# Patient Record
Sex: Female | Born: 1980
Health system: Southern US, Community
[De-identification: ages and names within clinical notes are randomized; demographics above are authoritative.]

## PROBLEM LIST (undated history)

## (undated) DIAGNOSIS — M2550 Pain in unspecified joint: Secondary | ICD-10-CM

## (undated) DIAGNOSIS — R079 Chest pain, unspecified: Secondary | ICD-10-CM

## (undated) DIAGNOSIS — I10 Essential (primary) hypertension: Secondary | ICD-10-CM

## (undated) DIAGNOSIS — E559 Vitamin D deficiency, unspecified: Secondary | ICD-10-CM

## (undated) DIAGNOSIS — G35D Multiple sclerosis, unspecified: Secondary | ICD-10-CM

## (undated) DIAGNOSIS — R1310 Dysphagia, unspecified: Secondary | ICD-10-CM

## (undated) DIAGNOSIS — Z9289 Personal history of other medical treatment: Secondary | ICD-10-CM

## (undated) DIAGNOSIS — N979 Female infertility, unspecified: Secondary | ICD-10-CM

## (undated) DIAGNOSIS — D649 Anemia, unspecified: Secondary | ICD-10-CM

## (undated) DIAGNOSIS — R0602 Shortness of breath: Secondary | ICD-10-CM

## (undated) DIAGNOSIS — R600 Localized edema: Secondary | ICD-10-CM

## (undated) DIAGNOSIS — I509 Heart failure, unspecified: Secondary | ICD-10-CM

## (undated) DIAGNOSIS — J189 Pneumonia, unspecified organism: Secondary | ICD-10-CM

## (undated) DIAGNOSIS — G35 Multiple sclerosis: Secondary | ICD-10-CM

## (undated) HISTORY — DX: Female infertility, unspecified: N97.9

## (undated) HISTORY — DX: Pain in unspecified joint: M25.50

## (undated) HISTORY — DX: Chest pain, unspecified: R07.9

## (undated) HISTORY — DX: Vitamin D deficiency, unspecified: E55.9

## (undated) HISTORY — DX: Shortness of breath: R06.02

## (undated) HISTORY — DX: Dysphagia, unspecified: R13.10

## (undated) HISTORY — DX: Localized edema: R60.0

---

## 2015-05-16 ENCOUNTER — Other Ambulatory Visit (HOSPITAL_COMMUNITY): Payer: Self-pay | Admitting: Psychiatry

## 2015-05-16 DIAGNOSIS — G35 Multiple sclerosis: Secondary | ICD-10-CM

## 2015-05-29 ENCOUNTER — Ambulatory Visit (HOSPITAL_COMMUNITY)
Admission: RE | Admit: 2015-05-29 | Discharge: 2015-05-29 | Disposition: A | Discharge status: Home / Self Care | Payer: PRIVATE HEALTH INSURANCE | Source: Ambulatory Visit | Attending: Psychiatry | Admitting: Psychiatry

## 2015-05-29 ENCOUNTER — Encounter (HOSPITAL_COMMUNITY): Payer: Self-pay

## 2015-05-29 DIAGNOSIS — G35 Multiple sclerosis: Secondary | ICD-10-CM | POA: Diagnosis not present

## 2015-05-29 HISTORY — DX: Essential (primary) hypertension: I10

## 2015-05-29 LAB — CREATININE, SERUM
Creatinine, Ser: 0.71 mg/dL (ref 0.44–1.00)
GFR calc Af Amer: >60 mL/min (ref >60)
GFR calc non Af Amer: >60 mL/min (ref >60)

## 2015-05-29 MED ORDER — GADOBENATE DIMEGLUMINE 529 MG/ML IV SOLN
20.0000 mL | Freq: Once | INTRAVENOUS | Status: AC | PRN
  Administered 2015-05-29: 20 mL via INTRAVENOUS

## 2017-08-12 DIAGNOSIS — Z9289 Personal history of other medical treatment: Secondary | ICD-10-CM

## 2017-08-12 HISTORY — DX: Personal history of other medical treatment: Z92.89

## 2017-09-04 ENCOUNTER — Other Ambulatory Visit: Payer: Self-pay

## 2017-09-04 ENCOUNTER — Inpatient Hospital Stay (HOSPITAL_COMMUNITY)
Admission: EM | Admit: 2017-09-04 | Discharge: 2017-09-07 | DRG: 760 | Disposition: A | Discharge status: Home / Self Care | Payer: PRIVATE HEALTH INSURANCE | Attending: Family Medicine | Admitting: Family Medicine

## 2017-09-04 ENCOUNTER — Encounter (HOSPITAL_COMMUNITY): Payer: Self-pay

## 2017-09-04 DIAGNOSIS — D259 Leiomyoma of uterus, unspecified: Secondary | ICD-10-CM | POA: Diagnosis not present

## 2017-09-04 DIAGNOSIS — Z833 Family history of diabetes mellitus: Secondary | ICD-10-CM

## 2017-09-04 DIAGNOSIS — F419 Anxiety disorder, unspecified: Secondary | ICD-10-CM | POA: Diagnosis present

## 2017-09-04 DIAGNOSIS — D649 Anemia, unspecified: Secondary | ICD-10-CM | POA: Diagnosis present

## 2017-09-04 DIAGNOSIS — Z6841 Body Mass Index (BMI) 40.0 and over, adult: Secondary | ICD-10-CM

## 2017-09-04 DIAGNOSIS — N920 Excessive and frequent menstruation with regular cycle: Secondary | ICD-10-CM

## 2017-09-04 DIAGNOSIS — Z8249 Family history of ischemic heart disease and other diseases of the circulatory system: Secondary | ICD-10-CM

## 2017-09-04 DIAGNOSIS — I10 Essential (primary) hypertension: Secondary | ICD-10-CM | POA: Diagnosis present

## 2017-09-04 DIAGNOSIS — N924 Excessive bleeding in the premenopausal period: Secondary | ICD-10-CM

## 2017-09-04 DIAGNOSIS — Z79899 Other long term (current) drug therapy: Secondary | ICD-10-CM

## 2017-09-04 DIAGNOSIS — G35 Multiple sclerosis: Secondary | ICD-10-CM | POA: Diagnosis not present

## 2017-09-04 DIAGNOSIS — R000 Tachycardia, unspecified: Secondary | ICD-10-CM | POA: Diagnosis present

## 2017-09-04 DIAGNOSIS — D500 Iron deficiency anemia secondary to blood loss (chronic): Secondary | ICD-10-CM | POA: Diagnosis present

## 2017-09-04 HISTORY — DX: Multiple sclerosis: G35

## 2017-09-04 HISTORY — DX: Multiple sclerosis, unspecified: G35.D

## 2017-09-04 LAB — CBC
HCT: 24.5 % — ABNORMAL LOW (ref 36.0–46.0)
Hemoglobin: 6.1 g/dL — CL (ref 12.0–15.0)
MCH: 15.4 pg — ABNORMAL LOW (ref 26.0–34.0)
MCHC: 24.9 g/dL — ABNORMAL LOW (ref 30.0–36.0)
MCV: 61.9 fL — ABNORMAL LOW (ref 78.0–100.0)
Platelets: 240 10*3/uL (ref 150–400)
RBC: 3.96 MIL/uL (ref 3.87–5.11)
RDW: 19.7 % — ABNORMAL HIGH (ref 11.5–15.5)
WBC: 5.2 10*3/uL (ref 4.0–10.5)

## 2017-09-04 LAB — COMPREHENSIVE METABOLIC PANEL
ALT: 21 U/L (ref 14–54)
AST: 31 U/L (ref 15–41)
Albumin: 3.7 g/dL (ref 3.5–5.0)
Alkaline Phosphatase: 36 U/L — ABNORMAL LOW (ref 38–126)
Anion gap: 10 (ref 5–15)
BUN: 13 mg/dL (ref 6–20)
CO2: 20 mmol/L — ABNORMAL LOW (ref 22–32)
Calcium: 8.9 mg/dL (ref 8.9–10.3)
Chloride: 108 mmol/L (ref 101–111)
Creatinine, Ser: 0.69 mg/dL (ref 0.44–1.00)
GFR calc Af Amer: >60 mL/min (ref >60)
GFR calc non Af Amer: >60 mL/min (ref >60)
Glucose, Bld: 85 mg/dL (ref 65–99)
Potassium: 3.9 mmol/L (ref 3.5–5.1)
Sodium: 138 mmol/L (ref 135–145)
Total Bilirubin: 0.6 mg/dL (ref 0.3–1.2)
Total Protein: 7.7 g/dL (ref 6.5–8.1)

## 2017-09-04 LAB — PREPARE RBC (CROSSMATCH)

## 2017-09-04 LAB — I-STAT BETA HCG BLOOD, ED (MC, WL, AP ONLY): I-stat hCG, quantitative: <5 m[IU]/mL (ref <5)

## 2017-09-04 LAB — ABO/RH: ABO/RH(D): A POS

## 2017-09-04 LAB — POC OCCULT BLOOD, ED: Fecal Occult Bld: NEGATIVE

## 2017-09-04 MED ORDER — SODIUM CHLORIDE 0.9 % IV SOLN
10.0000 mL/h | Freq: Once | INTRAVENOUS | Status: AC
  Administered 2017-09-04: 10 mL/h via INTRAVENOUS

## 2017-09-04 NOTE — H&P (Signed)
[facility] Admission History and Physical Service Pager: [phone]  Patient name: Amanda [name] Medical record number: [id number] Date of birth: 1981/01/26 Age: 37 y.o. Gender: female  Primary Care Provider: Derek Jack, MD (Inactive) Consultants: none Code Status: full  Chief Complaint: symptomatic anemia  Assessment and Plan: Amanda [name] is a 37 y.o. female presenting with symptomatic anemia with a hemoglobin of 6.1. PMH is significant for multiple sclerosis, obesity.  Symptomatic microcytic anemia: Patient found to have Hgb 6.1 at routine clinic visit and sent to ED.  She has had fatigue for the past month and reports menorrhagia last month.  Unlikely GI source as FOBT negative.  No falls or pain to suggest occult intraabdominal source. Patient has a sister who has had menorrhagia and early hysterectomy, unclear if due to fibroids or other cause.  Patient's last pap smear was two years ago and was normal.  She denies history of abnormal pap smears.  Pelvic exam negative for vaginal lacerations, although cervix was not visualized due to patient intolerance. If additional pelvic exam or [place] is needed, patient may require premedication with benzodiazepines. No blood in vaginal vault.  Given patient's age and menorrhagia, likely source of anemia is uterine bleeding likely due to fibroids, although adenomyosis or a cervical lesion are also possible.  Low MCV supports chronic iron-deficiency anemia.  No pancytopenia to suggest bone marrow dysfunction. -observe on med surg, Dr. Mingo Amber attending - transfuse one unit pRBC - f/u post-transfusion CBC - iron panel  - ferritin  - TSH due to tachycardia and recent weight loss, although anemia could cause tachycardia and weight loss was intentional - consider pelvic US to assess fibroids and endometrial stripe - consider starting OCPs vs megace - Fe supplementation with bowel regimen at discharge  Multiple Sclerosis:  Patient was diagnosed in 2009 and sees Dr. Baird Kay, next appt in March. Last relapse was in 2016.  Patient currently experiences some dysarthria, joint pain, and tingling in her lower extremities.  Takes teriflunomide (Aubagio) 14 mg daily. - continue Aubagio   FEN/GI: regular diet Prophylaxis: lovenox  Disposition: admit to med-surg, attending Dr. Mingo Amber  History of Present Illness:  Amanda [name] is a 37 y.o. female presenting with symptomatic anemia.  She reports a history of fatigue for the past three months, with some dizziness causing her to take breaks at work.  She also notes that her previous period was much heavier than normal, with patient needing to use 4 super pads in one day and having gushing with urination.  This period also lasted longer than normal at 6-7 days.  She also says that she experienced significant cramping with her last period.  Usually, the first 1-2 days of her periods are heavy, but they are light after that.  Her periods are regular and occur at monthly intervals.  They have been normal except for her last one.  She does not take birth control because she was told that her husband was not able to have children.  She says that she has been diagnosed with anemia before and was prescribed iron supplements, but she stopped taking them after they made her constipated.  Her last pap smear was two years ago and was normal, as have been all of her previous pap smears.  She says that her weight has decreased from 336 lbs to 290 due to better control of portion sizes.   Review Of Systems: Per HPI with the following additions:   Review of Systems  Constitutional: Positive for malaise/fatigue and weight loss. Negative for chills.  HENT: Negative for congestion.   Eyes: Negative for double vision.  Respiratory: Negative for cough.   Cardiovascular: Positive for chest pain and palpitations.       Chest pain is right sided and related to arm movement and deep breathing   Gastrointestinal: Positive for constipation. Negative for abdominal pain, diarrhea, melena, nausea and vomiting.  Genitourinary: Negative for dysuria.  Musculoskeletal: Negative for falls and myalgias.  Neurological: Positive for dizziness. Negative for headaches.  Endo/Heme/Allergies: Does not bruise/bleed easily.       Endorses frequently feeling cold     Patient Active Problem List   Diagnosis Date Noted  . Symptomatic anemia   . Multiple sclerosis ([place])   . Morbid obesity ([place])   . Excessive bleeding in premenopausal period     Past Medical History: Past Medical History:  Diagnosis Date  . BP (high blood pressure)   . Multiple sclerosis ([place])     Past Surgical History: History reviewed. No pertinent surgical history.  Social History: Social History   Tobacco Use  . Smoking status: Never Smoker  . Smokeless tobacco: Never Used  Substance Use Topics  . Alcohol use: No    Frequency: Never  . Drug use: No   Additional social history: lives with husband  Please also refer to relevant sections of EMR.  Family History: Family History  Problem Relation Age of Onset  . Diabetes Mother   . Hypertension Mother   . Fibroids Sister    (If not completed, MUST add something in)  Allergies and Medications: No Known Allergies No current facility-administered medications on file prior to encounter.    Current Outpatient Medications on File Prior to Encounter  Medication Sig Dispense Refill  . Teriflunomide (AUBAGIO) 14 MG TABS Take 14 mg by mouth daily.      Objective: BP 117/67   Pulse 93   Temp 98.7 F (37.1 C)   Resp 16   LMP 08/15/2017 (Within Days)   SpO2 100%  Physical Exam  Constitutional: She is oriented to person, place, and time. She appears well-developed and well-nourished. No distress.  HENT:  Head: Normocephalic and atraumatic.  Nose: Nose normal.  Eyes: Conjunctivae and EOM are normal.  Neck: Normal range of motion.  Cardiovascular: Normal  rate, regular rhythm and normal heart sounds.  No murmur heard. Pulmonary/Chest: Effort normal and breath sounds normal. No respiratory distress.  Abdominal: Soft. Bowel sounds are normal. There is no tenderness.  Genitourinary: Vagina normal and uterus normal. No bleeding in the vagina. No vaginal discharge found.  Genitourinary Comments: No vaginal lacerations or blood in vaginal vault; could not visualize cervix due to patient intolerance; no masses felt on manual exam but limited by obesity  Lymphadenopathy:    She has no cervical adenopathy.  Neurological: She is alert and oriented to person, place, and time. No cranial nerve deficit. Coordination normal.  Skin: Skin is warm and dry. She is not diaphoretic. There is pallor.  Psychiatric: She has a normal mood and affect. Her behavior is normal. Judgment and thought content normal.  Very anxious during pelvic exam    Labs and Imaging: CBC BMET  Recent Labs  Lab 09/04/17 1904  WBC 5.2  HGB 6.1*  HCT 24.5*  PLT 240   Recent Labs  Lab 09/04/17 1904  NA 138  K 3.9  CL 108  CO2 20*  BUN 13  CREATININE 0.69  GLUCOSE 85  CALCIUM  8.9      Kathrene Alu, MD 09/05/2017, 2:31 AM PGY-1, [place] Intern pager: [phone], text pages welcome  FPTS Upper-Level Resident Addendum  I have independently interviewed and examined the patient. I have discussed the above with the original author and agree with their documentation. My edits for correction/addition/clarification are in blue. Please see also any attending notes.   Ralene Ok, MD PGY-2, [place] Service pager: [phone] (text pages welcome through [facility])

## 2017-09-04 NOTE — ED Provider Notes (Signed)
I saw and evaluated the patient, reviewed the resident's note and I agree with the findings and plan.  Pertinent History: fatigue over several months - routine physical with labs - told she had very low hgb and to come to hospital.  She has had hgb of 6 - no hx of GI bleeding - has had a couple of months of heavy menses.  Pertinent Exam findings: tachycardic, no abd ttp, lungs clear, no edema.  Obese  Needs admission for transfusion  The patient has required a blood transfusion in the emergency department for ongoing tachycardia and a severe anemia.  Critical care provided for this very anemic patient.  She will be admitted to the hospital under the medical service, the physicians are at the bedside at this time.  Rectal exam showed no gross blood per resident note.  .Critical Care Performed by: Noemi Chapel, MD Authorized by: Noemi Chapel, MD   Critical care provider statement:    Critical care time (minutes):  35   Critical care time was exclusive of:  Separately billable procedures and treating other patients and teaching time   Critical care was necessary to treat or prevent imminent or life-threatening deterioration of the following conditions: severe anemia.   Critical care was time spent personally by me on the following activities:  Blood draw for specimens, development of treatment plan with patient or surrogate, discussions with consultants, evaluation of patient's response to treatment, examination of patient, obtaining history from patient or surrogate, ordering and performing treatments and interventions, ordering and review of laboratory studies, ordering and review of radiographic studies, pulse oximetry, re-evaluation of patient's condition and review of old charts     Final diagnoses:  Severe anemia      Noemi Chapel, MD 09/05/17 1031

## 2017-09-04 NOTE — ED Triage Notes (Addendum)
Pt states she was called by her PCP and told to come to ED for low hgb. Pt states provider reported hgb of 6. She states increased fatigue. Denies any vaginal bleeding or rectal bleeding. Pt noted to be pale and have pale membranes. Pt does report her most recent menstrual cycle was heavier than normal. Pt also states she has had recent weight loss, approximately 40lbs in year.

## 2017-09-04 NOTE — ED Provider Notes (Signed)
[facility] EMERGENCY DEPARTMENT Provider Note   CSN: [id number] Arrival date & time: 09/04/17  1833     History   Chief Complaint Chief Complaint  Patient presents with  . Abnormal Lab    HPI Amanda [name] is a 37 y.o. female.  Amanda [name] presents after being called back by her doctor for critically low hemoglobin value of 6.  She has been symptomatic for the last 3 months with increased fatigue.  She has had heavy menstrual periods for the past 2 cycles.  The last cycle ended January 4 and had 7 days of heavy bleeding.  She has had no bleeding since then.      Past Medical History:  Diagnosis Date  . BP (high blood pressure)   . Multiple sclerosis ([place])     There are no active problems to display for this patient.   History reviewed. No pertinent surgical history.  OB History    No data available       Home Medications    Prior to Admission medications   Medication Sig Start Date End Date Taking? Authorizing Provider  Teriflunomide (AUBAGIO) 14 MG TABS Take 14 mg by mouth daily.   Yes [provider]    Family History History reviewed. No pertinent family history.  Social History Social History   Tobacco Use  . Smoking status: Never Smoker  . Smokeless tobacco: Never Used  Substance Use Topics  . Alcohol use: No    Frequency: Never  . Drug use: No     Allergies   Patient has no known allergies.   Review of Systems Review of Systems  Constitutional: Negative for chills and fever.  HENT: Negative for ear pain and sore throat.   Eyes: Negative for pain and visual disturbance.  Respiratory: Negative for cough and shortness of breath.   Cardiovascular: Negative for chest pain and palpitations.  Gastrointestinal: Negative for abdominal pain and vomiting.  Genitourinary: Negative for dysuria, hematuria and vaginal bleeding (currently).  Musculoskeletal: Negative for arthralgias and back pain.  Skin: Negative for color  change and rash.  Neurological: Negative for seizures and syncope.  All other systems reviewed and are negative.    Physical Exam Updated Vital Signs BP 140/86 (BP Location: Right Arm)   Pulse (!) 111   Temp 98.9 F (37.2 C) (Oral)   LMP 08/15/2017 (Within Days)   SpO2 100%   Physical Exam  Constitutional: She is oriented to person, place, and time. She appears well-developed and well-nourished. No distress.  HENT:  Head: Normocephalic and atraumatic.  Eyes: Conjunctivae and EOM are normal. Pupils are equal, round, and reactive to light.  Neck: Neck supple.  Cardiovascular: Regular rhythm.  No murmur heard. Tachycardia  Pulmonary/Chest: Effort normal and breath sounds normal. No respiratory distress.  Abdominal: Soft. There is no tenderness.  Musculoskeletal: She exhibits no edema.  Neurological: She is alert and oriented to person, place, and time.  Skin: Skin is warm and dry. There is pallor.  Psychiatric: She has a normal mood and affect.  Nursing note and vitals reviewed.    ED Treatments / Results  Labs (all labs ordered are listed, but only abnormal results are displayed) Labs Reviewed  COMPREHENSIVE METABOLIC PANEL - Abnormal; Notable for the following components:      Result Value   CO2 20 (*)    Alkaline Phosphatase 36 (*)    All other components within normal limits  CBC - Abnormal; Notable for the following components:  Hemoglobin 6.1 (*)    HCT 24.5 (*)    MCV 61.9 (*)    MCH 15.4 (*)    MCHC 24.9 (*)    RDW 19.7 (*)    All other components within normal limits  I-STAT BETA HCG BLOOD, ED ([facility], [facility], [facility] ONLY)  POC OCCULT BLOOD, ED  TYPE AND SCREEN  ABO/RH    EKG  EKG Interpretation None       Radiology No results found.  Procedures Procedures (including critical care time)  Medications Ordered in ED Medications  0.9 %  sodium chloride infusion (10 mL/hr Intravenous New Bag/Given 09/04/17 2200)     Initial Impression / Assessment and  Plan / ED Course  I have reviewed the triage vital signs and the nursing notes.  Pertinent labs & imaging results that were available during my care of the patient were reviewed by me and considered in my medical decision making (see chart for details).     Amanda [name] is a 37 year old female with past medical history significant for multiple sclerosis and hypertension who presents for anemia and fatigue.  Labs obtained and significant for hemoglobin of 6.1.  Fecal occult is negative.  Patient was transfused 1 unit packed red blood cells.  Patient is admitted to family medicine.  Final Clinical Impressions(s) / ED Diagnoses   Final diagnoses:  Severe anemia    ED Discharge Orders    None       Elveria Rising, MD 09/05/17 [place]    Noemi Chapel, MD 09/05/17 1030

## 2017-09-05 ENCOUNTER — Encounter (HOSPITAL_COMMUNITY): Payer: Self-pay | Admitting: Family Medicine

## 2017-09-05 ENCOUNTER — Other Ambulatory Visit: Payer: Self-pay

## 2017-09-05 DIAGNOSIS — N920 Excessive and frequent menstruation with regular cycle: Secondary | ICD-10-CM | POA: Diagnosis not present

## 2017-09-05 DIAGNOSIS — D649 Anemia, unspecified: Secondary | ICD-10-CM | POA: Diagnosis not present

## 2017-09-05 LAB — FERRITIN: Ferritin: 2 ng/mL — ABNORMAL LOW (ref 11–307)

## 2017-09-05 LAB — CBC
HCT: 24.6 % — ABNORMAL LOW (ref 36.0–46.0)
HCT: 27.9 % — ABNORMAL LOW (ref 36.0–46.0)
Hemoglobin: 6.4 g/dL — CL (ref 12.0–15.0)
Hemoglobin: 7.7 g/dL — ABNORMAL LOW (ref 12.0–15.0)
MCH: 16.5 pg — ABNORMAL LOW (ref 26.0–34.0)
MCH: 18.2 pg — ABNORMAL LOW (ref 26.0–34.0)
MCHC: 26 g/dL — ABNORMAL LOW (ref 30.0–36.0)
MCHC: 27.6 g/dL — ABNORMAL LOW (ref 30.0–36.0)
MCV: 63.6 fL — ABNORMAL LOW (ref 78.0–100.0)
MCV: 66 fL — ABNORMAL LOW (ref 78.0–100.0)
Platelets: 190 10*3/uL (ref 150–400)
Platelets: 197 10*3/uL (ref 150–400)
RBC: 3.87 MIL/uL (ref 3.87–5.11)
RBC: 4.23 MIL/uL (ref 3.87–5.11)
RDW: 21.4 % — ABNORMAL HIGH (ref 11.5–15.5)
RDW: 23.6 % — ABNORMAL HIGH (ref 11.5–15.5)
WBC: 5.3 10*3/uL (ref 4.0–10.5)
WBC: 4.8 10*3/uL (ref 4.0–10.5)

## 2017-09-05 LAB — BASIC METABOLIC PANEL
Anion gap: 8 (ref 5–15)
BUN: 10 mg/dL (ref 6–20)
CO2: 21 mmol/L — ABNORMAL LOW (ref 22–32)
Calcium: 8.7 mg/dL — ABNORMAL LOW (ref 8.9–10.3)
Chloride: 108 mmol/L (ref 101–111)
Creatinine, Ser: 0.63 mg/dL (ref 0.44–1.00)
GFR calc Af Amer: >60 mL/min (ref >60)
GFR calc non Af Amer: >60 mL/min (ref >60)
Glucose, Bld: 91 mg/dL (ref 65–99)
Potassium: 3.7 mmol/L (ref 3.5–5.1)
Sodium: 137 mmol/L (ref 135–145)

## 2017-09-05 LAB — IRON AND TIBC
Iron: 31 ug/dL (ref 28–170)
Saturation Ratios: 7 % — ABNORMAL LOW (ref 10.4–31.8)
TIBC: 458 ug/dL — ABNORMAL HIGH (ref 250–450)
UIBC: 427 ug/dL

## 2017-09-05 LAB — HIV ANTIBODY (ROUTINE TESTING W REFLEX): HIV Screen 4th Generation wRfx: NONREACTIVE

## 2017-09-05 LAB — TSH: TSH: 2.455 u[IU]/mL (ref 0.350–4.500)

## 2017-09-05 LAB — PREPARE RBC (CROSSMATCH)

## 2017-09-05 MED ORDER — TERIFLUNOMIDE 14 MG PO TABS
14.0000 mg | ORAL_TABLET | Freq: Every day | ORAL | Status: DC
  Administered 2017-09-06: 14 mg via ORAL

## 2017-09-05 MED ORDER — DIAZEPAM 2 MG PO TABS
2.0000 mg | ORAL_TABLET | Freq: Once | ORAL | Status: AC
  Administered 2017-09-05: 2 mg via ORAL
  Filled 2017-09-05: qty 1

## 2017-09-05 MED ORDER — SODIUM CHLORIDE 0.9 % IV SOLN
Freq: Once | INTRAVENOUS | Status: AC
  Administered 2017-09-05: 05:00:00 via INTRAVENOUS

## 2017-09-05 MED ORDER — ENOXAPARIN SODIUM 40 MG/0.4ML ~~LOC~~ SOLN: 40.0000 mg | SUBCUTANEOUS | Status: DC

## 2017-09-05 NOTE — ED Notes (Signed)
MD paged for post infusion Hemoglobin of 6.4

## 2017-09-05 NOTE — Progress Notes (Signed)
Family Medicine Teaching Service Daily Progress Note Intern Pager: [phone]  Patient name: Amanda [name] Medical record number: [id number] Date of birth: May 20, 1981 Age: 37 y.o. Gender: female  Primary Care Provider: Derek Jack, MD (Inactive) Consultants: none Code Status: full  Pt Overview and Major Events to Date:  Admitted 1/24, received two units pRBC  Assessment and Plan:  Amanda [name] is a 37 y.o. female presenting with symptomatic anemia with a hemoglobin of 6.1. PMH is significant for multiple sclerosis, obesity.  Symptomatic microcytic anemia: Patient found to have Hgb 6.1 at routine clinic visit and sent to ED.  She has had fatigue for the past month and reports menorrhagia last month.  Unlikely GI source as FOBT negative.  No falls or pain to suggest occult intraabdominal source. Patient has a sister who has had menorrhagia and early hysterectomy, unclear if due to fibroids or other cause.  Patient's last pap smear was two years ago and was normal.  She denies history of abnormal pap smears.  Pelvic exam negative for vaginal lacerations, although cervix was not visualized due to patient intolerance. If additional pelvic exam or [place] is needed, patient may require premedication with benzodiazepines. No blood in vaginal vault.  Given patient's age and menorrhagia, likely source of anemia is uterine bleeding likely due to fibroids, although adenomyosis or a cervical lesion are also possible.  Low MCV supports chronic iron-deficiency anemia.  No pancytopenia to suggest bone marrow dysfunction.  Hgb only modestly improved after first unit, so a second unit was transfused at around 0500 on 1/25.  After second transfusion, Hgb was 7.7. - iron panel  - ferritin  - TSH due to tachycardia and recent weight loss, although anemia could cause tachycardia and weight loss was intentional - repeat pelvic exam before attempting transvaginal US - vaginal [place] to assess fibroids and endometrial stripe -  consider starting OCPs vs megace depending on results of [place] - Fe supplementation with bowel regimen at discharge - consider anxiolytic before any pelvic exam or ultrasound is performed  Multiple Sclerosis: Patient was diagnosed in 2009 and sees Dr. Baird Kay, next appt in March. Last relapse was in 2016.  Patient currently experiences some dysarthria, joint pain, and tingling in her lower extremities.  Takes teriflunomide (Aubagio) 14 mg daily. - continue Aubagio   FEN/GI: regular diet PPx: none due to significant anemia and frequent ambulation  Disposition: home  Subjective:  Patient says she is feeling better after her second transfusion.  She is amenable to ultrasound if that is done today.  I told her that we will likely be able to give her an anxiolytic prior to the ultrasound if she requests.  Objective: Temp:  [98.1 F (36.7 C)-98.9 F (37.2 C)] 98.1 F (36.7 C) (01/25 0654) Pulse Rate:  [85-111] 90 (01/25 0654) Resp:  [15-24] 18 (01/25 0654) BP: (99-140)/(54-86) 111/76 (01/25 0654) SpO2:  [96 %-100 %] 99 % (01/25 0654) Physical Exam  Constitutional: She is oriented to person, place, and time. She appears well-developed and well-nourished. No distress.  HENT:  Head: Normocephalic and atraumatic.  Eyes: Conjunctivae and EOM are normal.  Neck: Normal range of motion.  Cardiovascular: Normal rate and regular rhythm.  Pulmonary/Chest: Effort normal and breath sounds normal.  Abdominal: Soft. Bowel sounds are normal.  Musculoskeletal: Normal range of motion.  Neurological: She is alert and oriented to person, place, and time.  Skin: Skin is warm and dry. There is pallor.  Psychiatric: She has a normal mood and affect. Her  behavior is normal.    Laboratory: Recent Labs  Lab 09/04/17 1904 09/05/17 0318  WBC 5.2 5.3  HGB 6.1* 6.4*  HCT 24.5* 24.6*  PLT 240 190   Recent Labs  Lab 09/04/17 1904 09/05/17 0318  NA 138 137  K 3.9 3.7  CL 108 108  CO2 20* 21*   BUN 13 10  CREATININE 0.69 0.63  CALCIUM 8.9 8.7*  PROT 7.7  --   BILITOT 0.6  --   ALKPHOS 36*  --   ALT 21  --   AST 31  --   GLUCOSE 85 91    Imaging/Diagnostic Tests: none  Kathrene Alu, MD 09/05/2017, 7:26 AM PGY-1, [place] Intern pager: [phone], text pages welcome

## 2017-09-05 NOTE — Progress Notes (Signed)
FPTS Interim Progress Note  BP 125/74   Pulse (!) 115   Temp 98.9 F (37.2 C) (Oral)   Resp 20   LMP 08/15/2017 (Within Days)   SpO2 100%    Patient given Valium 2mg  at 11:30 due to anxiety leading to incomplete pelvic exam at admission and need for repeat exam. Patient sleepy at bedside around 14:30. She is conversant. Patient able to tolerate insertion of speculum but still quite tense. No masses visualized in vaginal vault. Cervix visualized without lesions or abnormalities. No blood visualized or on glove. Will proceed with ordering pelvic ultrasound to assess for fibroid uterus or adenomyosis due to history of heavy menstrual periods and admission for symptomatic anemia. Will give another dose of Valium prior to ultrasound.   Nicolette Bang, DO 09/05/2017, 3:05 PM PGY-3, [place] Medicine Service pager [phone]

## 2017-09-05 NOTE — Progress Notes (Signed)
Got supplies to do pelvic exam. MD aware.

## 2017-09-05 NOTE — ED Notes (Signed)
Report Called to [employer] on 3W.

## 2017-09-05 NOTE — ED Notes (Signed)
Pt ambulated to bathroom without assistance, pt reports feeling much better

## 2017-09-06 ENCOUNTER — Observation Stay (HOSPITAL_COMMUNITY): Payer: PRIVATE HEALTH INSURANCE

## 2017-09-06 DIAGNOSIS — F419 Anxiety disorder, unspecified: Secondary | ICD-10-CM | POA: Diagnosis present

## 2017-09-06 DIAGNOSIS — Z8249 Family history of ischemic heart disease and other diseases of the circulatory system: Secondary | ICD-10-CM | POA: Diagnosis not present

## 2017-09-06 DIAGNOSIS — D259 Leiomyoma of uterus, unspecified: Secondary | ICD-10-CM | POA: Diagnosis present

## 2017-09-06 DIAGNOSIS — I10 Essential (primary) hypertension: Secondary | ICD-10-CM | POA: Diagnosis present

## 2017-09-06 DIAGNOSIS — D500 Iron deficiency anemia secondary to blood loss (chronic): Secondary | ICD-10-CM | POA: Diagnosis present

## 2017-09-06 DIAGNOSIS — Z833 Family history of diabetes mellitus: Secondary | ICD-10-CM | POA: Diagnosis not present

## 2017-09-06 DIAGNOSIS — G35 Multiple sclerosis: Secondary | ICD-10-CM | POA: Diagnosis present

## 2017-09-06 DIAGNOSIS — Z79899 Other long term (current) drug therapy: Secondary | ICD-10-CM | POA: Diagnosis not present

## 2017-09-06 DIAGNOSIS — Z6841 Body Mass Index (BMI) 40.0 and over, adult: Secondary | ICD-10-CM | POA: Diagnosis not present

## 2017-09-06 DIAGNOSIS — N920 Excessive and frequent menstruation with regular cycle: Secondary | ICD-10-CM | POA: Diagnosis present

## 2017-09-06 DIAGNOSIS — D649 Anemia, unspecified: Secondary | ICD-10-CM | POA: Diagnosis present

## 2017-09-06 DIAGNOSIS — R000 Tachycardia, unspecified: Secondary | ICD-10-CM | POA: Diagnosis present

## 2017-09-06 LAB — CBC
HCT: 26.5 % — ABNORMAL LOW (ref 36.0–46.0)
Hemoglobin: 7.1 g/dL — ABNORMAL LOW (ref 12.0–15.0)
MCH: 17.7 pg — ABNORMAL LOW (ref 26.0–34.0)
MCHC: 26.8 g/dL — ABNORMAL LOW (ref 30.0–36.0)
MCV: 65.9 fL — ABNORMAL LOW (ref 78.0–100.0)
Platelets: 187 10*3/uL (ref 150–400)
RBC: 4.02 MIL/uL (ref 3.87–5.11)
RDW: 22.9 % — ABNORMAL HIGH (ref 11.5–15.5)
WBC: 4 10*3/uL (ref 4.0–10.5)

## 2017-09-06 LAB — PREPARE RBC (CROSSMATCH)

## 2017-09-06 MED ORDER — POLYETHYLENE GLYCOL 3350 17 G PO PACK
17.0000 g | PACK | Freq: Every day | ORAL | Status: DC
  Filled 2017-09-06 (×2): qty 1

## 2017-09-06 MED ORDER — FERROUS SULFATE 325 (65 FE) MG PO TABS
325.0000 mg | ORAL_TABLET | Freq: Every day | ORAL | Status: DC
  Administered 2017-09-06 – 2017-09-07 (×2): 325 mg via ORAL
  Filled 2017-09-06 (×2): qty 1

## 2017-09-06 MED ORDER — SODIUM CHLORIDE 0.9 % IV SOLN
Freq: Once | INTRAVENOUS | Status: AC
  Administered 2017-09-06: 15:00:00 via INTRAVENOUS

## 2017-09-06 MED ORDER — ACETAMINOPHEN 325 MG PO TABS
650.0000 mg | ORAL_TABLET | Freq: Four times a day (QID) | ORAL | Status: DC | PRN
  Administered 2017-09-06: 650 mg via ORAL
  Filled 2017-09-06: qty 2

## 2017-09-06 MED ORDER — TERIFLUNOMIDE 14 MG PO TABS
14.0000 mg | ORAL_TABLET | Freq: Every day | ORAL | Status: DC
  Administered 2017-09-07: 14 mg via ORAL
  Filled 2017-09-06 (×2): qty 14

## 2017-09-06 NOTE — Progress Notes (Signed)
Family Medicine Teaching Service Daily Progress Note Intern Pager: [phone]  Patient name: Amanda [name] Medical record number: [id number] Date of birth: May 07, 1981 Age: 37 y.o. Gender: female  Primary Care Provider: Derek Jack, MD (Inactive) Consultants: none Code Status: full  Pt Overview and Major Events to Date:  Admitted 1/24, received two units pRBC 1/25, unremarkable pelvic exam with cervix visualized  Assessment and Plan:  Amanda [name] is a 37 y.o. female presenting with symptomatic anemia with a hemoglobin of 6.1. PMH is significant for multiple sclerosis, obesity.  Symptomatic microcytic anemia: Patient with recent history of fatigue found to have Hgb 6.1 at routine clinic visit and sent to ED.  Given patient's age and menorrhagia, likely source of anemia is uterine bleeding likely due to fibroids, although adenomyosis or a cervical lesion are also possible.  Low MCV supports chronic iron-deficiency anemia.  No pancytopenia to suggest bone marrow dysfunction.  Hgb only modestly improved after first unit, so a second unit was transfused at around 0500 on 1/25.  After second transfusion, Hgb was 7.7.  Iron panel consistent with iron-deficiency anemia, with elevated TIBC, very low ferritin, Fe on low end of normal.  TSH wnl.  Hgb 7.1 on 1/26. - pelvic and vaginal [place] to assess fibroids and endometrial stripe - consider starting OCPs vs megace depending on results of [place] - start Fe supplementation with Vitamin C and bowel regimen (will add vitamin C at discharge) - recheck CBC on 1/27 due to drop in Hgb over the last 24 hours  Multiple Sclerosis: Patient was diagnosed in 2009 and sees Dr. Baird Kay, next appt in March. Last relapse was in 2016.  Patient currently experiences some dysarthria, joint pain, and tingling in her lower extremities.  Takes teriflunomide (Aubagio) 14 mg daily. - continue Aubagio   FEN/GI: regular diet PPx: none due to significant anemia and frequent  ambulation  Disposition: home  Subjective:  Unable to speak with patient since she was getting ultrasound.  Her husband says patient has had no complaints overnight.  Objective: Temp:  [97.6 F (36.4 C)-98.9 F (37.2 C)] 98.5 F (36.9 C) (01/26 0515) Pulse Rate:  [89-115] 89 (01/26 0515) Resp:  [18-20] 20 (01/26 0515) BP: (111-125)/(64-76) 123/74 (01/26 0515) SpO2:  [98 %-100 %] 100 % (01/26 0515) Weight:  [295 lb (133.8 kg)] 295 lb (133.8 kg) (01/25 1712)  Unable to perform physical exam due to patient getting [place]  Laboratory: Recent Labs  Lab 09/04/17 1904 09/05/17 0318 09/05/17 0808  WBC 5.2 5.3 4.8  HGB 6.1* 6.4* 7.7*  HCT 24.5* 24.6* 27.9*  PLT 240 190 197   Recent Labs  Lab 09/04/17 1904 09/05/17 0318  NA 138 137  K 3.9 3.7  CL 108 108  CO2 20* 21*  BUN 13 10  CREATININE 0.69 0.63  CALCIUM 8.9 8.7*  PROT 7.7  --   BILITOT 0.6  --   ALKPHOS 36*  --   ALT 21  --   AST 31  --   GLUCOSE 85 91    Imaging/Diagnostic Tests: none  Kathrene Alu, MD 09/06/2017, 6:23 AM PGY-1, [place] Intern pager: [phone], text pages welcome

## 2017-09-06 NOTE — Progress Notes (Signed)
FPTS Interim Progress Note  Discussed with on-call OBGYN transvaginal ultrasound findings c/w 9 x 8 x 9 cm mass in the posterior uterine fundus separate from the endometrium thought to represent a large sub endometrial fibroid.  They agree with the findings and recommend making sure she is transfused to replete losses and receive follow-up with OBGYN for further management.  They do not feel further imaging is warranted nor do they feel this mass is something other than a fibroid.  Discussed with attending who is in agreement.  Winn Bing, DO 09/06/2017, 2:52 PM PGY-2, [place] Medicine Service pager: [phone]

## 2017-09-06 NOTE — Plan of Care (Signed)
  Clinical Measurements: Ability to maintain clinical measurements within normal limits will improve 09/06/2017 0247 - Progressing by Marcos Eke, RN   Health Behavior/Discharge Planning: Ability to manage health-related needs will improve 09/06/2017 0247 - Progressing by Marcos Eke, RN   Clinical Measurements: Will remain free from infection 09/06/2017 0247 - Progressing by Marcos Eke, RN

## 2017-09-06 NOTE — Progress Notes (Signed)
FPTS Interim Progress Note  Discussed with patient transvaginal ultrasound findings concerning for sub-endothelial fibroid which is assumed to be the source of her symptomatic anemia.  Patient states she is not having active vaginal bleeding as of today.  She does have some superficial left-sided chest pain as of this afternoon after waking up worse with movement and deep breathing.  She is otherwise asymptomatic.  Given Tylenol.  Also discussed need for additional 1 unit of blood transfusion and will recheck CBC in the morning.  Plan to discharge on 1/27 assuming hemoglobin is stable with OBGYN follow-up within the next few weeks.  Patient without further questions or concerns.  She states she is pleased to know the source of her bleeding and feels much better today.  [place] Bing, DO 09/06/2017, 3:02 PM PGY-2, [place] [facility] pager: [phone]

## 2017-09-07 DIAGNOSIS — D259 Leiomyoma of uterus, unspecified: Principal | ICD-10-CM

## 2017-09-07 LAB — TYPE AND SCREEN
ABO/RH(D): A POS
Antibody Screen: NEGATIVE
Unit division: 0
Unit division: 0
Unit division: 0

## 2017-09-07 LAB — CBC
HCT: 30.1 % — ABNORMAL LOW (ref 36.0–46.0)
Hemoglobin: 8.4 g/dL — ABNORMAL LOW (ref 12.0–15.0)
MCH: 18.8 pg — ABNORMAL LOW (ref 26.0–34.0)
MCHC: 27.9 g/dL — ABNORMAL LOW (ref 30.0–36.0)
MCV: 67.5 fL — ABNORMAL LOW (ref 78.0–100.0)
Platelets: 201 10*3/uL (ref 150–400)
RBC: 4.46 MIL/uL (ref 3.87–5.11)
RDW: 23.8 % — ABNORMAL HIGH (ref 11.5–15.5)
WBC: 4.4 10*3/uL (ref 4.0–10.5)

## 2017-09-07 LAB — BPAM RBC
Blood Product Expiration Date: 201902122359
Blood Product Expiration Date: 201902122359
Blood Product Expiration Date: 201902132359
ISSUE DATE / TIME: 201901242203
ISSUE DATE / TIME: 201901250502
ISSUE DATE / TIME: 201901261541
Unit Type and Rh: 6200
Unit Type and Rh: 6200
Unit Type and Rh: 6200

## 2017-09-07 MED ORDER — POLYETHYLENE GLYCOL 3350 17 G PO PACK: 17.0000 g | PACK | Freq: Every day | ORAL | 0 refills | Status: DC

## 2017-09-07 MED ORDER — FERROUS SULFATE 325 (65 FE) MG PO TABS: 325.0000 mg | ORAL_TABLET | Freq: Every day | ORAL | 3 refills | Status: DC

## 2017-09-07 NOTE — Progress Notes (Signed)
Pt d/c home, no new concerns or complain, alert and oriented x 4. D/C instructions done with teach back, pt verbalize understanding. Pt is transported out of hospital by spouse.

## 2017-09-07 NOTE — Progress Notes (Signed)
Family Medicine Teaching Service Daily Progress Note Intern Pager: [phone]  Patient name: Amanda [name] Medical record number: [id number] Date of birth: January 27, 1981 Age: 37 y.o. Gender: female  Primary Care Provider: Derek Jack, MD (Inactive) Consultants: none Code Status: full  Pt Overview and Major Events to Date:  1/24- received two units pRBC 1/25- unremarkable pelvic exam with cervix visualized 1/26- received addition 1u PRBC  Assessment and Plan: Amanda [name] is a 37 y.o. female presenting with symptomatic anemia with a hemoglobin of 6.1. PMH is significant for multiple sclerosis, obesity.  Symptomatic microcytic anemia: secondary to fibroid seen on pelvic US. CBC this morning stable 8.4 -follow up w/ OB/gyn as outpatient for further management - start Fe supplementation with Vitamin C and bowel regimen (will add vitamin C at discharge)  Multiple Sclerosis: Patient was diagnosed in 2009 and sees Dr. Baird Kay, next appt in March. Last relapse was in 2016.  Patient currently experiences some dysarthria, joint pain, and tingling in her lower extremities.  Takes teriflunomide (Aubagio) 14 mg daily. - continue Aubagio   FEN/GI: regular diet PPx: none due to significant anemia and frequent ambulation  Disposition: home today  Subjective:  Doing well this morning. Does not have any more vaginal bleeding. She is eager to go home. No complaints.  Objective: Temp:  [98 F (36.7 C)-99.3 F (37.4 C)] 98.2 F (36.8 C) (01/27 0537) Pulse Rate:  [88-99] 90 (01/27 0537) Resp:  [18-20] 20 (01/27 0537) BP: (117-143)/(68-83) 125/81 (01/27 0537) SpO2:  [91 %-100 %] 100 % (01/27 0537)   Gen: pleasant 37 year old female sitting up in bed in NAD Heart: RRR no MRG Lungs: CTA bilaterally, no increased work of breathing Abdomen: soft, non-tender, non-distended, +BS Extremities: no edema or cyanosis, +2 dorsalis pedis pulse bilaterally Neuro: no focal  deficits   Laboratory: Recent Labs  Lab 09/05/17 0808 09/06/17 0642 09/07/17 0348  WBC 4.8 4.0 4.4  HGB 7.7* 7.1* 8.4*  HCT 27.9* 26.5* 30.1*  PLT 197 187 201   Recent Labs  Lab 09/04/17 1904 09/05/17 0318  NA 138 137  K 3.9 3.7  CL 108 108  CO2 20* 21*  BUN 13 10  CREATININE 0.69 0.63  CALCIUM 8.9 8.7*  PROT 7.7  --   BILITOT 0.6  --   ALKPHOS 36*  --   ALT 21  --   AST 31  --   GLUCOSE 85 91    Imaging/Diagnostic Tests: US Pelvic Complete With Transvaginal  Result Date: 09/06/2017 CLINICAL DATA:  Heavy min cysts for 3 months. EXAM: TRANSABDOMINAL AND TRANSVAGINAL ULTRASOUND OF PELVIS TECHNIQUE: Both transabdominal and transvaginal ultrasound examinations of the pelvis were performed. Transabdominal technique was performed for global imaging of the pelvis including uterus, ovaries, adnexal regions, and pelvic cul-de-sac. It was necessary to proceed with endovaginal exam following the transabdominal exam to visualize the ovaries and endometrium. COMPARISON:  None FINDINGS: Uterus Measurements: Uterus measures 14 x 10 x 11 cm and is heterogeneous. There is a mass in the posterior fundus of the uterus, apparently separate from the endometrium, measuring 9 x 8 x 9 cm. No fibroids or other mass visualized. Endometrium Thickness: 16 mm.  No focal abnormality visualized. Right ovary Measurements: 3.9 x 3 x 3.9 cm.  Normal appearance/no adnexal mass. Left ovary Measurements: 4.1 x 3.1 x 2.7 cm. A 2.2 cm hypoechoic region in the left ovary is likely a hemorrhagic follicle. Other findings No abnormal free fluid. IMPRESSION: 1. There is a 9 x 8  x 9 cm mass in the posterior uterine fundus. This appears to be separate from the endometrium and is thought to represent a large sub endometrial fibroid. Electronically Signed   By: Dorise Bullion III M.D   On: 09/06/2017 10:21     Steve Rattler, DO 09/07/2017, 8:11 AM PGY-2, [place] Intern pager: [phone], text  pages welcome

## 2017-09-07 NOTE — Discharge Instructions (Signed)
Uterine Fibroids Uterine fibroids are tissue masses (tumors) that can develop in the womb (uterus). They are also called leiomyomas. This type of tumor is not cancerous (benign) and does not spread to other parts of the body outside of the pelvic area, which is between the hip bones. Occasionally, fibroids may develop in the fallopian tubes, in the cervix, or on the support structures (ligaments) that surround the uterus. You can have one or many fibroids. Fibroids can vary in size, weight, and where they grow in the uterus. Some can become quite large. Most fibroids do not require medical treatment. What are the causes? A fibroid can develop when a single uterine cell keeps growing (replicating). Most cells in the human body have a control mechanism that keeps them from replicating without control. What are the signs or symptoms? Symptoms may include:  Heavy bleeding during your period.  Bleeding or spotting between periods.  Pelvic pain and pressure.  Bladder problems, such as needing to urinate more often (urinary frequency) or urgently.  Inability to reproduce offspring (infertility).  Miscarriages.  How is this diagnosed? Uterine fibroids are diagnosed through a physical exam. Your health care provider may feel the lumpy tumors during a pelvic exam. Ultrasonography and an MRI may be done to determine the size, location, and number of fibroids. How is this treated? Treatment may include:  Watchful waiting. This involves getting the fibroid checked by your health care provider to see if it grows or shrinks. Follow your health care provider's recommendations for how often to have this checked.  Hormone medicines. These can be taken by mouth or given through an intrauterine device (IUD).  Surgery. ? Removing the fibroids (myomectomy) or the uterus (hysterectomy). ? Removing blood supply to the fibroids (uterine artery embolization).  If fibroids interfere with your fertility and you  want to become pregnant, your health care provider may recommend having the fibroids removed. Follow these instructions at home:  Keep all follow-up visits as directed by your health care provider. This is important.  Take over-the-counter and prescription medicines only as told by your health care provider. ? If you were prescribed a hormone treatment, take the hormone medicines exactly as directed.  Ask your health care provider about taking iron pills and increasing the amount of dark green, leafy vegetables in your diet. These actions can help to boost your blood iron levels, which may be affected by heavy menstrual bleeding.  Pay close attention to your period and tell your health care provider about any changes, such as: ? Increased blood flow that requires you to use more pads or tampons than usual per month. ? A change in the number of days that your period lasts per month. ? A change in symptoms that are associated with your period, such as abdominal cramping or back pain. Contact a health care provider if:  You have pelvic pain, back pain, or abdominal cramps that cannot be controlled with medicines.  You have an increase in bleeding between and during periods.  You soak tampons or pads in a half hour or less.  You feel lightheaded, extra tired, or weak. Get help right away if:  You faint.  You have a sudden increase in pelvic pain. This information is not intended to replace advice given to you by your health care provider. Make sure you discuss any questions you have with your health care provider. Document Released: 07/26/2000 Document Revised: 03/28/2016 Document Reviewed: 01/25/2014 Elsevier Interactive Patient Education  2018 [employer]vier Inc.  

## 2017-09-08 ENCOUNTER — Other Ambulatory Visit: Payer: Self-pay

## 2017-09-08 ENCOUNTER — Encounter (HOSPITAL_COMMUNITY): Payer: Self-pay | Admitting: Emergency Medicine

## 2017-09-08 ENCOUNTER — Emergency Department (HOSPITAL_COMMUNITY)
Admission: EM | Admit: 2017-09-08 | Discharge: 2017-09-09 | Disposition: A | Discharge status: Home / Self Care | Payer: PRIVATE HEALTH INSURANCE | Attending: Emergency Medicine | Admitting: Emergency Medicine

## 2017-09-08 DIAGNOSIS — K920 Hematemesis: Secondary | ICD-10-CM

## 2017-09-08 DIAGNOSIS — Z79899 Other long term (current) drug therapy: Secondary | ICD-10-CM | POA: Insufficient documentation

## 2017-09-08 MED ORDER — SODIUM CHLORIDE 0.9 % IV BOLUS (SEPSIS)
1000.0000 mL | Freq: Once | INTRAVENOUS | Status: AC
  Administered 2017-09-09: 1000 mL via INTRAVENOUS

## 2017-09-08 MED ORDER — ONDANSETRON HCL 4 MG/2ML IJ SOLN
4.0000 mg | Freq: Once | INTRAMUSCULAR | Status: AC
  Administered 2017-09-09: 4 mg via INTRAVENOUS
  Filled 2017-09-08: qty 2

## 2017-09-08 NOTE — ED Provider Notes (Signed)
[facility] DEPT Provider Note   CSN: [id number] Arrival date & time: 09/08/17  2158     History   Chief Complaint Chief Complaint  Patient presents with  . Abdominal Pain  . GI Bleeding    HPI Amanda [name] is a 37 y.o. female.  HPI  This is a 37 year old female with a history of multiple sclerosis, anemia thought to be secondary to uterine fibroids with recent hospitalization requiring transfusion female who presents with reported hematemesis and hematochezia.  Patient reports that she was discharged from the hospital yesterday after receiving multiple blood transfusions for iron deficiency anemia secondary to heavy vaginal bleeding.  She denies any recurrent heavy vaginal bleeding.  Today after eating she noted a sharp pain in her lower abdomen.  She got up and vomited.  She reports gross blood in her vomit.  She subsequently had an episode of loose stool that was grossly bloody as well.  She denies eating anything that would have looked like blood.  Currently she denies any pain.  She denies any recent sick contacts or fevers.  She denies any anti-inflammatory use including NSAIDs.  Denies alcohol use.  Currently on iron supplementation.  Past Medical History:  Diagnosis Date  . BP (high blood pressure)   . Multiple sclerosis [facility])     Patient Active Problem List   Diagnosis Date Noted  . Uterine fibroid 09/07/2017  . Stroke ([place]) 09/06/2017  . Severe anemia   . Multiple sclerosis ([place])   . Morbid obesity ([place])   . Heavy menses     History reviewed. No pertinent surgical history.  OB History    No data available       Home Medications    Prior to Admission medications   Medication Sig Start Date End Date Taking? Authorizing Provider  ferrous sulfate 325 (65 FE) MG tablet Take 1 tablet (325 mg total) by mouth daily with breakfast. 09/07/17  Yes Riccio, Angela C, DO  Teriflunomide (AUBAGIO) 14 MG TABS Take 14 mg by mouth daily.   Yes  [provider]  omeprazole (PRILOSEC) 20 MG capsule Take 1 capsule (20 mg total) by mouth daily. 09/09/17   Cadance Raus, Barbette Hair, MD  ondansetron (ZOFRAN ODT) 4 MG disintegrating tablet Take 1 tablet (4 mg total) by mouth every 8 (eight) hours as needed for nausea or vomiting. 09/09/17   Mickala Laton, Barbette Hair, MD  polyethylene glycol (MIRALAX / GLYCOLAX) packet Take 17 g by mouth daily. 09/07/17   Steve Rattler, DO    Family History Family History  Problem Relation Age of Onset  . Diabetes Mother   . Hypertension Mother   . Fibroids Sister     Social History Social History   Tobacco Use  . Smoking status: Never Smoker  . Smokeless tobacco: Never Used  Substance Use Topics  . Alcohol use: No    Frequency: Never  . Drug use: No     Allergies   Patient has no known allergies.   Review of Systems Review of Systems  Constitutional: Negative for fever.  Respiratory: Negative for shortness of breath.   Cardiovascular: Negative for chest pain.  Gastrointestinal: Positive for abdominal pain, blood in stool, nausea and vomiting.  Genitourinary: Negative for dysuria and vaginal bleeding.  Neurological: Negative for dizziness and syncope.  All other systems reviewed and are negative.    Physical Exam Updated Vital Signs BP 132/84 (BP Location: Right Arm)   Pulse 82   Temp 98.1 F (  36.7 C) (Oral)   Resp 15   LMP 08/15/2017 (Within Days)   SpO2 100%   Physical Exam  Constitutional: She is oriented to person, place, and time. She appears well-developed and well-nourished. She does not appear ill.  Obese, no acute distress  HENT:  Head: Normocephalic and atraumatic.  Neck: Neck supple.  Cardiovascular: Normal rate, regular rhythm and normal heart sounds.  Pulmonary/Chest: Effort normal. No respiratory distress. She has no wheezes.  Abdominal: Soft. Bowel sounds are normal. There is no tenderness. There is no rebound and no guarding.  Neurological: She is alert and  oriented to person, place, and time.  Skin: Skin is warm and dry.  Psychiatric: She has a normal mood and affect.  Nursing note and vitals reviewed.    ED Treatments / Results  Labs (all labs ordered are listed, but only abnormal results are displayed) Labs Reviewed  CBC WITH DIFFERENTIAL/PLATELET - Abnormal; Notable for the following components:      Result Value   Hemoglobin 9.5 (*)    HCT 32.9 (*)    MCV 67.7 (*)    MCH 19.5 (*)    MCHC 28.9 (*)    RDW 25.4 (*)    All other components within normal limits  COMPREHENSIVE METABOLIC PANEL - Abnormal; Notable for the following components:   Glucose, Bld 116 (*)    Total Protein 8.2 (*)    Alkaline Phosphatase 37 (*)    All other components within normal limits  LIPASE, BLOOD  POC OCCULT BLOOD, ED  TYPE AND SCREEN  ABO/RH    EKG  EKG Interpretation None       Radiology No results found.  Procedures Procedures (including critical care time)  Medications Ordered in ED Medications  ondansetron (ZOFRAN) injection 4 mg (4 mg Intravenous Given 09/09/17 0033)  sodium chloride 0.9 % bolus 1,000 mL (1,000 mLs Intravenous New Bag/Given 09/09/17 0033)     Initial Impression / Assessment and Plan / ED Course  I have reviewed the triage vital signs and the nursing notes.  Pertinent labs & imaging results that were available during my care of the patient were reviewed by me and considered in my medical decision making (see chart for details).     Patient presents with concerns for recurrent hematemesis and hematochezia.  Vital signs are reassuring.  Patient is nontoxic appearing.  She has had no active vomiting while in the emergency room.  Hemoglobin is improved from recent hospitalization from 8.7 and 9.5.  Hemoccult is negative.  There is no signs or symptoms of active bleeding.  On recheck, patient states she feels improved.  Will discharge home with Zofran and omeprazole.  GI follow-up provided.  It is unclear whether she  truly had hematemesis versus something that looked like blood.  She was given strict return precautions.  After history, exam, and medical workup I feel the patient has been appropriately medically screened and is safe for discharge home. Pertinent diagnoses were discussed with the patient. Patient was given return precautions.  Final Clinical Impressions(s) / ED Diagnoses   Final diagnoses:  Hematemesis with nausea    ED Discharge Orders        Ordered    ondansetron (ZOFRAN ODT) 4 MG disintegrating tablet  Every 8 hours PRN     09/09/17 0138    omeprazole (PRILOSEC) 20 MG capsule  Daily     09/09/17 0138       Merryl Hacker, MD 09/09/17 0140

## 2017-09-08 NOTE — ED Triage Notes (Signed)
Pt from home with c/o nausea, hematemesis, and blood in stool. Pt had a blood transfusion thurs, fri, and sat for low hemoglobin. Pt's emesis and pain began today around 1100. Pt was given 4mg  zofran by EMS   20 G rt hand

## 2017-09-09 LAB — COMPREHENSIVE METABOLIC PANEL
ALT: 19 U/L (ref 14–54)
AST: 28 U/L (ref 15–41)
Albumin: 3.9 g/dL (ref 3.5–5.0)
Alkaline Phosphatase: 37 U/L — ABNORMAL LOW (ref 38–126)
Anion gap: 6 (ref 5–15)
BUN: 13 mg/dL (ref 6–20)
CO2: 23 mmol/L (ref 22–32)
Calcium: 9.1 mg/dL (ref 8.9–10.3)
Chloride: 110 mmol/L (ref 101–111)
Creatinine, Ser: 0.66 mg/dL (ref 0.44–1.00)
GFR calc Af Amer: >60 mL/min (ref >60)
GFR calc non Af Amer: >60 mL/min (ref >60)
Glucose, Bld: 116 mg/dL — ABNORMAL HIGH (ref 65–99)
Potassium: 4.1 mmol/L (ref 3.5–5.1)
Sodium: 139 mmol/L (ref 135–145)
Total Bilirubin: 0.4 mg/dL (ref 0.3–1.2)
Total Protein: 8.2 g/dL — ABNORMAL HIGH (ref 6.5–8.1)

## 2017-09-09 LAB — CBC WITH DIFFERENTIAL/PLATELET
Basophils Absolute: 0 10*3/uL (ref 0.0–0.1)
Basophils Relative: 0 %
Eosinophils Absolute: 0.3 10*3/uL (ref 0.0–0.7)
Eosinophils Relative: 4 %
HCT: 32.9 % — ABNORMAL LOW (ref 36.0–46.0)
Hemoglobin: 9.5 g/dL — ABNORMAL LOW (ref 12.0–15.0)
Lymphocytes Relative: 12 %
Lymphs Abs: 0.9 10*3/uL (ref 0.7–4.0)
MCH: 19.5 pg — ABNORMAL LOW (ref 26.0–34.0)
MCHC: 28.9 g/dL — ABNORMAL LOW (ref 30.0–36.0)
MCV: 67.7 fL — ABNORMAL LOW (ref 78.0–100.0)
Monocytes Absolute: 0.5 10*3/uL (ref 0.1–1.0)
Monocytes Relative: 6 %
Neutro Abs: 6.1 10*3/uL (ref 1.7–7.7)
Neutrophils Relative %: 78 %
Platelets: 231 10*3/uL (ref 150–400)
RBC: 4.86 MIL/uL (ref 3.87–5.11)
RDW: 25.4 % — ABNORMAL HIGH (ref 11.5–15.5)
WBC: 7.8 10*3/uL (ref 4.0–10.5)

## 2017-09-09 LAB — LIPASE, BLOOD: Lipase: 34 U/L (ref 11–51)

## 2017-09-09 LAB — TYPE AND SCREEN
ABO/RH(D): A POS
Antibody Screen: NEGATIVE

## 2017-09-09 LAB — ABO/RH: ABO/RH(D): A POS

## 2017-09-09 LAB — POC OCCULT BLOOD, ED: Fecal Occult Bld: NEGATIVE

## 2017-09-09 MED ORDER — ONDANSETRON 4 MG PO TBDP: 4.0000 mg | ORAL_TABLET | Freq: Three times a day (TID) | ORAL | 0 refills | Status: DC | PRN

## 2017-09-09 MED ORDER — OMEPRAZOLE 20 MG PO CPDR: 20.0000 mg | DELAYED_RELEASE_CAPSULE | Freq: Every day | ORAL | 0 refills | Status: DC

## 2017-09-09 NOTE — Discharge Summary (Signed)
[facility] Discharge Summary  Patient name: Amanda [name] Medical record number: [id number] Date of birth: 1980-08-16 Age: 37 y.o. Gender: female Date of Admission: 09/08/2017  Date of Discharge: 09/07/17 Admitting Physician: No admitting provider for patient encounter.  Primary Care Provider: [facility] Consultants: none  Indication for Hospitalization: symptomatic anemia  Discharge Diagnoses/Problem List:  Fibroids Anemia Multiple sclerosis Obesity  Disposition: home  Discharge Condition: stable, improved  Discharge Exam:  Gen: pleasant 37 year old female sitting up in bed in NAD Heart: RRR no MRG Lungs: CTA bilaterally, no increased work of breathing Abdomen: soft, non-tender, non-distended, +BS Extremities: no edema or cyanosis, +2 dorsalis pedis pulse bilaterally Neuro: no focal deficits  Brief Hospital Course:  Amanda [name] was admitted on 1/24 after being found to have a hemoglobin of 6.1 at her PCP's office.  Patient had been experiencing fatigue, weakness, and dizziness over the past couple months and had had one very heavy period in December.  FOBT was negative.  She received two blood transfusions with resulting increase in hemoglobin to 8.4.  Patient needed Valium in order to tolerate a speculum exam, but this exam was negative for cervical lesions, vaginal lacerations, or blood in the vaginal vault.  A pelvic and vaginal [place] was positive for a large, posterior subendometrial fibroid.  This was determined to be the cause of her symptomatic anemia.  She was given vitamin C with iron supplementation as well as daily Miralax since patient experienced constipation with iron supplementation in the past.  She was instructed to follow up with an outpatient OBGYN for further outpatient management.  Issues for Follow Up:  1. Patient will likely need OCPs, megase, myomectomy, or a combination of these to prevent further symptomatic  anemia. 2. It will be important to continue iron supplementation until a definitive solution is found so that her hemoglobin will remain at a safe level.  Patient has discontinued iron in the past due to its constipating effect, but she was encouraged to take Miralax and to adhere to daily iron supplementation if possible.  Significant Procedures: none  Significant Labs and Imaging:  Recent Labs  Lab 09/06/17 0642 09/07/17 0348 09/09/17 0032  WBC 4.0 4.4 7.8  HGB 7.1* 8.4* 9.5*  HCT 26.5* 30.1* 32.9*  PLT 187 201 231   Recent Labs  Lab 09/04/17 1904 09/05/17 0318 09/09/17 0032  NA 138 137 139  K 3.9 3.7 4.1  CL 108 108 110  CO2 20* 21* 23  GLUCOSE 85 91 116*  BUN 13 10 13   CREATININE 0.69 0.63 0.66  CALCIUM 8.9 8.7* 9.1  ALKPHOS 36*  --  37*  AST 31  --  28  ALT 21  --  19  ALBUMIN 3.7  --  3.9   US Pelvic Complete With Transvaginal  Result Date: 09/06/2017 CLINICAL DATA:  Heavy min cysts for 3 months. EXAM: TRANSABDOMINAL AND TRANSVAGINAL ULTRASOUND OF PELVIS TECHNIQUE: Both transabdominal and transvaginal ultrasound examinations of the pelvis were performed. Transabdominal technique was performed for global imaging of the pelvis including uterus, ovaries, adnexal regions, and pelvic cul-de-sac. It was necessary to proceed with endovaginal exam following the transabdominal exam to visualize the ovaries and endometrium. COMPARISON:  None FINDINGS: Uterus Measurements: Uterus measures 14 x 10 x 11 cm and is heterogeneous. There is a mass in the posterior fundus of the uterus, apparently separate from the endometrium, measuring 9 x 8 x 9 cm. No fibroids or other mass visualized.  Endometrium Thickness: 16 mm.  No focal abnormality visualized. Right ovary Measurements: 3.9 x 3 x 3.9 cm.  Normal appearance/no adnexal mass. Left ovary Measurements: 4.1 x 3.1 x 2.7 cm. A 2.2 cm hypoechoic region in the left ovary is likely a hemorrhagic follicle. Other findings No abnormal free fluid.  IMPRESSION: 1. There is a 9 x 8 x 9 cm mass in the posterior uterine fundus. This appears to be separate from the endometrium and is thought to represent a large sub endometrial fibroid. Electronically Signed   By: Dorise Bullion III M.D   On: 09/06/2017 10:21      Results/Tests Pending at Time of Discharge: none  Discharge Medications:  Allergies as of 09/09/2017   No Known Allergies     Medication List    TAKE these medications   omeprazole 20 MG capsule Commonly known as:  PRILOSEC Take 1 capsule (20 mg total) by mouth daily.   ondansetron 4 MG disintegrating tablet Commonly known as:  ZOFRAN ODT Take 1 tablet (4 mg total) by mouth every 8 (eight) hours as needed for nausea or vomiting.     ASK your doctor about these medications   AUBAGIO 14 MG Tabs Generic drug:  Teriflunomide Take 14 mg by mouth daily.   ferrous sulfate 325 (65 FE) MG tablet Take 1 tablet (325 mg total) by mouth daily with breakfast.   polyethylene glycol packet Commonly known as:  MIRALAX / GLYCOLAX Take 17 g by mouth daily.       Discharge Instructions: Please refer to Patient Instructions section of EMR for full details.  Patient was counseled important signs and symptoms that should prompt return to medical care, changes in medications, dietary instructions, activity restrictions, and follow up appointments.   Follow-Up Appointments: Outpatient OBGYN   Kathrene Alu, MD 09/09/2017, 7:03 AM PGY-1, [place]

## 2017-09-09 NOTE — Discharge Summary (Deleted)
  The note originally documented on this encounter has been moved the the encounter in which it belongs.  

## 2017-09-09 NOTE — Discharge Instructions (Signed)
You were seen today with concerns for vomiting blood.  You have had no evidence of bloody emesis while here in the emergency room and your blood tests are stable.  Start omeprazole and Zofran at home.  Follow-up with gastroenterology.  If you develop new or worsening symptoms, recurrent bloody emesis, bloody stools, dizziness or any new or worsening symptoms you should be reevaluated.

## 2017-12-10 DIAGNOSIS — J189 Pneumonia, unspecified organism: Secondary | ICD-10-CM

## 2017-12-10 HISTORY — DX: Pneumonia, unspecified organism: J18.9

## 2017-12-19 ENCOUNTER — Emergency Department (HOSPITAL_COMMUNITY): Payer: PRIVATE HEALTH INSURANCE

## 2017-12-19 ENCOUNTER — Emergency Department (HOSPITAL_COMMUNITY)
Admission: EM | Admit: 2017-12-19 | Discharge: 2017-12-19 | Disposition: A | Discharge status: Home / Self Care | Payer: PRIVATE HEALTH INSURANCE | Attending: Emergency Medicine | Admitting: Emergency Medicine

## 2017-12-19 ENCOUNTER — Encounter (HOSPITAL_COMMUNITY): Payer: Self-pay | Admitting: Emergency Medicine

## 2017-12-19 DIAGNOSIS — J181 Lobar pneumonia, unspecified organism: Secondary | ICD-10-CM | POA: Insufficient documentation

## 2017-12-19 DIAGNOSIS — Z79899 Other long term (current) drug therapy: Secondary | ICD-10-CM | POA: Diagnosis not present

## 2017-12-19 DIAGNOSIS — R0602 Shortness of breath: Secondary | ICD-10-CM | POA: Diagnosis present

## 2017-12-19 DIAGNOSIS — J189 Pneumonia, unspecified organism: Secondary | ICD-10-CM

## 2017-12-19 LAB — BASIC METABOLIC PANEL
Anion gap: 7 (ref 5–15)
BUN: 11 mg/dL (ref 6–20)
CO2: 24 mmol/L (ref 22–32)
Calcium: 8.9 mg/dL (ref 8.9–10.3)
Chloride: 111 mmol/L (ref 101–111)
Creatinine, Ser: 0.74 mg/dL (ref 0.44–1.00)
GFR calc Af Amer: >60 mL/min (ref >60)
GFR calc non Af Amer: >60 mL/min (ref >60)
Glucose, Bld: 82 mg/dL (ref 65–99)
Potassium: 3.9 mmol/L (ref 3.5–5.1)
Sodium: 142 mmol/L (ref 135–145)

## 2017-12-19 LAB — CBC
HCT: 36.4 % (ref 36.0–46.0)
Hemoglobin: 11.1 g/dL — ABNORMAL LOW (ref 12.0–15.0)
MCH: 25.6 pg — ABNORMAL LOW (ref 26.0–34.0)
MCHC: 30.5 g/dL (ref 30.0–36.0)
MCV: 84.1 fL (ref 78.0–100.0)
Platelets: 216 10*3/uL (ref 150–400)
RBC: 4.33 MIL/uL (ref 3.87–5.11)
RDW: 16.1 % — ABNORMAL HIGH (ref 11.5–15.5)
WBC: 4.1 10*3/uL (ref 4.0–10.5)

## 2017-12-19 LAB — I-STAT TROPONIN, ED
Troponin i, poc: 0.02 ng/mL (ref 0.00–0.08)
Troponin i, poc: 0.02 ng/mL (ref 0.00–0.08)

## 2017-12-19 LAB — I-STAT BETA HCG BLOOD, ED (MC, WL, AP ONLY): I-stat hCG, quantitative: <5 m[IU]/mL (ref <5)

## 2017-12-19 LAB — D-DIMER, QUANTITATIVE: D-Dimer, Quant: 0.42 ug/mL-FEU (ref 0.00–0.50)

## 2017-12-19 MED ORDER — GUAIFENESIN-CODEINE 100-10 MG/5ML PO SYRP: 5.0000 mL | ORAL_SOLUTION | Freq: Three times a day (TID) | ORAL | 0 refills | Status: DC | PRN

## 2017-12-19 MED ORDER — DOXYCYCLINE HYCLATE 100 MG PO CAPS: 100.0000 mg | ORAL_CAPSULE | Freq: Two times a day (BID) | ORAL | 0 refills | Status: AC

## 2017-12-19 NOTE — Discharge Instructions (Signed)
Thank you for allowing me to provide your care today in the emergency department.  Take 1 tablet of doxycycline twice daily for the next 5 days.  You can take 5 mL of cough syrup every 8 hours.  Please do not take this if you have to work or drive because it contains codeine, which is a narcotic.  You can take thousand milligrams of Tylenol once every 8 hours for pain or 800 mg of ibuprofen with food every 8 hours for pain.  You can also alternate between these 2 medications and take 1 dose of each every 4 hours.  Please call and schedule follow-up appointment with your primary care provider when they reopen in 3 to 4 days.  If you develop new or worsening symptoms including significantly worsening shortness of breath, dizziness, lightheadedness, numbness or tingling, or other new concerning symptoms, please return to the emergency department for reevaluation.

## 2017-12-19 NOTE — ED Triage Notes (Signed)
Patient sent by PCP for further evaluation of SOB x 2 weeks, accompanied by L sided CP/tightness. Patient reports pain is worse with lying down and deep inspiration. Patient denies long road/plane rides. Resp e/u at rest, but patient quick to become short of breath with slight movement or ambulation. Skin warm/dry.

## 2017-12-19 NOTE — ED Notes (Signed)
Pt ambulated without difficulty, saturations maintained 100% on Room Air.

## 2017-12-19 NOTE — ED Provider Notes (Signed)
[facility] EMERGENCY DEPARTMENT Provider Note   CSN: [id number] Arrival date & time: 12/19/17  1011     History   Chief Complaint Chief Complaint  Patient presents with  . Shortness of Breath    HPI Amanda [name] is a 37 y.o. female with a history of multiple sclerosis, morbid obesity, and uterine fibroids who presents to the emergency department from her PCPs office with a chief complaint of dyspnea.  The patient endorses intermittent dyspnea and left-sided chest pain that began 2 weeks ago.  She states the pain is 10/10 and is present over the left chest and radiates under the left breast and back of the chest.  No radiation to the back.  Pain is worse when she lays flat and with deep breaths, Pain improved to 7-8/10 when sitting upright and leaning forward.  Symptoms have been intermittent, but have become constant over the last 3 days.  She denies fever, chills, I am a back pain, nausea, vomiting, diarrhea, abdominal pain, rash, or URI symptoms.  She was seen this morning by her PCP but the patient was found to be wheezing.  She received multiple albuterol nebulizer treatments and was advised to come to the ED for further evaluation.  She denies a history of asthma or COPD.  She is a non-smoker.  No recent immobilization or long travel.  She does not take oral contraceptives.  No history of sudden cardiac death, PE , or DVT.  She reports that she required a blood transfusion back in January due to anemia secondary to her uterine fibroids.  She denies recent fatigue.  The history is provided by the patient. No language interpreter was used.  Shortness of Breath  Associated symptoms include cough, wheezing and chest pain. Pertinent negatives include no headaches, no neck pain, no abdominal pain and no rash.    Past Medical History:  Diagnosis Date  . BP (high blood pressure)   . Multiple sclerosis [facility])     Patient Active Problem List   Diagnosis Date Noted  .  Uterine fibroid 09/07/2017  . Stroke ([place]) 09/06/2017  . Severe anemia   . Multiple sclerosis ([place])   . Morbid obesity ([place])   . Heavy menses     History reviewed. No pertinent surgical history.   OB History   None      Home Medications    Prior to Admission medications   Medication Sig Start Date End Date Taking? Authorizing Provider  ferrous sulfate 325 (65 FE) MG tablet Take 1 tablet (325 mg total) by mouth daily with breakfast. 09/07/17  Yes Riccio, Angela C, DO  SPRINTEC 28 0.25-35 MG-MCG tablet Take 1 tablet by mouth daily. 12/12/17  Yes [provider]  Teriflunomide (AUBAGIO) 14 MG TABS Take 14 mg by mouth daily.   Yes [provider]  doxycycline (VIBRAMYCIN) 100 MG capsule Take 1 capsule (100 mg total) by mouth 2 (two) times daily for 5 days. 12/19/17 12/24/17  Gianni Mihalik A, PA-C  guaiFENesin-codeine (ROBITUSSIN AC) 100-10 MG/5ML syrup Take 5 mLs by mouth 3 (three) times daily as needed for cough. 12/19/17   Rydell Wiegel A, PA-C    Family History Family History  Problem Relation Age of Onset  . Diabetes Mother   . Hypertension Mother   . Fibroids Sister     Social History Social History   Tobacco Use  . Smoking status: Never Smoker  . Smokeless tobacco: Never Used  Substance Use Topics  . Alcohol  use: No    Frequency: Never  . Drug use: No     Allergies   Patient has no known allergies.   Review of Systems Review of Systems  Constitutional: Negative for activity change and chills.  Respiratory: Positive for cough, shortness of breath and wheezing.   Cardiovascular: Positive for chest pain.  Gastrointestinal: Negative for abdominal pain, diarrhea and nausea.  Genitourinary: Negative for dysuria.  Musculoskeletal: Negative for back pain, myalgias, neck pain and neck stiffness.  Skin: Negative for rash.  Allergic/Immunologic: Negative for immunocompromised state.  Neurological: Negative for dizziness, syncope, weakness and  headaches.  Psychiatric/Behavioral: Negative for confusion.   Physical Exam Updated Vital Signs BP (!) 138/102   Pulse 98   Temp 98.5 F (36.9 C) (Oral)   Resp (!) 21   Ht 5\' 6"  (1.676 m)   Wt (!) 138.3 kg (305 lb)   LMP 12/14/2017 (Exact Date)   SpO2 99%   BMI 49.23 kg/m   Physical Exam  Constitutional: No distress.  Obese  HENT:  Head: Normocephalic.  Right Ear: Hearing, tympanic membrane and ear canal normal.  Left Ear: Hearing, tympanic membrane and ear canal normal.  Nose: Mucosal edema and rhinorrhea present. Right sinus exhibits no maxillary sinus tenderness and no frontal sinus tenderness. Left sinus exhibits no maxillary sinus tenderness and no frontal sinus tenderness.  Mouth/Throat: Uvula is midline, oropharynx is clear and moist and mucous membranes are normal. No oral lesions.  Eyes: Conjunctivae are normal.  Neck: Neck supple.  Cardiovascular: Normal rate, regular rhythm, normal heart sounds and intact distal pulses. Exam reveals no gallop and no friction rub.  No murmur heard. Pulmonary/Chest: Effort normal and breath sounds normal. No stridor. No respiratory distress. She has no wheezes. She has no rhonchi. She has no rales. She exhibits no tenderness.  Lungs are clear to auscultation bilaterally.  No wheezing. Exam limited by body habitus.  Abdominal: Soft. She exhibits no distension.  Neurological: She is alert.  Skin: Skin is warm. No rash noted.  Psychiatric: Her behavior is normal.  Nursing note and vitals reviewed.  ED Treatments / Results  Labs (all labs ordered are listed, but only abnormal results are displayed) Labs Reviewed  CBC - Abnormal; Notable for the following components:      Result Value   Hemoglobin 11.1 (*)    MCH 25.6 (*)    RDW 16.1 (*)    All other components within normal limits  BASIC METABOLIC PANEL  D-DIMER, QUANTITATIVE (NOT AT [facility])  I-STAT TROPONIN, ED  I-STAT BETA HCG BLOOD, ED ([facility], [facility], [facility] ONLY)  I-STAT TROPONIN, ED      EKG None  Radiology Dg Chest 2 View  Result Date: 12/19/2017 CLINICAL DATA:  Chest and left arm pain. EXAM: CHEST - 2 VIEW COMPARISON:  None. FINDINGS: Patchy airspace opacity noted in the left lower lobe. Lung volumes are low bilaterally. Insert upper no The visualized bony structures of the thorax are intact. IMPRESSION: Patchy airspace disease at the left base suggests pneumonia. Electronically Signed   By: Misty Stanley M.D.   On: 12/19/2017 11:25    Procedures Procedures (including critical care time)  Medications Ordered in ED Medications - No data to display   Initial Impression / Assessment and Plan / ED Course  I have reviewed the triage vital signs and the nursing notes.  Pertinent labs & imaging results that were available during my care of the patient were reviewed by me and considered in my medical  decision making (see chart for details).     37 year old with a history of multiple sclerosis, morbid obesity, and uterine fibroids who presents to the emergency department with a chief complaint of dyspnea, chest pain, and nonproductive cough.  Labs are notable for hemoglobin of 11.1, improved from previous.  Delta troponin is negative.  D-dimer is negative.  Chest x-ray concerning for pneumonia in the left lung base.  Patient was ambulated through the department on pulse ox and maintained an SaO2 in the mid to upper 90s, but became mildly tachycardic in the 110s.  Doubt CAD, PE, myocarditis, pericarditis, or cardiac tamponade.  Will discharge the patient home with doxycycline and a cough suppressant.  Recommended reevaluation by her PCP in 3 to 4 days when they reopen.  Strict return precautions given.  The patient is hemodynamically stable and in no acute distress.  We will discharge the patient to home at this time.  Final Clinical Impressions(s) / ED Diagnoses   Final diagnoses:  Community acquired pneumonia of left lower lobe of lung [facility])    ED Discharge Orders         Ordered    doxycycline (VIBRAMYCIN) 100 MG capsule  2 times daily     12/19/17 2034    guaiFENesin-codeine (ROBITUSSIN AC) 100-10 MG/5ML syrup  3 times daily PRN     12/19/17 2034       Joline Maxcy A, PA-C 12/19/17 2218    Margette Fast, MD 12/19/17 [phone]

## 2018-01-06 ENCOUNTER — Other Ambulatory Visit: Payer: Self-pay | Admitting: Family Medicine

## 2018-01-06 ENCOUNTER — Ambulatory Visit
Admission: RE | Admit: 2018-01-06 | Discharge: 2018-01-06 | Disposition: A | Discharge status: Home / Self Care | Payer: No Typology Code available for payment source | Source: Ambulatory Visit | Attending: Family Medicine | Admitting: Family Medicine

## 2018-01-06 DIAGNOSIS — R0602 Shortness of breath: Secondary | ICD-10-CM

## 2018-01-09 ENCOUNTER — Encounter (HOSPITAL_COMMUNITY): Payer: Self-pay | Admitting: Emergency Medicine

## 2018-01-09 ENCOUNTER — Emergency Department (HOSPITAL_COMMUNITY): Payer: PRIVATE HEALTH INSURANCE

## 2018-01-09 ENCOUNTER — Other Ambulatory Visit: Payer: Self-pay | Admitting: Cardiology

## 2018-01-09 ENCOUNTER — Inpatient Hospital Stay: Admission: AD | Admit: 2018-01-09 | Payer: Self-pay | Source: Ambulatory Visit | Admitting: Cardiology

## 2018-01-09 ENCOUNTER — Inpatient Hospital Stay (HOSPITAL_COMMUNITY)
Admission: EM | Admit: 2018-01-09 | Discharge: 2018-01-13 | DRG: 287 | Disposition: A | Discharge status: Home / Self Care | Payer: PRIVATE HEALTH INSURANCE | Attending: Cardiology | Admitting: Cardiology

## 2018-01-09 ENCOUNTER — Other Ambulatory Visit: Payer: Self-pay

## 2018-01-09 DIAGNOSIS — I509 Heart failure, unspecified: Secondary | ICD-10-CM

## 2018-01-09 DIAGNOSIS — Z8249 Family history of ischemic heart disease and other diseases of the circulatory system: Secondary | ICD-10-CM

## 2018-01-09 DIAGNOSIS — Z6841 Body Mass Index (BMI) 40.0 and over, adult: Secondary | ICD-10-CM | POA: Diagnosis not present

## 2018-01-09 DIAGNOSIS — Z79899 Other long term (current) drug therapy: Secondary | ICD-10-CM | POA: Diagnosis not present

## 2018-01-09 DIAGNOSIS — I428 Other cardiomyopathies: Secondary | ICD-10-CM | POA: Diagnosis present

## 2018-01-09 DIAGNOSIS — I5021 Acute systolic (congestive) heart failure: Principal | ICD-10-CM | POA: Diagnosis present

## 2018-01-09 DIAGNOSIS — I429 Cardiomyopathy, unspecified: Secondary | ICD-10-CM

## 2018-01-09 DIAGNOSIS — G35 Multiple sclerosis: Secondary | ICD-10-CM | POA: Diagnosis present

## 2018-01-09 DIAGNOSIS — R0602 Shortness of breath: Secondary | ICD-10-CM | POA: Diagnosis present

## 2018-01-09 HISTORY — DX: Anemia, unspecified: D64.9

## 2018-01-09 HISTORY — DX: Personal history of other medical treatment: Z92.89

## 2018-01-09 HISTORY — DX: Pneumonia, unspecified organism: J18.9

## 2018-01-09 LAB — CBC
HCT: 39.3 % (ref 36.0–46.0)
Hemoglobin: 12 g/dL (ref 12.0–15.0)
MCH: 25.2 pg — ABNORMAL LOW (ref 26.0–34.0)
MCHC: 30.5 g/dL (ref 30.0–36.0)
MCV: 82.6 fL (ref 78.0–100.0)
Platelets: 270 10*3/uL (ref 150–400)
RBC: 4.76 MIL/uL (ref 3.87–5.11)
RDW: 15 % (ref 11.5–15.5)
WBC: 4.4 10*3/uL (ref 4.0–10.5)

## 2018-01-09 LAB — BASIC METABOLIC PANEL
Anion gap: 8 (ref 5–15)
BUN: 12 mg/dL (ref 6–20)
CO2: 26 mmol/L (ref 22–32)
Calcium: 8.9 mg/dL (ref 8.9–10.3)
Chloride: 109 mmol/L (ref 101–111)
Creatinine, Ser: 0.88 mg/dL (ref 0.44–1.00)
GFR calc Af Amer: >60 mL/min (ref >60)
GFR calc non Af Amer: >60 mL/min (ref >60)
Glucose, Bld: 106 mg/dL — ABNORMAL HIGH (ref 65–99)
Potassium: 3.8 mmol/L (ref 3.5–5.1)
Sodium: 143 mmol/L (ref 135–145)

## 2018-01-09 LAB — BRAIN NATRIURETIC PEPTIDE: B Natriuretic Peptide: 1069.8 pg/mL — ABNORMAL HIGH (ref 0.0–100.0)

## 2018-01-09 LAB — I-STAT TROPONIN, ED: Troponin i, poc: 0.02 ng/mL (ref 0.00–0.08)

## 2018-01-09 MED ORDER — SODIUM CHLORIDE 0.9% FLUSH: 3.0000 mL | INTRAVENOUS | Status: DC | PRN

## 2018-01-09 MED ORDER — SODIUM CHLORIDE 0.9 % IV SOLN: 250.0000 mL | INTRAVENOUS | Status: DC | PRN

## 2018-01-09 MED ORDER — SACUBITRIL-VALSARTAN 24-26 MG PO TABS
1.0000 | ORAL_TABLET | Freq: Two times a day (BID) | ORAL | Status: DC
  Administered 2018-01-09 – 2018-01-11 (×4): 1 via ORAL
  Filled 2018-01-09 (×4): qty 1

## 2018-01-09 MED ORDER — ACETAMINOPHEN 325 MG PO TABS: 650.0000 mg | ORAL_TABLET | ORAL | Status: DC | PRN

## 2018-01-09 MED ORDER — POTASSIUM CHLORIDE CRYS ER 20 MEQ PO TBCR
40.0000 meq | EXTENDED_RELEASE_TABLET | Freq: Once | ORAL | Status: AC
  Administered 2018-01-09: 40 meq via ORAL
  Filled 2018-01-09: qty 2

## 2018-01-09 MED ORDER — FUROSEMIDE 10 MG/ML IJ SOLN: 40.0000 mg | Freq: Three times a day (TID) | INTRAMUSCULAR | Status: DC

## 2018-01-09 MED ORDER — FUROSEMIDE 10 MG/ML IJ SOLN
40.0000 mg | Freq: Once | INTRAMUSCULAR | Status: AC
  Administered 2018-01-09: 40 mg via INTRAVENOUS
  Filled 2018-01-09: qty 4

## 2018-01-09 MED ORDER — SODIUM CHLORIDE 0.9% FLUSH
3.0000 mL | Freq: Two times a day (BID) | INTRAVENOUS | Status: DC
  Administered 2018-01-09 – 2018-01-13 (×6): 3 mL via INTRAVENOUS

## 2018-01-09 MED ORDER — FUROSEMIDE 10 MG/ML IJ SOLN
40.0000 mg | Freq: Two times a day (BID) | INTRAMUSCULAR | Status: DC
  Administered 2018-01-10 – 2018-01-11 (×4): 40 mg via INTRAVENOUS
  Filled 2018-01-09 (×4): qty 4

## 2018-01-09 MED ORDER — ONDANSETRON HCL 4 MG/2ML IJ SOLN: 4.0000 mg | Freq: Four times a day (QID) | INTRAMUSCULAR | Status: DC | PRN

## 2018-01-09 MED ORDER — ENOXAPARIN SODIUM 40 MG/0.4ML ~~LOC~~ SOLN
40.0000 mg | SUBCUTANEOUS | Status: DC
  Administered 2018-01-10 – 2018-01-11 (×2): 40 mg via SUBCUTANEOUS
  Filled 2018-01-09 (×2): qty 0.4

## 2018-01-09 NOTE — ED Triage Notes (Signed)
Patient sent by Dr Wynonia Lawman after an echocardiogram showed the patient had "a weak heart". Patient out of breath and struggling to speak in complete sentences after walking from lobby to triage room. Patient denies pain, is alert and oriented and in no apparent distress at this time.

## 2018-01-09 NOTE — ED Triage Notes (Signed)
Patient sent by Dr. Wynonia Lawman, who tried to have her directly admitted, however due to no beds available, sent through ED for admission.

## 2018-01-09 NOTE — Consult Note (Signed)
Advanced Heart Failure Team Consult Note   Primary Physician: [name], PA PCP-Cardiologist:  No primary care provider on file.  Reason for Consultation: New onset CHF  HPI:    Amanda [name] is seen today for evaluation of CHF at the request of Dr. Wynonia Lawman.   37 yo with no prior cardiac history was sent to the ER by Dr. Wynonia Lawman today due to volume overload and tachycardia.  She has a history of multiple sclerosis that is currently quiescent.  She is taking a medication called Aubagio.  Earlier this year, she had profound anemia due to bleeding from uterine fibroids.  This has resolved.   For about 3 weeks, she has noted dyspnea with mild exertion.  Initially, she was also having left lower chest pain.  She came to the ER and was thought to have L base PNA on CXR and was started on antibiotics.  Dyspnea did not improve though chest pain mostly resolved.  She continues to wheeze.  She is short of breath walking around the house.  She has orthopnea. No lightheadedness and BP is running on the high side.  She was referred to Dr. Wynonia Lawman and was noted to be volume overloaded on exam.  Echo showed EF 10-15% today.  Given sinus tachy and volume overload, she was admitted through the ER.  CXR showed mild CHF and labs were notable for elevated BNP.   Review of Systems: All systems reviewed and negative except as per HPI.   Home Medications Prior to Admission medications   Medication Sig Start Date End Date Taking? Authorizing Provider  ferrous sulfate 325 (65 FE) MG tablet Take 1 tablet (325 mg total) by mouth daily with breakfast. 09/07/17   Riccio, Gardiner Rhyme, DO  guaiFENesin-codeine (ROBITUSSIN AC) 100-10 MG/5ML syrup Take 5 mLs by mouth 3 (three) times daily as needed for cough. 12/19/17   McDonald, Mia A, PA-C  SPRINTEC 28 0.25-35 MG-MCG tablet Take 1 tablet by mouth daily. 12/12/17   [provider]  Teriflunomide (AUBAGIO) 14 MG TABS Take 14 mg by mouth daily.    [provider]    Past Medical History: 1. Cardiomyopathy: Uncertain etiology.  Echo (01/09/18) with EF 10-15%, moderate MR, moderate TR. No ETOH.  Never been pregnant.  Mother with CHF in 16s.   2. Anemia: Blood loss from uterine fibroids.  3. Multiple sclerosis: Currently quiescent.  Takes Aubagio.   Past Surgical History: History reviewed. No pertinent surgical history.  Family History: Family History  Problem Relation Age of Onset  . Diabetes Mother   . Hypertension Mother   . Fibroids Sister     Social History: Social History   Socioeconomic History  . Marital status: Married    Spouse name: Not on file  . Number of children: Not on file  . Years of education: Not on file  . Highest education level: Not on file  Occupational History  . Not on file  Social Needs  . Financial resource strain: Not on file  . Food insecurity:    Worry: Not on file    Inability: Not on file  . Transportation needs:    Medical: Not on file    Non-medical: Not on file  Tobacco Use  . Smoking status: Never Smoker  . Smokeless tobacco: Never Used  Substance and Sexual Activity  . Alcohol use: No    Frequency: Never  . Drug use: No  . Sexual activity: Not on file  Lifestyle  . Physical activity:    Days per week: Not on file    Minutes per session: Not on file  . Stress: Not on file  Relationships  . Social connections:    Talks on phone: Not on file    Gets together: Not on file    Attends religious service: Not on file    Active member of club or organization: Not on file    Attends meetings of clubs or organizations: Not on file    Relationship status: Not on file  Other Topics Concern  . Not on file  Social History Narrative  . Not on file    Allergies:  No Known Allergies  Objective:    Vital Signs:   Temp:  [97.5 F (36.4 C)] 97.5 F (36.4 C) (05/31 1305) Pulse Rate:  [102-119] 109 (05/31 1630) Resp:  [16-27] 23 (05/31 1630) BP: (135-147)/(100-108) 135/108  (05/31 1630) SpO2:  [97 %-100 %] 98 % (05/31 1630)    Weight change: There were no vitals filed for this visit.  Intake/Output:   Intake/Output Summary (Last 24 hours) at 01/09/2018 1737 Last data filed at 01/09/2018 1734 Gross per 24 hour  Intake -  Output 1000 ml  Net -1000 ml      Physical Exam    General:  Well appearing. No resp difficulty HEENT: normal Neck: supple. JVP 12-14 cm. Carotids 2+ bilat; no bruits. No lymphadenopathy or thyromegaly appreciated. Cor: PMI lateral. Mildly tachy with regular rate & rhythm. No murmur.  +S3.  Lungs: clear Abdomen: soft, nontender, nondistended. No hepatosplenomegaly. No bruits or masses. Good bowel sounds. Extremities: no cyanosis, clubbing, rash. 1+ edema 1/2 to knees bilaterally.  Neuro: alert & orientedx3, cranial nerves grossly intact. moves all 4 extremities w/o difficulty. Affect pleasant   Telemetry   ST in 110s (personally reviewed)  EKG    Sinus tachy rate 113, personally reviewed.   Labs   Basic Metabolic Panel: Recent Labs  Lab 01/09/18 1313  NA 143  K 3.8  CL 109  CO2 26  GLUCOSE 106*  BUN 12  CREATININE 0.88  CALCIUM 8.9    Liver Function Tests: No results for input(s): AST, ALT, ALKPHOS, BILITOT, PROT, ALBUMIN in the last 168 hours. No results for input(s): LIPASE, AMYLASE in the last 168 hours. No results for input(s): AMMONIA in the last 168 hours.  CBC: Recent Labs  Lab 01/09/18 1313  WBC 4.4  HGB 12.0  HCT 39.3  MCV 82.6  PLT 270    Cardiac Enzymes: No results for input(s): CKTOTAL, CKMB, CKMBINDEX, TROPONINI in the last 168 hours.  BNP: BNP (last 3 results) Recent Labs    01/09/18 1313  BNP 1,069.8*    ProBNP (last 3 results) No results for input(s): PROBNP in the last 8760 hours.   CBG: No results for input(s): GLUCAP in the last 168 hours.  Coagulation Studies: No results for input(s): LABPROT, INR in the last 72 hours.   Imaging   Dg Chest 2 View  Result  Date: 01/09/2018 CLINICAL DATA:  Chest pain and shortness of breath with cough, several months duration. EXAM: CHEST - 2 VIEW COMPARISON:  01/06/2018.  12/19/2017. FINDINGS: Cardiac silhouette is enlarged. Mediastinal shadows are otherwise normal. There is central bronchial thickening. There is a small amount of fluid in the fissures. No consolidation or collapse. No significant bone finding. IMPRESSION: Question mild CHF. Enlarged cardiac silhouette. This could be due to cardiomegaly and/or pericardial fluid. Small amount of fluid in  the fissures. Central bronchial thickening that could go along with bronchitis. Electronically Signed   By: Nelson Chimes M.D.   On: 01/09/2018 13:28      Medications:     Current Medications: . enoxaparin (LOVENOX) injection  40 mg Subcutaneous Q24H  . [START ON 01/10/2018] furosemide  40 mg Intravenous BID  . potassium chloride  40 mEq Oral Once  . sacubitril-valsartan  1 tablet Oral BID  . sodium chloride flush  3 mL Intravenous Q12H     Infusions: . sodium chloride        Assessment/Plan   1. Acute systolic CHF: She is volume overloaded on exam with sinus tachycardia and S3.  NYHA class III symptoms.  Mild pulmonary edema on CXR and elevated BNP.  Echo today with EF 10-15%, moderate MR.  Cause of CHF is uncertain.  Mother developed CHF in her 95s but no other family members have CHF that she knows of (has several healthy siblings).  She does not drink ETOH or use drugs.  No children, has not been pregnant so not peri-partum CMP.  It is possible that this is a viral cardiomyopathy.  We have not ruled out CAD.  - She has responded well to IV Lasix, will continue Lasix 40 mg IV bid for now.  - Add Entresto 24/26 bid, she appears to have BP room.  - Hold beta blocker with volume overload, may use ivabradine to slow HR a bit.  - She will need cardiac MRI.  Will also plan R/LHC when she is more diuresed.  2. History of uterine fibroids with bleeding/anemia:  This seems quiescent currently. Hgb 12 today.  3. Multiple sclerosis: Quiscent currently.  Aubagio, her MS medication, does not appear to have CHF as a side effect.  Only cardiac side effect appears to be HTN.   Discussed with Dr. Wynonia Lawman, CHF service will follow over weekend.   Length of Stay: 0  Loralie Champagne, MD  01/09/2018, 5:37 PM  Advanced Heart Failure Team Pager [phone] (M-F; 7a - 4p)  Please contact [facility] for night-coverage after hours (4p -7a ) and weekends on [email or web address]

## 2018-01-09 NOTE — ED Provider Notes (Signed)
[facility] EMERGENCY DEPARTMENT Provider Note   CSN: [id number] Arrival date & time: 01/09/18  1256     History   Chief Complaint Chief Complaint  Patient presents with  . Shortness of Breath    HPI Amanda [name] is a 37 y.o. female presenting for evaluation of shortness of breath.  Patient was seen by her cardiologist, Dr. Wynonia Lawman, this morning, and told to come to the ER for further evaluation and admission.  Patient states that the past several weeks, she has been having progressive shortness of breath.  She has significant dyspnea on exertion, unable to walk from the lobby to office room.  She reports orthopnea.  She denies fevers, chills, cough, chest pain, nausea, vomiting, abdominal pain.  She reports bilateral leg swelling, which is worsened over the past several weeks.  Echo was performed, which showed she had a "weak heart" and had fluid on her heart.  X-ray showed cardiomegaly.   HPI  Past Medical History:  Diagnosis Date  . BP (high blood pressure)   . Multiple sclerosis [facility])     Patient Active Problem List   Diagnosis Date Noted  . Acute systolic (congestive) heart failure ([place]) 01/09/2018  . Idiopathic cardiomyopathy ([place]) 01/09/2018  . Uterine fibroid 09/07/2017  . Multiple sclerosis ([place])   . Morbid obesity ([place])     History reviewed. No pertinent surgical history.   OB History   None      Home Medications    Prior to Admission medications   Medication Sig Start Date End Date Taking? Authorizing Provider  ferrous sulfate 325 (65 FE) MG tablet Take 1 tablet (325 mg total) by mouth daily with breakfast. 09/07/17   Riccio, Gardiner Rhyme, DO  guaiFENesin-codeine (ROBITUSSIN AC) 100-10 MG/5ML syrup Take 5 mLs by mouth 3 (three) times daily as needed for cough. 12/19/17   McDonald, Mia A, PA-C  SPRINTEC 28 0.25-35 MG-MCG tablet Take 1 tablet by mouth daily. 12/12/17   [provider]  Teriflunomide (AUBAGIO) 14 MG TABS Take 14 mg by  mouth daily.    [provider]    Family History Family History  Problem Relation Age of Onset  . Diabetes Mother   . Hypertension Mother   . Fibroids Sister     Social History Social History   Tobacco Use  . Smoking status: Never Smoker  . Smokeless tobacco: Never Used  Substance Use Topics  . Alcohol use: No    Frequency: Never  . Drug use: No     Allergies   Patient has no known allergies.   Review of Systems Review of Systems  Respiratory: Positive for shortness of breath.   Cardiovascular: Positive for leg swelling.  All other systems reviewed and are negative.    Physical Exam Updated Vital Signs BP (!) 147/104   Pulse (!) 104   Temp (!) 97.5 F (36.4 C) (Oral)   Resp 19   LMP 12/14/2017 (Exact Date)   SpO2 99%   Physical Exam  Constitutional: She is oriented to person, place, and time. She appears well-developed and well-nourished.  HENT:  Head: Normocephalic and atraumatic.  Eyes: Pupils are equal, round, and reactive to light. Conjunctivae and EOM are normal.  Neck: Normal range of motion. Neck supple.  Cardiovascular: Regular rhythm and intact distal pulses.  Tachycardic ~110  Pulmonary/Chest: Accessory muscle usage present. No respiratory distress. She has decreased breath sounds. She has wheezes.  Patient denies feeling short of breath at rest, although  taking a deep breath after short sentences.  Expiratory wheezes in the right lower field, decreased sounds in the left lower field. No signs of respiratory failure  Abdominal: Soft. She exhibits no distension and no mass. There is no tenderness. There is no guarding.  Musculoskeletal: Normal range of motion. She exhibits edema. She exhibits no tenderness.  Bilateral 1+ pitting edema.  Pedal pulses intact bilaterally.  No warmth or redness.  Neurological: She is alert and oriented to person, place, and time. No sensory deficit.  Skin: Skin is warm and dry.  Psychiatric: She has a normal  mood and affect.  Nursing note and vitals reviewed.    ED Treatments / Results  Labs (all labs ordered are listed, but only abnormal results are displayed) Labs Reviewed  BASIC METABOLIC PANEL - Abnormal; Notable for the following components:      Result Value   Glucose, Bld 106 (*)    All other components within normal limits  CBC - Abnormal; Notable for the following components:   MCH 25.2 (*)    All other components within normal limits  BRAIN NATRIURETIC PEPTIDE - Abnormal; Notable for the following components:   B Natriuretic Peptide 1,069.8 (*)    All other components within normal limits  BRAIN NATRIURETIC PEPTIDE  CBC WITH DIFFERENTIAL/PLATELET  COMPREHENSIVE METABOLIC PANEL  MAGNESIUM  TSH  CBC  I-STAT TROPONIN, ED    EKG EKG Interpretation  Date/Time:  Friday Jan 09 2018 13:11:53 EDT Ventricular Rate:  113 PR Interval:  166 QRS Duration: 74 QT Interval:  292 QTC Calculation: 400 R Axis:   67 Text Interpretation:  Sinus tachycardia Right atrial enlargement Non-specific ST-t changes Confirmed by Lajean Saver [phone]) on 01/09/2018 3:17:33 PM   Radiology Dg Chest 2 View  Result Date: 01/09/2018 CLINICAL DATA:  Chest pain and shortness of breath with cough, several months duration. EXAM: CHEST - 2 VIEW COMPARISON:  01/06/2018.  12/19/2017. FINDINGS: Cardiac silhouette is enlarged. Mediastinal shadows are otherwise normal. There is central bronchial thickening. There is a small amount of fluid in the fissures. No consolidation or collapse. No significant bone finding. IMPRESSION: Question mild CHF. Enlarged cardiac silhouette. This could be due to cardiomegaly and/or pericardial fluid. Small amount of fluid in the fissures. Central bronchial thickening that could go along with bronchitis. Electronically Signed   By: Nelson Chimes M.D.   On: 01/09/2018 13:28    Procedures Procedures (including critical care time)  Medications Ordered in ED Medications    acetaminophen (TYLENOL) tablet 650 mg (has no administration in time range)  ondansetron (ZOFRAN) injection 4 mg (has no administration in time range)  0.9 %  sodium chloride infusion (has no administration in time range)  sodium chloride flush (NS) 0.9 % injection 3 mL (has no administration in time range)  sodium chloride flush (NS) 0.9 % injection 3 mL (has no administration in time range)  furosemide (LASIX) injection 40 mg (has no administration in time range)  enoxaparin (LOVENOX) injection 40 mg (has no administration in time range)  furosemide (LASIX) injection 40 mg (40 mg Intravenous Given 01/09/18 1613)     Initial Impression / Assessment and Plan / ED Course  I have reviewed the triage vital signs and the nursing notes.  Pertinent labs & imaging results that were available during my care of the patient were reviewed by me and considered in my medical decision making (see chart for details).     Patient presenting for evaluation of acute heart failure.  Physical exam shows patient who is tachycardic with shortness of breath.  No respiratory failure.  Labs show elevated BNP, troponin negative.  No white count, and electrolites stable.  Discussed with Dr. Wynonia Lawman, who has admitted the patient and who is requesting Lasix.  Patient admitted to cardiology service.   Final Clinical Impressions(s) / ED Diagnoses   Final diagnoses:  Acute congestive heart failure, unspecified heart failure type [facility])    ED Discharge Orders    None       Franchot Heidelberg, PA-C 01/09/18 1627    Blanchie Dessert, MD 01/09/18 2110

## 2018-01-09 NOTE — H&P (Signed)
Amanda [name], Kathia    Date of visit:  01/08/2018 DOB:  1981/04/26    Age:  37 yrs. Medical record number:  [id number]     Account number:  [id number] Primary Care Provider: Marilynne Drivers ____________________________ CURRENT DIAGNOSES  1. Acute Systolic (congestive) Heart Failure  2. Dyspnea  3. Morbid (severe) obesity  4. Cardiomegaly  5. Tachycardia, unspecified ____________________________ ALLERGIES  No Known Drug Allergies ____________________________ MEDICATIONS  1. Aubagio 14 mg tablet, 1 p.o. daily  2. ferrous sulfate 325 mg (65 mg iron) tablet, BID  3. furosemide 20 mg tablet, BID  4. Sprintec (28) 0.25 mg-35 mcg tablet, 1 p.o. daily ____________________________ HISTORY OF PRESENT ILLNESS This very nice 37 year old black female is seen for evaluation of dyspnea and congestive heart failure. The patient previously has a history of multiple sclerosis and has been taken care of by a neurologist in [facility]. She previously was on injections and is currently on an oral medication Aubagio. The multiple sclerosis manifests itself by intermittent ataxia, speech difficulty and weakness. She is still followed by the neurologist there. She developed severe anemia and was admitted to the hospital earlier this year. 2 months ago she felt relatively well but was being considered for removal of a leiomyoma in her uterus due to severe uterine bleeding.  She was admitted earlier in the ear with severe anemia. She was in her usual state of health until about 4 weeks ago when she went to the emergency room and was treated for left lower lobe pneumonia. She has had significant dyspnea since then with worsening shortness of breath with activity but did have improvement and some left lower chest pain. She then developed PND orthopnea and has had difficulty sleeping. A chest x-ray done a couple of days ago showed cardiomegaly and some vascular congestion with improvement in the pneumonia. A BNP level was drawn  and was over 700 and I was asked to see her. She is quite dyspneic on walking into the room and was dyspneic during my examination and was also tachycardic. She has not had any anginal type chest pain. There is some family history of cardiac problems with congestive heart failure. She denies syncope. I saw her yesterday and asked her to begin furosemide but she did not get the prescription filled and returned today for an ECHO that showed severe global hypokinesis with an EF of 10-15%.  Moderate MR and TR. Unable to sleep flat and dyspneic with walking only 10 yards  ____________________________ PAST HISTORY  Past Medical Illnesses:  anemia-iron deficieny, obesity, multiple sclerosis, hypertension;  Cardiovascular Illnesses:  no previous history of cardiac disease;  Infectious Diseases:  no previous history of significant infectious diseases;  Surgical Procedures:  no previous surgical procedures;  Trauma History:  no previous history of significant trauma;  NYHA Classification:  I;  Cardiology Procedures-Invasive:  no previous interventional or invasive cardiology procedures;  Cardiology Procedures-Noninvasive:  ECHO 01/09/18   Peripheral Vascular Procedures:  no previous invasive peripheral vascular procedures.;  LVEF 10-15% ____________________________ CARDIO-PULMONARY TEST DATES EKG Date:  01/08/2018;   ____________________________ FAMILY HISTORY Father -- Father alive with problem, Diabetes mellitus Mother -- Mother alive with problem, Diabetes mellitus, Hypertension, Hypercholesterolemia, CVA Sister -- Sister alive with problem, Hypertension Sister -- Alive and well ____________________________ SOCIAL HISTORY Alcohol Use:  does not use alcohol;  Smoking:  never smoked;  Diet:  regular diet;  Lifestyle:  married and no children;  Exercise:  some exercise 2 days per week;  Occupation:  developmental specialist, [employer];  Residence:  lives with husband;    ____________________________ REVIEW OF SYSTEMS General:  Fatigue and weight gain recently  Integumentary: o rashes or new skin lesions. Eyes: denies diplopia, history of glaucoma or visual problems. Ears, Nose, Throat, Mouth:  denies any hearing loss, epistaxis, hoarseness or difficulty speaking. Respiratory: dyspnea with exertion Cardiovascular:  please review HPI Abdominal: denies dyspepsia, GI bleeding, constipation, or diarrheaGenitourinary-Female: no dysuria, urgency, frequency, UTIs, or stress incontinence Musculoskeletal:  denies arthritis, venous insufficiency, or muscle weakness Neurological:  occasional headaches, ataxia Psychiatric:  denies depression or anxiety Hematological/Immunologic:  denies any food allergies, bleeding disorders. ____________________________ PHYSICAL EXAMINATION VITAL SIGNS  Blood Pressure:  134/100   and 136/110   Pulse:  80/min. Weight:  319.00 lbs. Height:  67"BMI: 50  Constitutional:  pleasant African American female in no acute distress, severely obese, dyspneic at rest Skin:  warm and dry to touch, no apparent skin lesions, or masses noted. Head:  normocephalic, normal hair pattern, no masses or tenderness Eyes:  EOMS Intact, PERRLA, C and S clear, Funduscopic exam not done. ENT:  ears, nose and throat reveal no gross abnormalities.  Dentition good. Neck:  supple, without massess. No thyromegaly or carotid bruits. Carotid upstroke normal. JVD difficult to assess but appears up some Chest:  normal symmetry, mild crackles bases Cardiac:  rapid regular rhythm, normal S1 and S2, S4 noted, 1/6 murmur Abdomen:  abdomen soft,non-tender, no masses, no hepatospenomegaly, or aneurysm noted, severely obese Peripheral Pulses:  the femoral,dorsalis pedis, and posterior tibial pulses are full and equal bilaterally with no bruits auscultated. Extremities & Back:  1+ edema, no spinal abnormalities noted., normal muscle strength and tone. Neurological:  no gross motor or  sensory deficits noted, affect appropriate, oriented x3. ____________________________ MOST RECENT LIPID PANEL 09/04/17  CHOL TOTL 122 mg/dl, LDL 63 NM, HDL 49 mg/dl, TRIGLYCER 53 mg/dl and CHOL/HDL 2.5 (Calc) ____________________________ IMPRESSIONS/PLAN  1. Acute systolic congestive heart failure  2. Morbid obesity with recent weight gain possibly due to heart failure 3. Elevation of blood pressure without prior diagnosis of hypertension 4. History of multiple sclerosis 5. Recent pneumonia  Recommendations:  The patient is severely dyspneic today at rest and iss tachycardic. She has edema and has had a recent 13 pound weight gain. She will be admitted to the hospital for intravenous diuresis and initiation of therapy for CHF.  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  Due to tachycardia, age and low EF have asked consultation with the advanced heart failure team.                        ____________________________ Cardiology Physician:  Kerry Hough MD [facility]

## 2018-01-09 NOTE — ED Provider Notes (Signed)
Patient placed in Quick Look pathway, seen and evaluated   Chief Complaint: SOB, CP  HPI:   21 yof presents with progressively worsening SOB and CP for 3 weeks. Recently treated for pneumonia. Went to Dr. Wynonia Lawman with cardiology today who performed echo which showed "a weak heart with fluid around it" and recommended presentation to the ED. she endorses orthopnea, PND, and bilateral lower extremity edema.  She endorses intermittent chest pain which is exertional.  Denies fevers or chills, cough has improved.  She is a non-smoker.  ROS: Positive for chest pain, shortness of breath, leg swelling  Physical Exam:   Gen: No distress  Neuro: Awake and Alert  Skin: Warm    Focused Exam: Heart rate tachycardic.  No murmurs rubs or gallops noted.  Lungs clear to auscultation bilaterally.  1+ nonpitting edema of the bilateral lower extremities.  Homans sign absent bilaterally.   Initiation of care has begun. The patient has been counseled on the process, plan, and necessity for staying for the completion/evaluation, and the remainder of the medical screening examination    Debroah Baller 01/09/18 1328    Isla Pence, MD 01/09/18 1459

## 2018-01-10 LAB — BASIC METABOLIC PANEL
Anion gap: 5 (ref 5–15)
BUN: 9 mg/dL (ref 6–20)
CO2: 29 mmol/L (ref 22–32)
Calcium: 8.9 mg/dL (ref 8.9–10.3)
Chloride: 109 mmol/L (ref 101–111)
Creatinine, Ser: 0.88 mg/dL (ref 0.44–1.00)
GFR calc Af Amer: >60 mL/min (ref >60)
GFR calc non Af Amer: >60 mL/min (ref >60)
Glucose, Bld: 96 mg/dL (ref 65–99)
Potassium: 3.4 mmol/L — ABNORMAL LOW (ref 3.5–5.1)
Sodium: 143 mmol/L (ref 135–145)

## 2018-01-10 LAB — TSH: TSH: 1.006 u[IU]/mL (ref 0.350–4.500)

## 2018-01-10 MED ORDER — FERROUS SULFATE 325 (65 FE) MG PO TABS
325.0000 mg | ORAL_TABLET | Freq: Every day | ORAL | Status: DC
  Administered 2018-01-11 – 2018-01-13 (×4): 325 mg via ORAL
  Filled 2018-01-10 (×4): qty 1

## 2018-01-10 MED ORDER — TERIFLUNOMIDE 14 MG PO TABS
14.0000 mg | ORAL_TABLET | Freq: Every day | ORAL | Status: DC
  Administered 2018-01-11 – 2018-01-13 (×3): 14 mg via ORAL
  Filled 2018-01-10 (×4): qty 1

## 2018-01-10 MED ORDER — SPIRONOLACTONE 12.5 MG HALF TABLET
12.5000 mg | ORAL_TABLET | Freq: Every day | ORAL | Status: DC
  Administered 2018-01-10 – 2018-01-11 (×2): 12.5 mg via ORAL
  Filled 2018-01-10 (×2): qty 1

## 2018-01-10 MED ORDER — CARVEDILOL 3.125 MG PO TABS
3.1250 mg | ORAL_TABLET | Freq: Two times a day (BID) | ORAL | Status: DC
Start: 2018-01-10 — End: 2018-01-13
  Administered 2018-01-10 – 2018-01-13 (×6): 3.125 mg via ORAL
  Filled 2018-01-10 (×6): qty 1

## 2018-01-10 NOTE — Progress Notes (Signed)
Spoke with pharmacy ok to start home medications, no major contraindications. Will place order.

## 2018-01-10 NOTE — Progress Notes (Signed)
Patient ID: Amanda [name], female   DOB: [birth year], 37 y.o.   MRN: [id number]     Advanced Heart Failure Rounding Note  PCP-Cardiologist: No primary care provider on file.   Subjective:    Diuresed well and feeling better, still short of breath walking to bathroom, still with sinus tachy.   Objective:   Weight Range: (!) 307 lb 14.4 oz (139.7 kg) Body mass index is 49.7 kg/m.   Vital Signs:   Temp:  [97.5 F (36.4 C)-98.7 F (37.1 C)] 97.9 F (36.6 C) (06/01 0543) Pulse Rate:  [99-119] 110 (06/01 0543) Resp:  [16-27] 18 (06/01 0543) BP: (118-147)/(80-108) 128/96 (06/01 0543) SpO2:  [97 %-100 %] 97 % (06/01 0543) Weight:  [307 lb 14.4 oz (139.7 kg)-313 lb 9.6 oz (142.2 kg)] 307 lb 14.4 oz (139.7 kg) (06/01 0543) Last BM Date: 01/09/18  Weight change: Filed Weights   01/09/18 1741 01/10/18 0543  Weight: (!) 313 lb 9.6 oz (142.2 kg) (!) 307 lb 14.4 oz (139.7 kg)    Intake/Output:   Intake/Output Summary (Last 24 hours) at 01/10/2018 1148 Last data filed at 01/10/2018 0951 Gross per 24 hour  Intake 480 ml  Output 2950 ml  Net -2470 ml      Physical Exam    General:  Well appearing. No resp difficulty HEENT: Normal Neck: Supple. JVP 12-14 cm. Carotids 2+ bilat; no bruits. No lymphadenopathy or thyromegaly appreciated. Cor: PMI nondisplaced. Mildly tachy, regular rate & rhythm. +S3.  No murmur. Lungs: Clear Abdomen: Soft, nontender, nondistended. No hepatosplenomegaly. No bruits or masses. Good bowel sounds. Extremities: No cyanosis, clubbing, rash. Trace ankle edema.  Neuro: Alert & orientedx3, cranial nerves grossly intact. moves all 4 extremities w/o difficulty. Affect pleasant   Telemetry   ST in 100s, personally reviewed.   Labs    CBC Recent Labs    01/09/18 1313  WBC 4.4  HGB 12.0  HCT 39.3  MCV 82.6  PLT 378   Basic Metabolic Panel Recent Labs    01/09/18 1313  NA 143  K 3.8  CL 109  CO2 26  GLUCOSE 106*  BUN 12  CREATININE 0.88    CALCIUM 8.9   Liver Function Tests No results for input(s): AST, ALT, ALKPHOS, BILITOT, PROT, ALBUMIN in the last 72 hours. No results for input(s): LIPASE, AMYLASE in the last 72 hours. Cardiac Enzymes No results for input(s): CKTOTAL, CKMB, CKMBINDEX, TROPONINI in the last 72 hours.  BNP: BNP (last 3 results) Recent Labs    01/09/18 1313  BNP 1,069.8*    ProBNP (last 3 results) No results for input(s): PROBNP in the last 8760 hours.   D-Dimer No results for input(s): DDIMER in the last 72 hours. Hemoglobin A1C No results for input(s): HGBA1C in the last 72 hours. Fasting Lipid Panel No results for input(s): CHOL, HDL, LDLCALC, TRIG, CHOLHDL, LDLDIRECT in the last 72 hours. Thyroid Function Tests No results for input(s): TSH, T4TOTAL, T3FREE, THYROIDAB in the last 72 hours.  Invalid input(s): FREET3  Other results:   Imaging    Dg Chest 2 View  Result Date: 01/09/2018 CLINICAL DATA:  Chest pain and shortness of breath with cough, several months duration. EXAM: CHEST - 2 VIEW COMPARISON:  01/06/2018.  12/19/2017. FINDINGS: Cardiac silhouette is enlarged. Mediastinal shadows are otherwise normal. There is central bronchial thickening. There is a small amount of fluid in the fissures. No consolidation or collapse. No significant bone finding. IMPRESSION: Question mild CHF. Enlarged cardiac silhouette. This could  be due to cardiomegaly and/or pericardial fluid. Small amount of fluid in the fissures. Central bronchial thickening that could go along with bronchitis. Electronically Signed   By: Nelson Chimes M.D.   On: 01/09/2018 13:28      Medications:     Scheduled Medications: . carvedilol  3.125 mg Oral BID WC  . enoxaparin (LOVENOX) injection  40 mg Subcutaneous Q24H  . furosemide  40 mg Intravenous BID  . sacubitril-valsartan  1 tablet Oral BID  . sodium chloride flush  3 mL Intravenous Q12H  . spironolactone  12.5 mg Oral Daily     Infusions: . sodium  chloride       PRN Medications:  sodium chloride, acetaminophen, ondansetron (ZOFRAN) IV, sodium chloride flush  Assessment/Plan   1. Acute systolic CHF:  NYHA class III symptoms.  Mild pulmonary edema on CXR and elevated BNP.  Echo 5/31 with EF 10-15%, moderate MR.  Cause of CHF is uncertain.  Mother developed CHF in her 49s but no other family members have CHF that she knows of (has several healthy siblings).  She does not drink ETOH or use drugs.  No children, has not been pregnant so not peri-partum CMP.  It is possible that this is a viral cardiomyopathy.  We have not ruled out CAD. She is volume overloaded on exam still with sinus tachycardia and S3. She diuresed well with IV Lasix.  - Continue Lasix 40 mg IV bid. Need BMET today.  - Continue Entresto 24/26 bid.  - Add spironolactone 12.5 mg daily.  - Will add low dose Coreg today at 3.125 mg bid now that she has diuresed some.  Will likely not push Coreg further right away but consider ivabradine next.  - She will need cardiac MRI.  Will also plan [facility] when she is more diuresed => likely on Monday. We discussed risks/benefits of cath and she agrees.  - Need to check TSH.  2. History of uterine fibroids with bleeding/anemia: This seems quiescent currently.  3. Multiple sclerosis: Quiescent currently.  Aubagio, her MS medication, does not appear to have CHF as a side effect.  Only cardiac side effect appears to be HTN.    Length of Stay: 1  Loralie Champagne, MD  01/10/2018, 11:48 AM  Advanced Heart Failure Team Pager [phone] (M-F; 7a - 4p)  Please contact [facility] for night-coverage after hours (4p -7a ) and weekends on [email or web address]

## 2018-01-11 LAB — BASIC METABOLIC PANEL
Anion gap: 9 (ref 5–15)
BUN: 9 mg/dL (ref 6–20)
CO2: 27 mmol/L (ref 22–32)
Calcium: 8.9 mg/dL (ref 8.9–10.3)
Chloride: 105 mmol/L (ref 101–111)
Creatinine, Ser: 0.8 mg/dL (ref 0.44–1.00)
GFR calc Af Amer: >60 mL/min (ref >60)
GFR calc non Af Amer: >60 mL/min (ref >60)
Glucose, Bld: 101 mg/dL — ABNORMAL HIGH (ref 65–99)
Potassium: 3.6 mmol/L (ref 3.5–5.1)
Sodium: 141 mmol/L (ref 135–145)

## 2018-01-11 MED ORDER — SPIRONOLACTONE 12.5 MG HALF TABLET
12.5000 mg | ORAL_TABLET | Freq: Once | ORAL | Status: AC
  Administered 2018-01-11: 12.5 mg via ORAL
  Filled 2018-01-11: qty 1

## 2018-01-11 MED ORDER — SPIRONOLACTONE 25 MG PO TABS
25.0000 mg | ORAL_TABLET | Freq: Every day | ORAL | Status: DC
  Administered 2018-01-12 – 2018-01-13 (×2): 25 mg via ORAL
  Filled 2018-01-11 (×3): qty 1

## 2018-01-11 MED ORDER — POTASSIUM CHLORIDE CRYS ER 20 MEQ PO TBCR
40.0000 meq | EXTENDED_RELEASE_TABLET | Freq: Once | ORAL | Status: AC
  Administered 2018-01-11: 40 meq via ORAL
  Filled 2018-01-11: qty 2

## 2018-01-11 MED ORDER — SACUBITRIL-VALSARTAN 49-51 MG PO TABS
1.0000 | ORAL_TABLET | Freq: Two times a day (BID) | ORAL | Status: DC
  Administered 2018-01-11 – 2018-01-13 (×4): 1 via ORAL
  Filled 2018-01-11 (×4): qty 1

## 2018-01-11 NOTE — Progress Notes (Signed)
Patient ID: Amanda [name], female   DOB: 1981/02/08, 37 y.o.   MRN: [id number]     Advanced Heart Failure Rounding Note  PCP-Cardiologist: No primary care provider on file.   Subjective:    Diuresed well again, weight down 6 lbs.  Not short of breath walking in room now.     Objective:   Weight Range: (!) 301 lb 11.2 oz (136.9 kg) Body mass index is 48.7 kg/m.   Vital Signs:   Temp:  [97.7 F (36.5 C)-98.3 F (36.8 C)] 97.7 F (36.5 C) (06/02 0748) Pulse Rate:  [99-111] 99 (06/02 0748) Resp:  [20] 20 (06/02 0748) BP: (107-130)/(87-96) 117/87 (06/02 0748) SpO2:  [93 %-100 %] 96 % (06/02 0748) Weight:  [301 lb 11.2 oz (136.9 kg)] 301 lb 11.2 oz (136.9 kg) (06/02 0413) Last BM Date: 01/11/18  Weight change: Filed Weights   01/09/18 1741 01/10/18 0543 01/11/18 0413  Weight: (!) 313 lb 9.6 oz (142.2 kg) (!) 307 lb 14.4 oz (139.7 kg) (!) 301 lb 11.2 oz (136.9 kg)    Intake/Output:   Intake/Output Summary (Last 24 hours) at 01/11/2018 1131 Last data filed at 01/11/2018 1128 Gross per 24 hour  Intake 1320 ml  Output 950 ml  Net 370 ml      Physical Exam    General: NAD Neck: JVP 10 cm, no thyromegaly or thyroid nodule.  Lungs: Clear to auscultation bilaterally with normal respiratory effort. CV: Nondisplaced PMI.  Heart mildly tachy regular S1/S2, +S3, no murmur.  1+ ankle edema. Abdomen: Soft, nontender, no hepatosplenomegaly, no distention.  Skin: Intact without lesions or rashes.  Neurologic: Alert and oriented x 3.  Psych: Normal affect. Extremities: No clubbing or cyanosis.  HEENT: Normal.    Telemetry   NSR 90s-100s (personally reviewed)  Labs    CBC Recent Labs    01/09/18 1313  WBC 4.4  HGB 12.0  HCT 39.3  MCV 82.6  PLT 269   Basic Metabolic Panel Recent Labs    01/10/18 1151 01/11/18 0629  NA 143 141  K 3.4* 3.6  CL 109 105  CO2 29 27  GLUCOSE 96 101*  BUN 9 9  CREATININE 0.88 0.80  CALCIUM 8.9 8.9   Liver Function Tests No  results for input(s): AST, ALT, ALKPHOS, BILITOT, PROT, ALBUMIN in the last 72 hours. No results for input(s): LIPASE, AMYLASE in the last 72 hours. Cardiac Enzymes No results for input(s): CKTOTAL, CKMB, CKMBINDEX, TROPONINI in the last 72 hours.  BNP: BNP (last 3 results) Recent Labs    01/09/18 1313  BNP 1,069.8*    ProBNP (last 3 results) No results for input(s): PROBNP in the last 8760 hours.   D-Dimer No results for input(s): DDIMER in the last 72 hours. Hemoglobin A1C No results for input(s): HGBA1C in the last 72 hours. Fasting Lipid Panel No results for input(s): CHOL, HDL, LDLCALC, TRIG, CHOLHDL, LDLDIRECT in the last 72 hours. Thyroid Function Tests Recent Labs    01/10/18 1151  TSH 1.006    Other results:   Imaging    No results found.   Medications:     Scheduled Medications: . carvedilol  3.125 mg Oral BID WC  . enoxaparin (LOVENOX) injection  40 mg Subcutaneous Q24H  . ferrous sulfate  325 mg Oral Q breakfast  . furosemide  40 mg Intravenous BID  . potassium chloride  40 mEq Oral Once  . sacubitril-valsartan  1 tablet Oral BID  . sodium chloride flush  3  mL Intravenous Q12H  . spironolactone  12.5 mg Oral Once  . [START ON 01/12/2018] spironolactone  25 mg Oral Daily  . Teriflunomide  14 mg Oral Daily    Infusions: . sodium chloride      PRN Medications: sodium chloride, acetaminophen, ondansetron (ZOFRAN) IV, sodium chloride flush  Assessment/Plan   1. Acute systolic CHF:  NYHA class III symptoms.  Mild pulmonary edema on CXR and elevated BNP.  Echo 5/31 with EF 10-15%, moderate MR.  Cause of CHF is uncertain.  Mother developed CHF in her 32s but no other family members have CHF that she knows of (has several healthy siblings).  She does not drink ETOH or use drugs.  No children, has not been pregnant so not peri-partum CMP.  TSH normal.  It is possible that this is a viral cardiomyopathy.  We have not ruled out CAD. She is volume  overloaded on exam still with sinus tachycardia and S3, but volume status is improving. She diuresed well again with IV Lasix.  - Continue Lasix 40 mg IV bid today, reassess tomorrow.  Will give KCl 40.  - Increase Entresto to 49/51 bid.  - Increase spironolactone to 25 daily.   - Continue Coreg 3.125 mg bid.  Will either increase Coreg next or add ivabradine, will see what cardiac output is on RHC.  - She will need cardiac MRI.  Will also plan R/LHC on Monday. We discussed risks/benefits of cath and she agrees.  2. History of uterine fibroids with bleeding/anemia: This seems quiescent currently.  3. Multiple sclerosis: Quiescent currently.  Aubagio, her MS medication, does not appear to have CHF as a side effect. Only cardiac side effect appears to be HTN.   Length of Stay: 2  Loralie Champagne, MD  01/11/2018, 11:31 AM  Advanced Heart Failure Team Pager [phone] (M-F; 7a - 4p)  Please contact [facility] for night-coverage after hours (4p -7a ) and weekends on [email or web address]

## 2018-01-12 ENCOUNTER — Inpatient Hospital Stay (HOSPITAL_COMMUNITY): Payer: PRIVATE HEALTH INSURANCE

## 2018-01-12 ENCOUNTER — Encounter (HOSPITAL_COMMUNITY): Payer: Self-pay | Admitting: General Practice

## 2018-01-12 ENCOUNTER — Other Ambulatory Visit: Payer: Self-pay

## 2018-01-12 ENCOUNTER — Encounter (HOSPITAL_COMMUNITY)
Admission: EM | Disposition: A | Discharge status: Home / Self Care | Payer: Self-pay | Source: Home / Self Care | Attending: Cardiology

## 2018-01-12 DIAGNOSIS — I509 Heart failure, unspecified: Secondary | ICD-10-CM

## 2018-01-12 DIAGNOSIS — I429 Cardiomyopathy, unspecified: Secondary | ICD-10-CM

## 2018-01-12 HISTORY — PX: CARDIAC CATHETERIZATION: SHX172

## 2018-01-12 LAB — CBC WITH DIFFERENTIAL/PLATELET
Abs Immature Granulocytes: 0 10*3/uL (ref 0.0–0.1)
Basophils Absolute: 0.1 10*3/uL (ref 0.0–0.1)
Basophils Relative: 2 %
Eosinophils Absolute: 0.3 10*3/uL (ref 0.0–0.7)
Eosinophils Relative: 8 %
HCT: 40.6 % (ref 36.0–46.0)
Hemoglobin: 12.4 g/dL (ref 12.0–15.0)
Immature Granulocytes: 0 %
Lymphocytes Relative: 29 %
Lymphs Abs: 1.4 10*3/uL (ref 0.7–4.0)
MCH: 24.6 pg — ABNORMAL LOW (ref 26.0–34.0)
MCHC: 30.5 g/dL (ref 30.0–36.0)
MCV: 80.6 fL (ref 78.0–100.0)
Monocytes Absolute: 0.5 10*3/uL (ref 0.1–1.0)
Monocytes Relative: 12 %
Neutro Abs: 2.2 10*3/uL (ref 1.7–7.7)
Neutrophils Relative %: 49 %
Platelets: 260 10*3/uL (ref 150–400)
RBC: 5.04 MIL/uL (ref 3.87–5.11)
RDW: 14.6 % (ref 11.5–15.5)
WBC: 4.5 10*3/uL (ref 4.0–10.5)

## 2018-01-12 LAB — POCT I-STAT 3, VENOUS BLOOD GAS (G3P V)
Bicarbonate: 25.2 mmol/L (ref 20.0–28.0)
Bicarbonate: 25.4 mmol/L (ref 20.0–28.0)
O2 Saturation: 64 %
O2 Saturation: 67 %
TCO2: 26 mmol/L (ref 22–32)
TCO2: 27 mmol/L (ref 22–32)
pCO2, Ven: 42.2 mmHg — ABNORMAL LOW (ref 44.0–60.0)
pCO2, Ven: 42.3 mmHg — ABNORMAL LOW (ref 44.0–60.0)
pH, Ven: 7.383 (ref 7.250–7.430)
pH, Ven: 7.386 (ref 7.250–7.430)
pO2, Ven: 34 mmHg (ref 32.0–45.0)
pO2, Ven: 36 mmHg (ref 32.0–45.0)

## 2018-01-12 LAB — BASIC METABOLIC PANEL
Anion gap: 7 (ref 5–15)
BUN: 12 mg/dL (ref 6–20)
CO2: 24 mmol/L (ref 22–32)
Calcium: 8.6 mg/dL — ABNORMAL LOW (ref 8.9–10.3)
Chloride: 108 mmol/L (ref 101–111)
Creatinine, Ser: 0.77 mg/dL (ref 0.44–1.00)
GFR calc Af Amer: >60 mL/min (ref >60)
GFR calc non Af Amer: >60 mL/min (ref >60)
Glucose, Bld: 107 mg/dL — ABNORMAL HIGH (ref 65–99)
Potassium: 3.6 mmol/L (ref 3.5–5.1)
Sodium: 139 mmol/L (ref 135–145)

## 2018-01-12 SURGERY — RIGHT HEART CATH AND CORONARY ANGIOGRAPHY
Anesthesia: LOCAL

## 2018-01-12 MED ORDER — SODIUM CHLORIDE 0.9 % IV SOLN: 250.0000 mL | INTRAVENOUS | Status: DC | PRN

## 2018-01-12 MED ORDER — FUROSEMIDE 10 MG/ML IJ SOLN: 40.0000 mg | Freq: Two times a day (BID) | INTRAMUSCULAR | Status: DC

## 2018-01-12 MED ORDER — ASPIRIN 81 MG PO CHEW
81.0000 mg | CHEWABLE_TABLET | ORAL | Status: AC
  Administered 2018-01-12: 81 mg via ORAL
  Filled 2018-01-12: qty 1

## 2018-01-12 MED ORDER — HEPARIN SODIUM (PORCINE) 5000 UNIT/ML IJ SOLN
5000.0000 [IU] | Freq: Three times a day (TID) | INTRAMUSCULAR | Status: DC
  Administered 2018-01-12 – 2018-01-13 (×2): 5000 [IU] via SUBCUTANEOUS
  Filled 2018-01-12: qty 1

## 2018-01-12 MED ORDER — HEPARIN (PORCINE) IN NACL 1000-0.9 UT/500ML-% IV SOLN
INTRAVENOUS | Status: AC
  Filled 2018-01-12: qty 1000

## 2018-01-12 MED ORDER — IOPAMIDOL (ISOVUE-370) INJECTION 76%
INTRAVENOUS | Status: DC | PRN
  Administered 2018-01-12: 60 mL via INTRA_ARTERIAL

## 2018-01-12 MED ORDER — MIDAZOLAM HCL 2 MG/2ML IJ SOLN
INTRAMUSCULAR | Status: AC
  Filled 2018-01-12: qty 2

## 2018-01-12 MED ORDER — VERAPAMIL HCL 2.5 MG/ML IV SOLN
INTRAVENOUS | Status: DC | PRN
  Administered 2018-01-12: 10 mL via INTRA_ARTERIAL

## 2018-01-12 MED ORDER — SODIUM CHLORIDE 0.9% FLUSH: 3.0000 mL | INTRAVENOUS | Status: DC | PRN

## 2018-01-12 MED ORDER — FENTANYL CITRATE (PF) 100 MCG/2ML IJ SOLN
INTRAMUSCULAR | Status: DC | PRN
  Administered 2018-01-12 (×2): 25 ug via INTRAVENOUS

## 2018-01-12 MED ORDER — POTASSIUM CHLORIDE CRYS ER 20 MEQ PO TBCR
40.0000 meq | EXTENDED_RELEASE_TABLET | Freq: Once | ORAL | Status: AC
  Administered 2018-01-12: 40 meq via ORAL
  Filled 2018-01-12: qty 2

## 2018-01-12 MED ORDER — FENTANYL CITRATE (PF) 100 MCG/2ML IJ SOLN
INTRAMUSCULAR | Status: AC
  Filled 2018-01-12: qty 2

## 2018-01-12 MED ORDER — HEPARIN SODIUM (PORCINE) 1000 UNIT/ML IJ SOLN
INTRAMUSCULAR | Status: DC | PRN
  Administered 2018-01-12: 5000 [IU] via INTRAVENOUS

## 2018-01-12 MED ORDER — FUROSEMIDE 40 MG PO TABS
40.0000 mg | ORAL_TABLET | Freq: Every day | ORAL | Status: DC
  Administered 2018-01-13: 40 mg via ORAL
  Filled 2018-01-12 (×2): qty 1

## 2018-01-12 MED ORDER — HEPARIN (PORCINE) IN NACL 2-0.9 UNITS/ML
INTRAMUSCULAR | Status: AC | PRN
  Administered 2018-01-12 (×3): 500 mL via INTRA_ARTERIAL

## 2018-01-12 MED ORDER — GADOBENATE DIMEGLUMINE 529 MG/ML IV SOLN
38.0000 mL | Freq: Once | INTRAVENOUS | Status: AC | PRN
  Administered 2018-01-12: 38 mL via INTRAVENOUS

## 2018-01-12 MED ORDER — SODIUM CHLORIDE 0.9% FLUSH: 3.0000 mL | Freq: Two times a day (BID) | INTRAVENOUS | Status: DC

## 2018-01-12 MED ORDER — SODIUM CHLORIDE 0.9 % IV SOLN
INTRAVENOUS | Status: DC
  Administered 2018-01-12: 08:00:00 via INTRAVENOUS

## 2018-01-12 MED ORDER — HEPARIN SODIUM (PORCINE) 1000 UNIT/ML IJ SOLN
INTRAMUSCULAR | Status: AC
  Filled 2018-01-12: qty 1

## 2018-01-12 MED ORDER — SODIUM CHLORIDE 0.9% FLUSH
3.0000 mL | Freq: Two times a day (BID) | INTRAVENOUS | Status: DC
  Administered 2018-01-12 – 2018-01-13 (×3): 3 mL via INTRAVENOUS

## 2018-01-12 MED ORDER — ACETAMINOPHEN 325 MG PO TABS: 650.0000 mg | ORAL_TABLET | ORAL | Status: DC | PRN

## 2018-01-12 MED ORDER — ASPIRIN 81 MG PO CHEW: 81.0000 mg | CHEWABLE_TABLET | ORAL | Status: DC

## 2018-01-12 MED ORDER — LIDOCAINE HCL (PF) 1 % IJ SOLN
INTRAMUSCULAR | Status: DC | PRN
  Administered 2018-01-12 (×2): 2 mL via INTRADERMAL

## 2018-01-12 MED ORDER — SODIUM CHLORIDE 0.9 % IV SOLN: INTRAVENOUS | Status: DC

## 2018-01-12 MED ORDER — VERAPAMIL HCL 2.5 MG/ML IV SOLN
INTRAVENOUS | Status: AC
  Filled 2018-01-12: qty 2

## 2018-01-12 MED ORDER — ONDANSETRON HCL 4 MG/2ML IJ SOLN
4.0000 mg | Freq: Four times a day (QID) | INTRAMUSCULAR | Status: DC | PRN
Start: 2018-01-12 — End: 2018-01-13

## 2018-01-12 MED ORDER — LIDOCAINE HCL (PF) 1 % IJ SOLN
INTRAMUSCULAR | Status: AC
  Filled 2018-01-12: qty 30

## 2018-01-12 MED ORDER — MIDAZOLAM HCL 2 MG/2ML IJ SOLN
INTRAMUSCULAR | Status: DC | PRN
  Administered 2018-01-12 (×2): 1 mg via INTRAVENOUS

## 2018-01-12 SURGICAL SUPPLY — 16 items
CATH BALLN WEDGE 5F 110CM (CATHETERS) ×2 IMPLANT
CATH IMPULSE 5F ANG/FL3.5 (CATHETERS) ×2 IMPLANT
CATH LAUNCHER 5F JL3 (CATHETERS) ×1 IMPLANT
CATHETER LAUNCHER 5F JL3 (CATHETERS) ×2
DEVICE RAD COMP TR BAND LRG (VASCULAR PRODUCTS) ×2 IMPLANT
GUIDEWIRE INQWIRE 1.5J.035X260 (WIRE) ×1 IMPLANT
INQWIRE 1.5J .035X260CM (WIRE) ×2
KIT HEART LEFT (KITS) ×2 IMPLANT
NEEDLE PERC 21GX4CM (NEEDLE) ×2 IMPLANT
PACK CARDIAC CATHETERIZATION (CUSTOM PROCEDURE TRAY) ×2 IMPLANT
SHEATH RAIN 4/5FR (SHEATH) ×2 IMPLANT
SHEATH RAIN RADIAL 21G 6FR (SHEATH) ×2 IMPLANT
TRANSDUCER W/STOPCOCK (MISCELLANEOUS) ×2 IMPLANT
TUBING CIL FLEX 10 FLL-RA (TUBING) ×2 IMPLANT
WIRE EMERALD 3MM-J .025X260CM (WIRE) ×2 IMPLANT
WIRE MICROINTRODUCER 60CM (WIRE) ×2 IMPLANT

## 2018-01-12 NOTE — Progress Notes (Addendum)
Patient ID: Amanda [name], female   DOB: 03-09-1981, 37 y.o.   MRN: [id number]     Advanced Heart Failure Rounding Note  PCP-Cardiologist: No primary care provider on file.   Subjective:    Brisk diuresis yesterday with -2.1 L out. Weight down 3 lbs. Creatinine stable.   No CP or dizziness. She is SOB from walking back and forth to the bathroom. Started her period yesterday.    Going for [facility] today.   Objective:   Weight Range: 298 lb 12.8 oz (135.5 kg) Body mass index is 48.23 kg/m.   Vital Signs:   Temp:  [97.4 F (36.3 C)-98.2 F (36.8 C)] 97.6 F (36.4 C) (06/03 0456) Pulse Rate:  [98-102] 102 (06/03 0456) Resp:  [18-24] 24 (06/03 0456) BP: (97-118)/(64-86) 118/86 (06/03 0456) SpO2:  [96 %-100 %] 96 % (06/03 0456) Weight:  [298 lb 12.8 oz (135.5 kg)] 298 lb 12.8 oz (135.5 kg) (06/03 0501) Last BM Date: 01/12/18  Weight change: Filed Weights   01/10/18 0543 01/11/18 0413 01/12/18 0501  Weight: (!) 307 lb 14.4 oz (139.7 kg) (!) 301 lb 11.2 oz (136.9 kg) 298 lb 12.8 oz (135.5 kg)    Intake/Output:   Intake/Output Summary (Last 24 hours) at 01/12/2018 0852 Last data filed at 01/12/2018 0600 Gross per 24 hour  Intake 1200 ml  Output 3550 ml  Net -2350 ml      Physical Exam    General: Well appearing. No resp difficulty. Sitting on side of bed.  HEENT: Normal Neck: Supple. JVP ~10. Carotids 2+ bilat; no bruits. No thyromegaly or nodule noted. Cor: PMI nondisplaced. RRR, tachy, No M/G/R noted +S3 Lungs: CTAB, normal effort. Abdomen: Soft, non-tender, non-distended, no HSM. No bruits or masses. +BS  Extremities: No cyanosis, clubbing, or rash. R and LLE no edema.  Neuro: Alert & orientedx3, cranial nerves grossly intact. moves all 4 extremities w/o difficulty. Affect pleasant   Telemetry   NSR/Sinus tach 90-110s  Labs    CBC Recent Labs    01/09/18 1313 01/12/18 0447  WBC 4.4 4.5  NEUTROABS  --  2.2  HGB 12.0 12.4  HCT 39.3 40.6  MCV 82.6 80.6    PLT 270 195   Basic Metabolic Panel Recent Labs    01/11/18 0629 01/12/18 0447  NA 141 139  K 3.6 3.6  CL 105 108  CO2 27 24  GLUCOSE 101* 107*  BUN 9 12  CREATININE 0.80 0.77  CALCIUM 8.9 8.6*   Liver Function Tests No results for input(s): AST, ALT, ALKPHOS, BILITOT, PROT, ALBUMIN in the last 72 hours. No results for input(s): LIPASE, AMYLASE in the last 72 hours. Cardiac Enzymes No results for input(s): CKTOTAL, CKMB, CKMBINDEX, TROPONINI in the last 72 hours.  BNP: BNP (last 3 results) Recent Labs    01/09/18 1313  BNP 1,069.8*    ProBNP (last 3 results) No results for input(s): PROBNP in the last 8760 hours.   D-Dimer No results for input(s): DDIMER in the last 72 hours. Hemoglobin A1C No results for input(s): HGBA1C in the last 72 hours. Fasting Lipid Panel No results for input(s): CHOL, HDL, LDLCALC, TRIG, CHOLHDL, LDLDIRECT in the last 72 hours. Thyroid Function Tests Recent Labs    01/10/18 1151  TSH 1.006    Other results:   Imaging    No results found.   Medications:     Scheduled Medications: . carvedilol  3.125 mg Oral BID WC  . enoxaparin (LOVENOX) injection  40 mg  Subcutaneous Q24H  . ferrous sulfate  325 mg Oral Q breakfast  . furosemide  40 mg Intravenous BID  . sacubitril-valsartan  1 tablet Oral BID  . sodium chloride flush  3 mL Intravenous Q12H  . sodium chloride flush  3 mL Intravenous Q12H  . spironolactone  25 mg Oral Daily  . Teriflunomide  14 mg Oral Daily    Infusions: . sodium chloride    . sodium chloride    . sodium chloride 10 mL/hr at 01/12/18 0804    PRN Medications: sodium chloride, sodium chloride, acetaminophen, ondansetron (ZOFRAN) IV, sodium chloride flush, sodium chloride flush  Assessment/Plan   1. Acute systolic CHF:  NYHA class III symptoms.  Mild pulmonary edema on CXR and elevated BNP.  Echo 5/31 with EF 10-15%, moderate MR.  Cause of CHF is uncertain.  Mother developed CHF in her 12s but  no other family members have CHF that she knows of (has several healthy siblings).  She does not drink ETOH or use drugs.  No children, has not been pregnant so not peri-partum CMP.  TSH normal.  It is possible that this is a viral cardiomyopathy.  We have not ruled out CAD. She is volume overloaded on exam still with sinus tachycardia and S3, but volume status is improving. - Give Lasix 40 mg x1 this am, then reassess with RHC.  - Continue Entresto to 49/51 bid.   - Continue spironolactone to 25 daily.   - Continue Coreg 3.125 mg bid.  Will either increase Coreg next or add ivabradine, will see what cardiac output is on RHC.   - She will need cardiac MRI. This has been ordered. - R/LHC today 2. History of uterine fibroids with bleeding/anemia: This seems quiescent currently. No change.  3. Multiple sclerosis: Quiescent currently.  Aubagio, her MS medication, does not appear to have CHF as a side effect. Only cardiac side effect appears to be HTN. No change.  - SBP 97-130. Denies dizziness.   R/LHC today.   Length of Stay: [place], NP  01/12/2018, 8:52 AM  Advanced Heart Failure Team Pager [phone] (M-F; 7a - 4p)  Please contact [facility] for night-coverage after hours (4p -7a ) and weekends on [email or web address]  Patient seen with NP, agree with the above note.  Will plan RHC/LHC today.  Will decide on further diuresis based on filling pressures.  If cardiac output looks reasonable, will increase Coreg today.  Will need cardiac MRI (ordered).   Loralie Champagne 01/12/2018 10:02 AM

## 2018-01-12 NOTE — Progress Notes (Signed)
Benefit check for Entresto in progress; B Jailyne Chieffo RN,MHA,BSN [phone] 

## 2018-01-13 LAB — CBC WITH DIFFERENTIAL/PLATELET
Abs Immature Granulocytes: 0 10*3/uL (ref 0.0–0.1)
Basophils Absolute: 0.1 10*3/uL (ref 0.0–0.1)
Basophils Relative: 2 %
Eosinophils Absolute: 0.4 10*3/uL (ref 0.0–0.7)
Eosinophils Relative: 11 %
HCT: 40.3 % (ref 36.0–46.0)
Hemoglobin: 12.3 g/dL (ref 12.0–15.0)
Immature Granulocytes: 0 %
Lymphocytes Relative: 37 %
Lymphs Abs: 1.3 10*3/uL (ref 0.7–4.0)
MCH: 25.2 pg — ABNORMAL LOW (ref 26.0–34.0)
MCHC: 30.5 g/dL (ref 30.0–36.0)
MCV: 82.4 fL (ref 78.0–100.0)
Monocytes Absolute: 0.4 10*3/uL (ref 0.1–1.0)
Monocytes Relative: 10 %
Neutro Abs: 1.4 10*3/uL — ABNORMAL LOW (ref 1.7–7.7)
Neutrophils Relative %: 40 %
Platelets: 265 10*3/uL (ref 150–400)
RBC: 4.89 MIL/uL (ref 3.87–5.11)
RDW: 15 % (ref 11.5–15.5)
WBC: 3.6 10*3/uL — ABNORMAL LOW (ref 4.0–10.5)

## 2018-01-13 LAB — BASIC METABOLIC PANEL
Anion gap: 10 (ref 5–15)
BUN: 9 mg/dL (ref 6–20)
CO2: 24 mmol/L (ref 22–32)
Calcium: 8.9 mg/dL (ref 8.9–10.3)
Chloride: 106 mmol/L (ref 101–111)
Creatinine, Ser: 0.71 mg/dL (ref 0.44–1.00)
GFR calc Af Amer: >60 mL/min (ref >60)
GFR calc non Af Amer: >60 mL/min (ref >60)
Glucose, Bld: 99 mg/dL (ref 65–99)
Potassium: 3.8 mmol/L (ref 3.5–5.1)
Sodium: 140 mmol/L (ref 135–145)

## 2018-01-13 MED ORDER — SACUBITRIL-VALSARTAN 49-51 MG PO TABS: 1.0000 | ORAL_TABLET | Freq: Two times a day (BID) | ORAL | 6 refills | Status: DC

## 2018-01-13 MED ORDER — SPIRONOLACTONE 25 MG PO TABS: 25.0000 mg | ORAL_TABLET | Freq: Every day | ORAL | 6 refills | Status: DC

## 2018-01-13 MED ORDER — CARVEDILOL 6.25 MG PO TABS: 6.2500 mg | ORAL_TABLET | Freq: Two times a day (BID) | ORAL | Status: DC

## 2018-01-13 MED ORDER — FUROSEMIDE 40 MG PO TABS: 40.0000 mg | ORAL_TABLET | Freq: Every day | ORAL | 6 refills | Status: DC

## 2018-01-13 MED ORDER — CARVEDILOL 6.25 MG PO TABS: 6.2500 mg | ORAL_TABLET | Freq: Two times a day (BID) | ORAL | 6 refills | Status: DC

## 2018-01-13 MED ORDER — POTASSIUM CHLORIDE CRYS ER 20 MEQ PO TBCR: 20.0000 meq | EXTENDED_RELEASE_TABLET | Freq: Every day | ORAL | 6 refills | Status: DC

## 2018-01-13 MED ORDER — POTASSIUM CHLORIDE CRYS ER 20 MEQ PO TBCR
20.0000 meq | EXTENDED_RELEASE_TABLET | Freq: Every day | ORAL | Status: DC
  Administered 2018-01-13: 20 meq via ORAL
  Filled 2018-01-13: qty 1

## 2018-01-13 NOTE — Discharge Summary (Signed)
Advanced Heart Failure Discharge Note  Discharge Summary   Patient ID: Amanda [name] MRN: [id number], DOB/AGE: 36-14-1982 36 y.o. Admit date: 01/09/2018 D/C date:     01/13/2018   Primary Discharge Diagnoses:  1. Acute systolic HF 2. History uterine fibroids with bleeding/anemia 3. Multiple sclerosis   Hospital Course: Amanda [name] is a 37 y.o. female with a history of multiple sclerosis and anemia due to uterine fibroids.   Admitted from [facility] with volume overload and found to have acute systolic heart failure. Echo showed EF 10-15%. HF team consulted. R/LHC completed, which showed no CAD and preserved CO. Cardiac MRI showed no LGE, EF 14%. She was diuresed with IV lasix and transitioned to lasix 40 mg daily. HF meds were optimized.  1. Acute systolic CHF: Echo 7/98 with EF 10-15%, moderate MR. Due to NICM. 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 Mother developed CHF in her 68s but no other family members have CHF that she knows of (has several healthy siblings). She does not drink ETOH or use drugs. No children, has not been pregnant so not peri-partum CMP. TSH normal. - LHC 01/12/18 with no CAD, RHC 01/12/18 with normal filling pressures and preserved CO.  - Cardiac MRI 01/12/18 did not show LGE, EF 14%.  - Diuresed with IV lasix and transitioned to lasix 40 mg daily. BMET was monitored.  - Continue Entresto 49/51 bid. Pharmacy card was provided for first month free. Co-pay is $80 through her insurance - she may need help with affording this in the future.  - Continue spironolactone 25 daily.   - Continue coreg 6.25 mg BID  - Did not start digoxin with preserved CO per Dr Aundra Dubin. - Educated on daily weights, limiting fluid, and limiting salt intake.   2. History of uterine fibroids with bleeding/anemia:  - CBC monitored throughout admission. No drop in hemoglobin.   3. Multiple sclerosis: Quiescent currently. Aubagio, her MS medication, does not appear to have CHF as a side effect.  Only cardiac side effect appears to be HTN. - Blood pressure monitored and managed with HF medications.   She will be followed closely in the heart failure clinic, with appointment as below. Work note was provided through 01/20/18 (HF follow up appointment). She will need a referral to cardiac rehab at follow up.   Discharge Weight Range: 299 lbs Discharge Vitals: Blood pressure (!) 113/97, pulse (!) 101, temperature 98 F (36.7 C), temperature source Oral, resp. rate 18, height 5\' 6"  (1.676 m), weight 299 lb 1.6 oz (135.7 kg), last menstrual period 12/14/2017, SpO2 100 %.  Labs: Lab Results  Component Value Date   WBC 3.6 (L) 01/13/2018   HGB 12.3 01/13/2018   HCT 40.3 01/13/2018   MCV 82.4 01/13/2018   PLT 265 01/13/2018    Recent Labs  Lab 01/13/18 0540  NA 140  K 3.8  CL 106  CO2 24  BUN 9  CREATININE 0.71  CALCIUM 8.9  GLUCOSE 99   No results found for: CHOL, HDL, LDLCALC, TRIG BNP (last 3 results) Recent Labs    01/09/18 1313  BNP 1,069.8*    ProBNP (last 3 results) No results for input(s): PROBNP in the last 8760 hours.   Diagnostic Studies/Procedures   Echo 01/09/18: EF 10-15%, moderate MR  Cardiac MRI 01/12/18: 1. Severely dilated LV with diffuse hypokinesis, EF 14%. 2. Mildly dilated RV with mildly decreased systolic function. 3. No myocardial LGE, so no definitive evidence for prior MI, infiltrative disease, or myocarditis.  LHC 01/12/18: No angiographic CAD  RHC 01/12/18: RA mean 5 RV 28/8 PA 30/13, mean 22 PCWP mean 9 Oxygen saturations: PA 66% AO 96% Cardiac Output (Fick) 6.23  Cardiac Index (Fick) 2.63  Discharge Medications   Allergies as of 01/13/2018   No Known Allergies     Medication List    TAKE these medications   AUBAGIO 14 MG Tabs Generic drug:  Teriflunomide Take 14 mg by mouth daily.   carvedilol 6.25 MG tablet Commonly known as:  COREG Take 1 tablet (6.25 mg total) by mouth 2 (two) times daily with a meal.     ferrous sulfate 325 (65 FE) MG tablet Take 1 tablet (325 mg total) by mouth daily with breakfast.   furosemide 40 MG tablet Commonly known as:  LASIX Take 1 tablet (40 mg total) by mouth daily. Start taking on:  01/14/2018 What changed:    medication strength  how much to take  when to take this   potassium chloride SA 20 MEQ tablet Commonly known as:  K-DUR,KLOR-CON Take 1 tablet (20 mEq total) by mouth daily. Start taking on:  01/14/2018   sacubitril-valsartan 49-51 MG Commonly known as:  ENTRESTO Take 1 tablet by mouth 2 (two) times daily.   spironolactone 25 MG tablet Commonly known as:  ALDACTONE Take 1 tablet (25 mg total) by mouth daily. Start taking on:  01/14/2018       Disposition   The patient will be discharged in stable condition to home. Discharge Instructions    (HEART FAILURE PATIENTS) Call MD:  Anytime you have any of the following symptoms: 1) 3 pound weight gain in 24 hours or 5 pounds in 1 week 2) shortness of breath, with or without a dry hacking cough 3) swelling in the hands, feet or stomach 4) if you have to sleep on extra pillows at night in order to breathe.   Complete by:  As directed    Call MD for:  extreme fatigue   Complete by:  As directed    Call MD for:  persistant dizziness or light-headedness   Complete by:  As directed    Diet - low sodium heart healthy   Complete by:  As directed    Heart Failure patients record your daily weight using the same scale at the same time of day   Complete by:  As directed    Increase activity slowly   Complete by:  As directed    STOP any activity that causes chest pain, shortness of breath, dizziness, sweating, or exessive weakness   Complete by:  As directed      Follow-up Information    [facility]. Go on 01/20/2018.   Specialty:  Cardiology Why:  12:00 PM, Advanced Heart Failure Clinic, parking code 1300 Contact information: [address] [id number] [place] [address] [place], [place], [place]. Go on 01/21/2018.   Specialty:  Family Medicine Why:  @11 :45am Contact information: [place] [address] [phone]             Duration of Discharge Encounter: Greater than 35 minutes   Signed, Georgiana Shore, NP 01/13/2018, 1:42 PM

## 2018-01-13 NOTE — Progress Notes (Addendum)
Patient ID: Amanda [name], female   DOB: [birth year], 37 y.o.   MRN: [id number]     Advanced Heart Failure Rounding Note  PCP-Cardiologist: No primary care provider on file.   Subjective:    -900 mls UOP. Weight up 1 lb. Creatinine stable 0.71  Denies CP, SOB, orthopnea. Hopeful to go home today.   Cardiac MRI 01/12/18: 1.  Severely dilated LV with diffuse hypokinesis, EF 14%. 2.  Mildly dilated RV with mildly decreased systolic function. 3. No myocardial LGE, so no definitive evidence for prior MI, infiltrative disease, or myocarditis.  LHC 01/12/18: No angiographic CAD  RHC 01/12/18: RA mean 5 RV 28/8 PA 30/13, mean 22 PCWP mean 9  Oxygen saturations: PA 66% AO 96%  Cardiac Output (Fick) 6.23  Cardiac Index (Fick) 2.63  Objective:   Weight Range: 299 lb 1.6 oz (135.7 kg) Body mass index is 48.28 kg/m.   Vital Signs:   Temp:  [97.5 F (36.4 C)-98 F (36.7 C)] 97.7 F (36.5 C) (06/04 0448) Pulse Rate:  [55-113] 94 (06/04 0448) Resp:  [12-20] 20 (06/03 2333) BP: (97-141)/(61-93) 97/61 (06/04 0448) SpO2:  [95 %-100 %] 98 % (06/04 0448) Weight:  [299 lb 1.6 oz (135.7 kg)] 299 lb 1.6 oz (135.7 kg) (06/04 0456) Last BM Date: 01/12/18  Weight change: Filed Weights   01/11/18 0413 01/12/18 0501 01/13/18 0456  Weight: (!) 301 lb 11.2 oz (136.9 kg) 298 lb 12.8 oz (135.5 kg) 299 lb 1.6 oz (135.7 kg)    Intake/Output:   Intake/Output Summary (Last 24 hours) at 01/13/2018 0824 Last data filed at 01/13/2018 0300 Gross per 24 hour  Intake 480 ml  Output 900 ml  Net -420 ml      Physical Exam    General: Well appearing. No resp difficulty. HEENT: Normal Neck: Supple. JVP flat. Carotids 2+ bilat; no bruits. No thyromegaly or nodule noted. Cor: PMI nondisplaced. RRR, No M/G/R noted, no S3 today.  Lungs: CTAB, normal effort. Abdomen: Soft, non-tender, non-distended, no HSM. No bruits or masses. +BS  Extremities: No cyanosis, clubbing, or rash. R and LLE no edema.    Neuro: Alert & orientedx3, cranial nerves grossly intact. moves all 4 extremities w/o difficulty. Affect pleasant   Telemetry   NSR/sinus tach 80-100s. Personally reviewed.   Labs    CBC Recent Labs    01/12/18 0447 01/13/18 0540  WBC 4.5 3.6*  NEUTROABS 2.2 1.4*  HGB 12.4 12.3  HCT 40.6 40.3  MCV 80.6 82.4  PLT 260 824   Basic Metabolic Panel Recent Labs    01/12/18 0447 01/13/18 0540  NA 139 140  K 3.6 3.8  CL 108 106  CO2 24 24  GLUCOSE 107* 99  BUN 12 9  CREATININE 0.77 0.71  CALCIUM 8.6* 8.9   Liver Function Tests No results for input(s): AST, ALT, ALKPHOS, BILITOT, PROT, ALBUMIN in the last 72 hours. No results for input(s): LIPASE, AMYLASE in the last 72 hours. Cardiac Enzymes No results for input(s): CKTOTAL, CKMB, CKMBINDEX, TROPONINI in the last 72 hours.  BNP: BNP (last 3 results) Recent Labs    01/09/18 1313  BNP 1,069.8*    ProBNP (last 3 results) No results for input(s): PROBNP in the last 8760 hours.   D-Dimer No results for input(s): DDIMER in the last 72 hours. Hemoglobin A1C No results for input(s): HGBA1C in the last 72 hours. Fasting Lipid Panel No results for input(s): CHOL, HDL, LDLCALC, TRIG, CHOLHDL, LDLDIRECT in the last 72 hours.  Thyroid Function Tests Recent Labs    01/10/18 1151  TSH 1.006    Other results:   Imaging    Mr Cardiac Morphology W Wo Contrast  Result Date: 01/12/2018 CLINICAL DATA:  Cardiomyopathy of uncertain etiology EXAM: CARDIAC MRI TECHNIQUE: The patient was scanned on a 1.5 Tesla GE magnet. A dedicated cardiac coil was used. Functional imaging was done using Fiesta sequences. 2,3, and 4 chamber views were done to assess for RWMA's. Modified Simpson's rule using a short axis stack was used to calculate an ejection fraction on a dedicated work Conservation officer, nature. The patient received 38 cc of Multihance. After 10 minutes inversion recovery sequences were used to assess for infiltration  and scar tissue. CONTRAST:  38 cc Multihance FINDINGS: Limited images of the lung fields showed no gross abnormalities. Small pericardial effusion. Severely dilated left ventricle with normal wall thickness. Diffuse severe hypokinesis, EF 14%. No evidence for LV noncompaction. Mildly dilated right ventricle with mildly decreased systolic function. Mildly dilated left atrium. Normal right atrial size. Mild mitral regurgitation. Trileaflet aortic valve with no regurgitation or stenosis. Delayed enhancement imaging showed no definite late gadolinium enhancement (LGE). Measurements: LVEDV 341 mL LVSV 47 mL LVEF 14% IMPRESSION: 1.  Severely dilated LV with diffuse hypokinesis, EF 14%. 2.  Mildly dilated RV with mildly decreased systolic function. 3. No myocardial LGE, so no definitive evidence for prior MI, infiltrative disease, or myocarditis. Deveney Bayon Electronically Signed   By: Loralie Champagne M.D.   On: 01/12/2018 16:05     Medications:     Scheduled Medications: . carvedilol  3.125 mg Oral BID WC  . ferrous sulfate  325 mg Oral Q breakfast  . furosemide  40 mg Oral Daily  . heparin  5,000 Units Subcutaneous Q8H  . sacubitril-valsartan  1 tablet Oral BID  . sodium chloride flush  3 mL Intravenous Q12H  . sodium chloride flush  3 mL Intravenous Q12H  . spironolactone  25 mg Oral Daily  . Teriflunomide  14 mg Oral Daily    Infusions: . sodium chloride    . sodium chloride      PRN Medications: sodium chloride, sodium chloride, acetaminophen, ondansetron (ZOFRAN) IV, sodium chloride flush, sodium chloride flush  Assessment/Plan   1. Acute systolic CHF:  NYHA class III symptoms.  Mild pulmonary edema on CXR and elevated BNP.  Echo 5/31 with EF 10-15%, moderate MR.  Cause of CHF is uncertain.  Mother developed CHF in her 16s but no other family members have CHF that she knows of (has several healthy siblings).  She does not drink ETOH or use drugs.  No children, has not been pregnant so  not peri-partum CMP.  TSH normal.  It is possible that this is a viral cardiomyopathy. [place] 01/12/18 with no CAD, RHC 01/12/18 with normal filling pressures and preserved CO. Cardiac MRI did not show LGE, EF 14%.  - Volume status stable on exam and on RHC yesterday.  - Continue lasix 40 mg daily - Continue Entresto 49/51 bid.   - Continue spironolactone 25 daily.   - Increase coreg to 6.25 mg BID  2. History of uterine fibroids with bleeding/anemia: This seems quiescent currently. No change.  3. Multiple sclerosis: Quiescent currently.  Aubagio, her MS medication, does not appear to have CHF as a side effect. Only cardiac side effect appears to be HTN. No change.  - SBP 97-120s. No dizziness.   Anticipate discharge today.  Length of Stay: [place]  Koren Bound, NP  01/13/2018, 8:24 AM  Advanced Heart Failure Team Pager [phone] (M-F; 7a - 4p)  Please contact [facility] for night-coverage after hours (4p -7a ) and weekends on [email or web address]  Patient seen with NP, agree with the above note.  She feels good today.  Despite low EF, RHC yesterday showed preserved cardiac output and low filling pressures.  She has had no arrhythmias.  HR is lower, mainly in 90s, and volume status is much improved (down about 14 lbs).   Most likely cardiomyopathy is due to prior viral myocarditis.  She will need close followup in CHF clinic.  She will need repeat echo in 5-6 months for ICD.  Not CRT candidate with narrow QRS.   Meds for home: Lasix 40 mg daily KCl 20 daily Entresto 49/51 bid Spironolactone 25 daily Coreg 6.25 mg bid  Loralie Champagne 01/13/2018 8:50 AM

## 2018-01-13 NOTE — Progress Notes (Addendum)
RE: Benefit check  Received: Yesterday ; patient was given the [facility] coupon card with instruction of usage Message Contents  Memory Argue CMA        # 8. S/W [facility] @ [facility] RX # [phone]    1. ENTRESTO  24-26 MG  BID  COVER- YES  CO-PAY- $ 80.00  PRIOR APPROVAL- NO   2. SACUBITRIL-VALSARTON: NONE FORMULARY    DEDUCTIBLE HAS BEEN MET   PREFERRED PHARMACY : YES- [facility]

## 2018-01-13 NOTE — Progress Notes (Signed)
Discharged patient home with family.  Discussed  Heart Failure booklet, follow up appointments and medications.  Sent home some education material on [place].  IV removed- site intact.

## 2018-01-13 NOTE — Progress Notes (Signed)
Patient would like a letter for work- paged doctor.

## 2018-01-19 NOTE — Progress Notes (Signed)
Advanced Heart Failure Clinic Note   Referring Physician: PCP: [name], PA PCP-Cardiologist: Dr Aundra Dubin  HPI: Amanda [name] is a 37 y.o. female with a history of multiple sclerosis, anemia due to uterine fibroids, and newly diagnosed systolic HF (0/0867).  Admitted to [facility] 5/31-01/13/18  with volume overload and found to have acute systolic heart failure. Echo showed EF 10-15%. HF team consulted. R/LHC completed, which showed no CAD and preserved CO. Cardiac MRI showed no LGE, EF 14%. She was diuresed with IV lasix and transitioned to lasix 40 mg daily. HF meds were optimized. DC weight: 299 lbs.  She presents today with her mother for post hospital follow up. Overall doing okay. She gets SOB if she walks a long distance or tries to walk quickly. She has not tried stairs. Denies orthopnea, PND, or edema. She gets mildly dizzy with standing quickly in the morning. This happens most mornings. She is limiting her salt intake and trying to drink very little. She does not have a scale at home, but weight is down 4 lbs on our scale. She is asking about going back to work today. She works with adults with developmental needs. No heavy lifting, but says it can be a very stressful job. She plans to talk to her boss about work part time.   Review of systems complete and found to be negative unless listed in HPI.   Past Medical History:  Diagnosis Date  . Anemia   . BP (high blood pressure)   . History of blood transfusion 08/2017   "related to low blood count"   . Multiple sclerosis ([place])   . Pneumonia 12/2017    Current Outpatient Medications  Medication Sig Dispense Refill  . carvedilol (COREG) 6.25 MG tablet Take 1 tablet (6.25 mg total) by mouth 2 (two) times daily with a meal. 60 tablet 6  . ferrous sulfate 325 (65 FE) MG tablet Take 1 tablet (325 mg total) by mouth daily with breakfast. 30 tablet 3  . furosemide (LASIX) 40 MG tablet Take 1 tablet (40 mg total) by mouth daily. 30  tablet 6  . potassium chloride SA (K-DUR,KLOR-CON) 20 MEQ tablet Take 1 tablet (20 mEq total) by mouth daily. 30 tablet 6  . sacubitril-valsartan (ENTRESTO) 49-51 MG Take 1 tablet by mouth 2 (two) times daily. 60 tablet 6  . spironolactone (ALDACTONE) 25 MG tablet Take 1 tablet (25 mg total) by mouth daily. 30 tablet 6  . Teriflunomide (AUBAGIO) 14 MG TABS Take 14 mg by mouth daily.     No current facility-administered medications for this encounter.     No Known Allergies    Social History   Socioeconomic History  . Marital status: Married    Spouse name: Not on file  . Number of children: Not on file  . Years of education: Not on file  . Highest education level: Not on file  Occupational History  . Not on file  Social Needs  . Financial resource strain: Not on file  . Food insecurity:    Worry: Not on file    Inability: Not on file  . Transportation needs:    Medical: Not on file    Non-medical: Not on file  Tobacco Use  . Smoking status: Never Smoker  . Smokeless tobacco: Never Used  Substance and Sexual Activity  . Alcohol use: Never    Frequency: Never  . Drug use: Never  . Sexual activity: Yes  Lifestyle  . Physical activity:  Days per week: Not on file    Minutes per session: Not on file  . Stress: Not on file  Relationships  . Social connections:    Talks on phone: Not on file    Gets together: Not on file    Attends religious service: Not on file    Active member of club or organization: Not on file    Attends meetings of clubs or organizations: Not on file    Relationship status: Not on file  . Intimate partner violence:    Fear of current or ex partner: Not on file    Emotionally abused: Not on file    Physically abused: Not on file    Forced sexual activity: Not on file  Other Topics Concern  . Not on file  Social History Narrative  . Not on file      Family History  Problem Relation Age of Onset  . Diabetes Mother   . Hypertension  Mother   . Fibroids Sister     Vitals:   01/20/18 1215  BP: 104/68  Pulse: 96  SpO2: 100%  Weight: 295 lb 6 oz (134 kg)   Wt Readings from Last 3 Encounters:  01/20/18 295 lb 6 oz (134 kg)  01/13/18 299 lb 1.6 oz (135.7 kg)  12/19/17 (!) 305 lb (138.3 kg)   Orthostatics: Sitting: 92/60 Standing: 90/60  PHYSICAL EXAM: General:  Well appearing. No respiratory difficulty HEENT: normal Neck: supple. JVP 6-7. Carotids 2+ bilat; no bruits. No lymphadenopathy or thyromegaly appreciated. Cor: PMI nondisplaced. Regular rate & rhythm. No rubs, gallops or murmurs. Lungs: clear Abdomen: soft, nontender, nondistended. No hepatosplenomegaly. No bruits or masses. Good bowel sounds. Extremities: no cyanosis, clubbing, rash, edema Neuro: alert & oriented x 3, cranial nerves grossly intact. moves all 4 extremities w/o difficulty. Affect pleasant.  ASSESSMENT & PLAN:  1. Acute systolic CHF: Echo 3/01 with EF 10-15%, moderate MR. Due to NICM. 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 Mother developed CHF in her 39s but no other family members have CHF that she knows of (has several healthy siblings). She does not drink ETOH or use drugs. No children, has not been pregnant so not peri-partum CMP. TSH normal. - LHC 01/12/18 with no CAD, RHC 01/12/18 with normal filling pressures and preserved CO.  - Cardiac MRI 01/12/18 did not show LGE, EF 14%.  - Continue lasix 40 mg daily. BMET today - Continue Entresto 49/51 bid. She has $10 copay card. Encouraged her to contact us if she has problems getting this.  - Continue spironolactone 25 daily.   - Continue coreg 6.25 mg BID. Unable to increase today with soft BP.   - No digoxin with preserved CO per Dr Aundra Dubin. - Reinforced importance daily weights, limiting fluid, and limiting salt intake. Provided a scale today.  - Refer to cardiac rehab - Repeat echo 3 months with Dr Aundra Dubin. We discussed that she may need ICD if EF remains < 35%.  - She was provided a  work note for this week. 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   2. History of uterine fibroids with bleeding/anemia:  - Denies bleeding.   3. Multiple sclerosis: Quiescent currently. Aubagio, her MS medication, does not appear to have CHF as a side effect. Only cardiac side effect appears to be HTN. - Followed by Dr Blanch Media  Refer to cardiac rehab BMET today No BP room to  adjust medications today.  Follow up with pharmacy in 3-4 weeks Follow up with APP 6-8 weeks Repeat echo in 3 months with Dr Matilde Bash, NP 01/20/18  Greater than 50% of the 25 minute visit was spent in counseling/coordination of care regarding disease state education, salt/fluid restriction, sliding scale diuretics, and medication compliance.

## 2018-01-20 ENCOUNTER — Encounter (HOSPITAL_COMMUNITY): Payer: Self-pay

## 2018-01-20 ENCOUNTER — Ambulatory Visit (HOSPITAL_COMMUNITY)
Admit: 2018-01-20 | Discharge: 2018-01-20 | Disposition: A | Discharge status: Home / Self Care | Payer: PRIVATE HEALTH INSURANCE | Attending: Cardiology | Admitting: Cardiology

## 2018-01-20 VITALS — BP 104/68 | HR 96 | Wt 295.4 lb

## 2018-01-20 DIAGNOSIS — I5023 Acute on chronic systolic (congestive) heart failure: Secondary | ICD-10-CM | POA: Insufficient documentation

## 2018-01-20 DIAGNOSIS — Z8249 Family history of ischemic heart disease and other diseases of the circulatory system: Secondary | ICD-10-CM | POA: Diagnosis not present

## 2018-01-20 DIAGNOSIS — I5022 Chronic systolic (congestive) heart failure: Secondary | ICD-10-CM

## 2018-01-20 DIAGNOSIS — D259 Leiomyoma of uterus, unspecified: Secondary | ICD-10-CM | POA: Diagnosis not present

## 2018-01-20 DIAGNOSIS — G35 Multiple sclerosis: Secondary | ICD-10-CM | POA: Diagnosis not present

## 2018-01-20 DIAGNOSIS — I429 Cardiomyopathy, unspecified: Secondary | ICD-10-CM | POA: Diagnosis not present

## 2018-01-20 DIAGNOSIS — I428 Other cardiomyopathies: Secondary | ICD-10-CM

## 2018-01-20 DIAGNOSIS — D649 Anemia, unspecified: Secondary | ICD-10-CM | POA: Insufficient documentation

## 2018-01-20 DIAGNOSIS — Z79899 Other long term (current) drug therapy: Secondary | ICD-10-CM | POA: Insufficient documentation

## 2018-01-20 LAB — BASIC METABOLIC PANEL
Anion gap: 7 (ref 5–15)
BUN: 13 mg/dL (ref 6–20)
CO2: 25 mmol/L (ref 22–32)
Calcium: 9.3 mg/dL (ref 8.9–10.3)
Chloride: 108 mmol/L (ref 101–111)
Creatinine, Ser: 0.88 mg/dL (ref 0.44–1.00)
GFR calc Af Amer: >60 mL/min (ref >60)
GFR calc non Af Amer: >60 mL/min (ref >60)
Glucose, Bld: 83 mg/dL (ref 65–99)
Potassium: 4.3 mmol/L (ref 3.5–5.1)
Sodium: 140 mmol/L (ref 135–145)

## 2018-01-20 NOTE — Patient Instructions (Addendum)
Routine lab work today. Will notify you of abnormal results, otherwise no news is good news!  Will refer you to Cardiac Rehab at [facility]. They will call you to set up initial appointment.  Follow up 3-4 weeks with CHF pharmacist.  __________________________________________________________________ Amanda [name] Code: 1400  Follow up 6-8 weeks with Lillia Mountain NP-C.  ___________________________________________________________________ Amanda [name] Code: 1500  Follow up 3 months with Dr. Aundra Dubin and echocardiogram.  ____________________________________________________________________ Amanda [name] Code: 4562  Take all medication as prescribed the day of your appointment. Bring all medications with you to your appointment.  Do the following things EVERYDAY: 1) Weigh yourself in the morning before breakfast. Write it down and keep it in a log. 2) Take your medicines as prescribed 3) Eat low salt foods-Limit salt (sodium) to 2000 mg per day.  4) Stay as active as you can everyday 5) Limit all fluids for the day to less than 2 liters

## 2018-01-30 ENCOUNTER — Telehealth (HOSPITAL_COMMUNITY): Payer: Self-pay

## 2018-01-30 NOTE — Telephone Encounter (Signed)
Called pt to see if she would like to proceed with scheduling for CR, she stated yes. Pt will come in for orientation on 03/10/18 @ 7:30AM and will attend the 6:45AM exer. Class.  Mailed out homework package.

## 2018-01-30 NOTE — Telephone Encounter (Signed)
Pt insurance is active and benefits verified with [employer]. Co-pay $0.00, DED $1,950.00/$1,950.00 met, out of pocket $5,950.00/$5,950.00 met, co-insurance 0%. No pre-authorization required. [employer]/Karen, 01/30/18 @ 2:45PM, BBU#[id number]

## 2018-02-09 ENCOUNTER — Ambulatory Visit (HOSPITAL_COMMUNITY)
Admission: RE | Admit: 2018-02-09 | Discharge: 2018-02-09 | Disposition: A | Discharge status: Home / Self Care | Payer: PRIVATE HEALTH INSURANCE | Source: Ambulatory Visit | Attending: Internal Medicine | Admitting: Internal Medicine

## 2018-02-09 DIAGNOSIS — D500 Iron deficiency anemia secondary to blood loss (chronic): Secondary | ICD-10-CM | POA: Insufficient documentation

## 2018-02-09 DIAGNOSIS — D259 Leiomyoma of uterus, unspecified: Secondary | ICD-10-CM | POA: Insufficient documentation

## 2018-02-09 DIAGNOSIS — I5022 Chronic systolic (congestive) heart failure: Secondary | ICD-10-CM | POA: Insufficient documentation

## 2018-02-09 DIAGNOSIS — G35 Multiple sclerosis: Secondary | ICD-10-CM | POA: Insufficient documentation

## 2018-02-09 NOTE — Patient Instructions (Addendum)
Continue Current medications  Call if lightheaded or dizzy again Continue to walk as you can  Follow Up appointment 03/13/18 at 10:30

## 2018-02-09 NOTE — Progress Notes (Signed)
Referring Physician: PCP: [name], PA PCP-Cardiologist: Dr Aundra Dubin   HPI:   Amanda [name] is a 37 y.o. female with a history of multiple sclerosis, anemia due to uterine fibroids, and newly diagnosed systolic HF (01/3015).  Admitted to [facility] 5/31-01/13/18  with volume overload and found to have acute systolic heart failure. Echo showed EF 10-15%. HF team consulted. R/LHC completed, which showed no CAD and preserved CO. Cardiac MRI showed no LGE, EF 14%. She was diuresed with IV lasix and transitioned to lasix 40 mg daily. HF meds were optimized. DC weight: 299 lbs.  She had post hospital follow up. At that time she would get SOB if she walks a long distance or tried to walk quickly. She had not tried stairs. Denies orthopnea, PND, or edema. She gets mildly dizzy with standing quickly in the morning. This happens most mornings.  She is asking about going back to work. She works with adults with developmental needs. No heavy lifting, but says it can be a very stressful job. She plans to talk to her boss about work part time.   7/1 Presents today for initial pharmacist medication titration clinic.  She is feeling much better today than at last visit.  No SOB or lightheaded/dizziness  with ADL or walking, can walk around block without difficulty - does this at least once a day.  She is back to work part time and is active with adults with special needs.  She had 1 episode last Friday while outside at work where she felt lightheaded and weak and had to sit down for a few minutes until feeling passed.  No other simlar episodes over the weekend.  She is signed up for cardiac rehab. Weights at home consistent and follows low salt diet. At last visit continue HF Medications were continued with no changes as BP was soft.    . Shortness of breath/dyspnea on exertion? no  . Orthopnea/PND? no . Edema? no . Lightheadedness/dizziness? no . Daily weights at home? no . Blood pressure/heart rate monitoring  at home? no . Following low-sodium/fluid-restricted diet? yes  HF Medications: Carvedilol 6.25mg  BID Entresto 49-51mg  BID Furosemide 40mg  Daily Spironolactone 25mg  Daily  Has the patient been experiencing any side effects to the medications prescribed?  no  Does the patient have any problems obtaining medications due to transportation or finances?   no  Understanding of regimen: good Understanding of indications: good Potential of compliance: good Patient understands to avoid NSAIDs. Patient understands to avoid decongestants.    Pertinent Lab Values: 01/20/18 . Serum creatinine 0.8(stable) , CO2 25, Potassium 4.3, Sodium 140,    Vital Signs: . Weight: 295lb (dry weight: 294lb at home) . Blood pressure: 110/82 . Heart rate: 73 . O2 99%  Assessment: 1. Chronicsystolic CHF (EF 01% per MRI), due to NICM. NYHA class IIsymptoms. -volume status stable - no LE edema, abdominal distension, weight stable -BP soft 110/82 HR 73 - though dizziness has improved - except for single episode last week -  will give her time to adjust to this regimen without medication changes today.   -Spent time talking about planned medication regimen and dose titration.  Will call to determine BiDil copay as this would be potential future addition if BP remains stable without dizziness, lightheadedness. - Basic disease state pathophysiology, medication indication, mechanism and side effects reviewed at length with patient and he verbalized understanding   History of uterine fibroids with bleeding/anemia:  - Denies bleeding.   3. Multiple sclerosis:  Quiescent currently. Aubagio, her MS medication, does not appear to have CHF as a side effect. Only cardiac side effect appears to be HTN. - Followed by Dr Blanch Media   Plan: 1) Medication changes: Based on clinical presentation - volume status stable, vital signs BP soft HR 73 and recent labs K and Cr stable.   - with recent dizziness and soft BP will not  make changes today but discussed potential for addition of Bidil in near future - copay-  $0 - confirmed by [place] Will continue current HF medications Carvedilol 6.25mg  BID Entresto 49-51mg  BID Furosemide 40mg  Daily Spironolactone 25mg  Daily  2) Labs: with next appointment 3) Follow-up: with APP clinic 03/13/18  [name] Pharm.D. CPP, BCPS Clinical Pharmacist [phone] 02/09/2018 12:10 PM

## 2018-03-02 ENCOUNTER — Telehealth (HOSPITAL_COMMUNITY): Payer: Self-pay | Admitting: Pharmacist

## 2018-03-04 NOTE — Telephone Encounter (Signed)
Cardiac Rehab Medication Review by a Pharmacist  Does the patient  feel that his/her medications are working for him/her?  yes  Has the patient been experiencing any side effects to the medications prescribed?  no  Does the patient measure his/her own blood pressure or blood glucose at home?  no   Does the patient have any problems obtaining medications due to transportation or finances?   no  Understanding of regimen: good Understanding of indications: good Potential of compliance: good    Pharmacist comments: Patient states she does not have a blood pressure cuff at home yet.    Gwenlyn Found, Sherian Rein D PGY1 Pharmacy Resident  Phone [phone] 03/04/2018   2:08 PM

## 2018-03-10 ENCOUNTER — Inpatient Hospital Stay (HOSPITAL_COMMUNITY): Admission: RE | Admit: 2018-03-10 | Payer: PRIVATE HEALTH INSURANCE | Source: Ambulatory Visit

## 2018-03-12 NOTE — Progress Notes (Signed)
Advanced Heart Failure Clinic Note   Referring Physician: PCP: [name], PA PCP-Cardiologist: Dr Aundra Dubin  HPI: Amanda [name] is a 37 y.o. female with a history of multiple sclerosis, anemia due to uterine fibroids, and newly diagnosed systolic HF (09/4266).  Admitted to [facility] 5/31-01/13/18  with volume overload and found to have acute systolic heart failure. Echo showed EF 10-15%. HF team consulted. R/LHC completed, which showed no CAD and preserved CO. Cardiac MRI showed no LGE, EF 14%. She was diuresed with IV lasix and transitioned to lasix 40 mg daily. HF meds were optimized. DC weight: 299 lbs.  Today she returns for HF follow up wit her husband. .Overall feeling fine. Complaining of dizziness. Denies PND/Orthopnea. SOB with steps. Snores at night. Denies dizziness. Appetite ok. No fever or chills. Weight at home 296-298  pounds. Taking all medications. 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    CMRI 01/12/2018  1.  Severely dilated LV with diffuse hypokinesis, EF 14%. 2.  Mildly dilated RV with mildly decreased systolic function. 3. No myocardial LGE, so no definitive evidence for prior MI, infiltrative disease, or myocarditis.  Review of systems complete and found to be negative unless listed in HPI.   Past Medical History:  Diagnosis Date  . Anemia   . BP (high blood pressure)   . History of blood transfusion 08/2017   "related to low blood count"   . Multiple sclerosis ([place])   . Pneumonia 12/2017    Current Outpatient Medications  Medication Sig Dispense Refill  . carvedilol (COREG) 6.25 MG tablet Take 1 tablet (6.25 mg total) by mouth 2 (two) times daily with a meal. 60 tablet 6  . ferrous sulfate 325 (65 FE) MG tablet Take 1 tablet (325 mg total) by mouth daily with breakfast. 30 tablet 3  . furosemide (LASIX) 40 MG tablet Take 1 tablet (40 mg total) by mouth daily. 30 tablet 6  . potassium chloride SA (K-DUR,KLOR-CON) 20 MEQ tablet Take 1 tablet (20  mEq total) by mouth daily. 30 tablet 6  . sacubitril-valsartan (ENTRESTO) 49-51 MG Take 1 tablet by mouth 2 (two) times daily. 60 tablet 6  . spironolactone (ALDACTONE) 25 MG tablet Take 1 tablet (25 mg total) by mouth daily. 30 tablet 6  . Teriflunomide (AUBAGIO) 14 MG TABS Take 14 mg by mouth daily.     No current facility-administered medications for this encounter.     No Known Allergies    Social History   Socioeconomic History  . Marital status: Married    Spouse name: Not on file  . Number of children: Not on file  . Years of education: Not on file  . Highest education level: Not on file  Occupational History  . Not on file  Social Needs  . Financial resource strain: Not on file  . Food insecurity:    Worry: Not on file    Inability: Not on file  . Transportation needs:    Medical: Not on file    Non-medical: Not on file  Tobacco Use  . Smoking status: Never Smoker  . Smokeless tobacco: Never Used  Substance and Sexual Activity  . Alcohol use: Never    Frequency: Never  . Drug use: Never  . Sexual activity: Yes  Lifestyle  . Physical activity:    Days per week: Not on file    Minutes per session: Not on file  . Stress: Not on file  Relationships  . Social connections:  Talks on phone: Not on file    Gets together: Not on file    Attends religious service: Not on file    Active member of club or organization: Not on file    Attends meetings of clubs or organizations: Not on file    Relationship status: Not on file  . Intimate partner violence:    Fear of current or ex partner: Not on file    Emotionally abused: Not on file    Physically abused: Not on file    Forced sexual activity: Not on file  Other Topics Concern  . Not on file  Social History Narrative  . Not on file      Family History  Problem Relation Age of Onset  . Diabetes Mother   . Hypertension Mother   . Fibroids Sister     Vitals:   03/13/18 1037  BP: 92/64  Pulse: 88    SpO2: 97%  Weight: 298 lb 9.6 oz (135.4 kg)   Wt Readings from Last 3 Encounters:  03/13/18 298 lb 9.6 oz (135.4 kg)  02/09/18 295 lb (133.8 kg)  01/20/18 295 lb 6 oz (134 kg)   PHYSICAL EXAM: General:  Well appearing. No resp difficulty HEENT: normal Neck: supple. no JVD. Carotids 2+ bilat; no bruits. No lymphadenopathy or thryomegaly appreciated. Cor: PMI nondisplaced. Regular rate & rhythm. No rubs, gallops or murmurs. Lungs: clear Abdomen: obese, soft, nontender, nondistended. No hepatosplenomegaly. No bruits or masses. Good bowel sounds. Extremities: no cyanosis, clubbing, rash, edema Neuro: alert & orientedx3, cranial nerves grossly intact. moves all 4 extremities w/o difficulty. Affect pleasant  EKG: NSR 81 bpm   ASSESSMENT & PLAN: 1. Chronic systolic CHF: Echo 9/47 with EF 10-15%, moderate MR. Due to NICM. 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 Mother developed CHF in her 10s but no other family members have CHF that she knows of (has several healthy siblings). She does not drink ETOH or use drugs. No children, has not been pregnant so not peri-partum CMP. TSH normal. - LHC 01/12/18 with no CAD, RHC 01/12/18 with normal filling pressures and preserved CO.  - Cardiac MRI 01/12/18 did not show LGE, EF 14%.  -Discussed that she needs to prevent pregnancy for now. Instructed to use condoms to prevent pregnancy. -NYHA II-III. Volume status stable.  Continue lasix 40 mg daily.   - Continue Entresto 49/51 bid. - Continue spironolactone 25 daily.   - Continue coreg 6.25 mg BID.  - Add corlanor 2.5 mg twice a day   - No digoxin with preserved CO per Dr Aundra Dubin.  2. History of uterine fibroids with bleeding/anemia:  - Denies bleeding.   3. Multiple sclerosis: Quiescent currently. Aubagio, her MS medication, does not appear to have CHF as a side effect. Only cardiac side effect appears to be HTN. - Followed by Dr Blanch Media  4. Snores Set up for sleep study  5. Obesity Body mass  index is 48.2 kg/m.   6. Palpitations  Occurs weekly. EKG- NSR today.  If increased in frequency will place monitor.    Follow up 2-3 weeks. BMET   Darrick Grinder, NP 03/13/18

## 2018-03-13 ENCOUNTER — Encounter (HOSPITAL_COMMUNITY): Payer: Self-pay

## 2018-03-13 ENCOUNTER — Ambulatory Visit (HOSPITAL_COMMUNITY)
Admission: RE | Admit: 2018-03-13 | Discharge: 2018-03-13 | Disposition: A | Discharge status: Home / Self Care | Payer: PRIVATE HEALTH INSURANCE | Source: Ambulatory Visit | Attending: Cardiology | Admitting: Cardiology

## 2018-03-13 VITALS — BP 92/64 | HR 88 | Wt 298.6 lb

## 2018-03-13 DIAGNOSIS — I5022 Chronic systolic (congestive) heart failure: Secondary | ICD-10-CM | POA: Diagnosis not present

## 2018-03-13 DIAGNOSIS — R002 Palpitations: Secondary | ICD-10-CM | POA: Diagnosis not present

## 2018-03-13 DIAGNOSIS — Z842 Family history of other diseases of the genitourinary system: Secondary | ICD-10-CM | POA: Diagnosis not present

## 2018-03-13 DIAGNOSIS — I340 Nonrheumatic mitral (valve) insufficiency: Secondary | ICD-10-CM | POA: Diagnosis not present

## 2018-03-13 DIAGNOSIS — I110 Hypertensive heart disease with heart failure: Secondary | ICD-10-CM | POA: Diagnosis not present

## 2018-03-13 DIAGNOSIS — D259 Leiomyoma of uterus, unspecified: Secondary | ICD-10-CM | POA: Diagnosis not present

## 2018-03-13 DIAGNOSIS — Z6841 Body Mass Index (BMI) 40.0 and over, adult: Secondary | ICD-10-CM | POA: Diagnosis not present

## 2018-03-13 DIAGNOSIS — Z833 Family history of diabetes mellitus: Secondary | ICD-10-CM | POA: Insufficient documentation

## 2018-03-13 DIAGNOSIS — Z79899 Other long term (current) drug therapy: Secondary | ICD-10-CM | POA: Insufficient documentation

## 2018-03-13 DIAGNOSIS — Z8249 Family history of ischemic heart disease and other diseases of the circulatory system: Secondary | ICD-10-CM | POA: Diagnosis not present

## 2018-03-13 DIAGNOSIS — I5020 Unspecified systolic (congestive) heart failure: Secondary | ICD-10-CM | POA: Diagnosis present

## 2018-03-13 DIAGNOSIS — R0683 Snoring: Secondary | ICD-10-CM | POA: Diagnosis not present

## 2018-03-13 DIAGNOSIS — D649 Anemia, unspecified: Secondary | ICD-10-CM | POA: Diagnosis not present

## 2018-03-13 DIAGNOSIS — G35 Multiple sclerosis: Secondary | ICD-10-CM | POA: Diagnosis not present

## 2018-03-13 DIAGNOSIS — E669 Obesity, unspecified: Secondary | ICD-10-CM | POA: Diagnosis not present

## 2018-03-13 DIAGNOSIS — I428 Other cardiomyopathies: Secondary | ICD-10-CM | POA: Insufficient documentation

## 2018-03-13 MED ORDER — IVABRADINE HCL 5 MG PO TABS: 2.5000 mg | ORAL_TABLET | Freq: Two times a day (BID) | ORAL | 3 refills | Status: DC

## 2018-03-13 NOTE — Patient Instructions (Signed)
START taking Corlanor 2.5 mg (0.5 Tablet) Twice Daily  Referral placed for Sleep Study, their office will contact you to schedule test once insurance approves.  Follow up in 2-3 weeks.

## 2018-03-16 ENCOUNTER — Encounter (HOSPITAL_COMMUNITY): Payer: PRIVATE HEALTH INSURANCE

## 2018-03-18 ENCOUNTER — Encounter (HOSPITAL_COMMUNITY): Payer: PRIVATE HEALTH INSURANCE

## 2018-03-20 ENCOUNTER — Encounter (HOSPITAL_COMMUNITY): Payer: PRIVATE HEALTH INSURANCE

## 2018-03-23 ENCOUNTER — Encounter (HOSPITAL_COMMUNITY): Payer: PRIVATE HEALTH INSURANCE

## 2018-03-25 ENCOUNTER — Encounter (HOSPITAL_COMMUNITY): Payer: PRIVATE HEALTH INSURANCE

## 2018-03-27 ENCOUNTER — Encounter (HOSPITAL_COMMUNITY): Payer: PRIVATE HEALTH INSURANCE

## 2018-03-27 NOTE — Progress Notes (Signed)
Advanced Heart Failure Clinic Note   Referring Physician: PCP: [name], PA PCP-Cardiologist: Dr Aundra Dubin  HPI: Amanda [name] is a 37 y.o. female with a history of multiple sclerosis, anemia due to uterine fibroids, and newly diagnosed systolic HF (11/812).  Admitted to [facility] 5/31-01/13/18  with volume overload and found to have acute systolic heart failure. Echo showed EF 10-15%. HF team consulted. R/LHC completed, which showed no CAD and preserved CO. Cardiac MRI showed no LGE, EF 14%. She was diuresed with IV lasix and transitioned to lasix 40 mg daily. HF meds were optimized. DC weight: 299 lbs.  Today she returns for HF follow up. Last visit low dose corlanor was added but she could not afford it. Complaining of fatigue and dizziness. Complaining of stress. Says it feels like a MS flare. She has follow up with MS doctor next week. Denies PND/Orthopnea. No falls at home.  Appetite ok. No fever or chills. Weight at home stable. Rarely has palpitations. Taking all medications but did not take her medications today. 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 Lives with her husband.    CMRI 01/12/2018  1.  Severely dilated LV with diffuse hypokinesis, EF 14%. 2.  Mildly dilated RV with mildly decreased systolic function. 3. No myocardial LGE, so no definitive evidence for prior MI, infiltrative disease, or myocarditis.  Review of systems complete and found to be negative unless listed in HPI.   Past Medical History:  Diagnosis Date  . Anemia   . BP (high blood pressure)   . History of blood transfusion 08/2017   "related to low blood count"   . Multiple sclerosis ([place])   . Pneumonia 12/2017    Current Outpatient Medications  Medication Sig Dispense Refill  . carvedilol (COREG) 6.25 MG tablet Take 1 tablet (6.25 mg total) by mouth 2 (two) times daily with a meal. 60 tablet 6  . ferrous sulfate 325 (65 FE) MG tablet Take 1 tablet (325 mg total) by mouth daily with breakfast. 30  tablet 3  . furosemide (LASIX) 40 MG tablet Take 1 tablet (40 mg total) by mouth daily. 30 tablet 6  . potassium chloride SA (K-DUR,KLOR-CON) 20 MEQ tablet Take 1 tablet (20 mEq total) by mouth daily. 30 tablet 6  . sacubitril-valsartan (ENTRESTO) 49-51 MG Take 1 tablet by mouth 2 (two) times daily. 60 tablet 6  . spironolactone (ALDACTONE) 25 MG tablet Take 1 tablet (25 mg total) by mouth daily. 30 tablet 6  . ivabradine (CORLANOR) 5 MG TABS tablet Take 0.5 tablets (2.5 mg total) by mouth 2 (two) times daily with a meal. (Patient not taking: Reported on 03/30/2018) 30 tablet 3  . Teriflunomide (AUBAGIO) 14 MG TABS Take 14 mg by mouth daily.     No current facility-administered medications for this encounter.     No Known Allergies    Social History   Socioeconomic History  . Marital status: Married    Spouse name: Not on file  . Number of children: Not on file  . Years of education: Not on file  . Highest education level: Not on file  Occupational History  . Not on file  Social Needs  . Financial resource strain: Not on file  . Food insecurity:    Worry: Not on file    Inability: Not on file  . Transportation needs:    Medical: Not on file    Non-medical: Not on file  Tobacco Use  . Smoking status: Never Smoker  .  Smokeless tobacco: Never Used  Substance and Sexual Activity  . Alcohol use: Never    Frequency: Never  . Drug use: Never  . Sexual activity: Yes  Lifestyle  . Physical activity:    Days per week: Not on file    Minutes per session: Not on file  . Stress: Not on file  Relationships  . Social connections:    Talks on phone: Not on file    Gets together: Not on file    Attends religious service: Not on file    Active member of club or organization: Not on file    Attends meetings of clubs or organizations: Not on file    Relationship status: Not on file  . Intimate partner violence:    Fear of current or ex partner: Not on file    Emotionally abused: Not  on file    Physically abused: Not on file    Forced sexual activity: Not on file  Other Topics Concern  . Not on file  Social History Narrative  . Not on file      Family History  Problem Relation Age of Onset  . Diabetes Mother   . Hypertension Mother   . Fibroids Sister     Vitals:   03/30/18 0938  BP: (!) 126/98  Pulse: 87  SpO2: 98%  Weight: 135.5 kg (298 lb 12.8 oz)   Wt Readings from Last 3 Encounters:  03/30/18 135.5 kg (298 lb 12.8 oz)  03/13/18 135.4 kg (298 lb 9.6 oz)  02/09/18 133.8 kg (295 lb)   PHYSICAL EXAM: General:  No resp difficulty. Walked in the clinic.  HEENT: normal Neck: supple. no JVD. Carotids 2+ bilat; no bruits. No lymphadenopathy or thryomegaly appreciated. Cor: PMI nondisplaced. Regular rate & rhythm. No rubs, gallops or murmurs. Lungs: clear Abdomen: soft, nontender, nondistended. No hepatosplenomegaly. No bruits or masses. Good bowel sounds. Extremities: no cyanosis, clubbing, rash, edema Neuro: alert & orientedx3, cranial nerves grossly intact. moves all 4 extremities w/o difficulty. Affect pleasant   EKG: NSR 100 bpm    ASSESSMENT & PLAN: 1. Chronic systolic CHF: Echo 9/79 with EF 10-15%, moderate MR. Due to NICM. 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 Mother developed CHF in her 55s but no other family members have CHF that she knows of (has several healthy siblings). She does not drink ETOH or use drugs. No children, has not been pregnant so not peri-partum CMP. TSH normal. - LHC 01/12/18 with no CAD, RHC 01/12/18 with normal filling pressures and preserved CO.  - Cardiac MRI 01/12/18 did not show LGE, EF 14%.  -She understands she needs to prevent pregnancy.  -I suspect her symptoms are related to MS flare with the cmount of stress she is under and she verified this was what was she felt with previous flares.   . -NYHA II-III. No medications changes today because she has not taken any medications. I have asked her to take all  medications and to stay out of work until she has follow up with Dr Aundra Dubin.  - Volume status stable.  Continue lasix 40 mg daily.   - Continue Entresto 49/51 bid. - Continue spironolactone 25 daily.   - Continue coreg 6.25 mg BID.  - Unable to afford corlanro.  - No digoxin with preserved CO per Dr Aundra Dubin.  2. History of uterine fibroids with bleeding/anemia:  - Denies bleeding.   3. Multiple sclerosis: Quiescent currently. Aubagio, her MS medication, does not appear to have CHF as  a side effect. Only cardiac side effect appears to be HTN. - Followed by Dr Blanch Media - She has follow up next week.   4. Snores Set up for sleep study  5. Obesity Body mass index is 48.23 kg/m.   6. Palpitations  Rarely   Today she was referred to HF SW for coping strategies. I have provided a note for work.   Greater than 50% of the 25 minute visit was spent in counseling/coordination of care regarding disease state education, salt/fluid restriction, sliding scale diuretics, and medication compliance.   Follow up in 2 weeks with Dr Aundra Dubin and an ECHO. Marland Kitchen     Darrick Grinder, NP 03/30/18

## 2018-03-30 ENCOUNTER — Encounter (HOSPITAL_COMMUNITY): Payer: PRIVATE HEALTH INSURANCE

## 2018-03-30 ENCOUNTER — Encounter (HOSPITAL_COMMUNITY): Payer: Self-pay

## 2018-03-30 ENCOUNTER — Ambulatory Visit (HOSPITAL_COMMUNITY)
Admission: RE | Admit: 2018-03-30 | Discharge: 2018-03-30 | Disposition: A | Discharge status: Home / Self Care | Payer: PRIVATE HEALTH INSURANCE | Source: Ambulatory Visit | Attending: Internal Medicine | Admitting: Internal Medicine

## 2018-03-30 VITALS — BP 126/98 | HR 87 | Wt 298.8 lb

## 2018-03-30 DIAGNOSIS — Z842 Family history of other diseases of the genitourinary system: Secondary | ICD-10-CM | POA: Insufficient documentation

## 2018-03-30 DIAGNOSIS — Z8249 Family history of ischemic heart disease and other diseases of the circulatory system: Secondary | ICD-10-CM | POA: Insufficient documentation

## 2018-03-30 DIAGNOSIS — G35 Multiple sclerosis: Secondary | ICD-10-CM | POA: Diagnosis present

## 2018-03-30 DIAGNOSIS — Z79899 Other long term (current) drug therapy: Secondary | ICD-10-CM | POA: Insufficient documentation

## 2018-03-30 DIAGNOSIS — R0683 Snoring: Secondary | ICD-10-CM | POA: Insufficient documentation

## 2018-03-30 DIAGNOSIS — D259 Leiomyoma of uterus, unspecified: Secondary | ICD-10-CM | POA: Insufficient documentation

## 2018-03-30 DIAGNOSIS — I428 Other cardiomyopathies: Secondary | ICD-10-CM | POA: Diagnosis not present

## 2018-03-30 DIAGNOSIS — R002 Palpitations: Secondary | ICD-10-CM | POA: Insufficient documentation

## 2018-03-30 DIAGNOSIS — D649 Anemia, unspecified: Secondary | ICD-10-CM | POA: Diagnosis not present

## 2018-03-30 DIAGNOSIS — Z6841 Body Mass Index (BMI) 40.0 and over, adult: Secondary | ICD-10-CM | POA: Diagnosis not present

## 2018-03-30 DIAGNOSIS — I5022 Chronic systolic (congestive) heart failure: Secondary | ICD-10-CM | POA: Insufficient documentation

## 2018-03-30 DIAGNOSIS — E669 Obesity, unspecified: Secondary | ICD-10-CM | POA: Insufficient documentation

## 2018-03-30 DIAGNOSIS — Z833 Family history of diabetes mellitus: Secondary | ICD-10-CM | POA: Diagnosis not present

## 2018-03-30 NOTE — Progress Notes (Signed)
Heart Failure Clinic Social Work Assessment  Amanda [name]  presents today in association with an [place] [facility] Appointment.  Patient seen today by CSW for follow up/assistance on Financial difficulties Health problems and/or  Food   Social Determinants impacting successful heart failure regimen:  Housing: patient lives with Husband Food: Do you have enough food? Yes Although husband reports that they borrowed monies from his family to get food as they were low.  Do you know and understand healthy eating and how that affects your heart failure diagnosis? Yes  Do you follow a low salt diet?  Trying to but limited due to lack of food at times. Utilities: Do you have gas and/or electricity on in your home? Yes  Income: What is your source of income? Part time employment/ husband employed Insurance: Engineer, manufacturing systems) Transportation: Do you have transportation to your medical appointments?Yes  If yes, how? They have their own car although due to limited finances due to inability to work because of health issues are struggling to make payments.    Daily Health Needs: Do you have a scale and weigh each day?  Yes  Do you have your medications? Yes  Are you able to adhere to your medication regimen? Yes   Do you ever take medications differently than prescribed? No  Do you know the zones of Heart Failure?  Did not discuss Do you know how to contact the HF Clinic appropriately with worsening symptoms or weight increases?  Yes    Do you have an Advanced Directive?  Did not discuss at the time of visit. If not, would you like to complete?   Do you have any identified obstacles / challenges for adherence to current treatment plan?Yes    Patient appears to be overwhelmed and "stressed" due to health issues, finances and lack of food.   CSW assisted patient with Amanda [name], Problem-solving teaching/coping strategies and Supportive Counseling with the goal to Increase healthy  adjustment to current life circumstances and and access to community resources to reduce stress levels as well as referred to Amanda [name] Program at her employer for ongoing supportive counseling.   Heart Failure Clinic Social Worker will continue to coordinate and monitor patient's treatment plan as needed.  CSW continues to be available for identified needs. Amanda [name], Amanda [name], Amanda [name]

## 2018-03-30 NOTE — Addendum Note (Signed)
Encounter addended by: Louann Liv, LCSW on: 03/30/2018 2:13 PM  Actions taken: Sign clinical note

## 2018-04-01 ENCOUNTER — Encounter (HOSPITAL_COMMUNITY): Payer: PRIVATE HEALTH INSURANCE

## 2018-04-03 ENCOUNTER — Encounter (HOSPITAL_COMMUNITY): Payer: PRIVATE HEALTH INSURANCE

## 2018-04-06 ENCOUNTER — Encounter (HOSPITAL_COMMUNITY): Payer: PRIVATE HEALTH INSURANCE

## 2018-04-08 ENCOUNTER — Encounter (HOSPITAL_COMMUNITY): Payer: PRIVATE HEALTH INSURANCE

## 2018-04-10 ENCOUNTER — Encounter (HOSPITAL_COMMUNITY): Payer: PRIVATE HEALTH INSURANCE

## 2018-04-14 ENCOUNTER — Ambulatory Visit (HOSPITAL_COMMUNITY)
Admission: RE | Admit: 2018-04-14 | Discharge: 2018-04-14 | Disposition: A | Discharge status: Home / Self Care | Payer: PRIVATE HEALTH INSURANCE | Source: Ambulatory Visit | Attending: Urgent Care | Admitting: Urgent Care

## 2018-04-14 ENCOUNTER — Ambulatory Visit (HOSPITAL_BASED_OUTPATIENT_CLINIC_OR_DEPARTMENT_OTHER)
Admission: RE | Admit: 2018-04-14 | Discharge: 2018-04-14 | Disposition: A | Discharge status: Home / Self Care | Payer: PRIVATE HEALTH INSURANCE | Source: Ambulatory Visit | Attending: Cardiology | Admitting: Cardiology

## 2018-04-14 ENCOUNTER — Other Ambulatory Visit (HOSPITAL_COMMUNITY): Payer: Self-pay | Admitting: *Deleted

## 2018-04-14 VITALS — BP 130/84 | HR 78 | Wt 298.4 lb

## 2018-04-14 DIAGNOSIS — I5022 Chronic systolic (congestive) heart failure: Secondary | ICD-10-CM | POA: Diagnosis not present

## 2018-04-14 DIAGNOSIS — Z6841 Body Mass Index (BMI) 40.0 and over, adult: Secondary | ICD-10-CM | POA: Diagnosis not present

## 2018-04-14 DIAGNOSIS — E669 Obesity, unspecified: Secondary | ICD-10-CM | POA: Insufficient documentation

## 2018-04-14 DIAGNOSIS — R0683 Snoring: Secondary | ICD-10-CM | POA: Diagnosis not present

## 2018-04-14 DIAGNOSIS — I428 Other cardiomyopathies: Secondary | ICD-10-CM | POA: Diagnosis not present

## 2018-04-14 DIAGNOSIS — Z79899 Other long term (current) drug therapy: Secondary | ICD-10-CM | POA: Insufficient documentation

## 2018-04-14 DIAGNOSIS — G35 Multiple sclerosis: Secondary | ICD-10-CM | POA: Diagnosis not present

## 2018-04-14 DIAGNOSIS — D259 Leiomyoma of uterus, unspecified: Secondary | ICD-10-CM | POA: Insufficient documentation

## 2018-04-14 DIAGNOSIS — D649 Anemia, unspecified: Secondary | ICD-10-CM | POA: Diagnosis not present

## 2018-04-14 LAB — BASIC METABOLIC PANEL
Anion gap: 5 (ref 5–15)
BUN: 13 mg/dL (ref 6–20)
CO2: 26 mmol/L (ref 22–32)
Calcium: 9.1 mg/dL (ref 8.9–10.3)
Chloride: 111 mmol/L (ref 98–111)
Creatinine, Ser: 0.69 mg/dL (ref 0.44–1.00)
GFR calc Af Amer: >60 mL/min (ref >60)
GFR calc non Af Amer: >60 mL/min (ref >60)
Glucose, Bld: 91 mg/dL (ref 70–99)
Potassium: 3.9 mmol/L (ref 3.5–5.1)
Sodium: 142 mmol/L (ref 135–145)

## 2018-04-14 LAB — TSH: TSH: 1.531 u[IU]/mL (ref 0.350–4.500)

## 2018-04-14 MED ORDER — CARVEDILOL 12.5 MG PO TABS: 12.5000 mg | ORAL_TABLET | Freq: Two times a day (BID) | ORAL | 6 refills | Status: DC

## 2018-04-14 NOTE — Patient Instructions (Signed)
Increase Carvedilol to 12.5 mg Twice daily   Labs done today  Please follow up with Doroteo Bradford, our heart failure pharmacist in 3 weeks  Your physician recommends that you schedule a follow-up appointment in: 2 months

## 2018-04-14 NOTE — Progress Notes (Signed)
  Echocardiogram 2D Echocardiogram has been performed.  Amanda [name] 04/14/2018, 10:47 AM

## 2018-04-14 NOTE — Progress Notes (Signed)
Advanced Heart Failure Clinic Note   Referring Physician: PCP: [name], PA PCP-Cardiologist: Dr Aundra Dubin  HPI: Amanda [name] is a 37 y.o. female with a history of multiple sclerosis, anemia due to uterine fibroids, and systolic HF (dx 0/1749).  Admitted to [facility] 5/31-01/13/18  with volume overload and found to have acute systolic heart failure. Echo showed EF 10-15%. HF team consulted. R/LHC completed, which showed no CAD and preserved CO. Cardiac MRI showed no LGE, EF 14%. She was diuresed with IV lasix and transitioned to lasix 40 mg daily. HF meds were optimized. DC weight: 299 lbs.  Today she returns for HF follow up.  She has been doing better overall.  Weight unchanged.  No dyspnea walking on flat ground.  She does get short of breath walking up a hill.  No orthopnea/PND.  No chest pain.  No lightheadedness.  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   ECG (8/19, personally reviewed): NSR, nonspecific T wave flattening inferiorly, narrow QRS  Labs (6/19): K 4.3, creatinine 0.88  PMH:  1. Multiple sclerosis 2. Uterine fibroids 3. Anemia: Likely related to fibroids.  4. Chronic systolic CHF: Diagnosed in 5/19.  Nonischemic cardiomyopathy.  - Echo (6/19) with EF 10-15%.  - RHC/LHC (6/19): mean RA 5, PA 30/13 mean 22, mean PCWP 9, CI 2.63.  No angiographic CAD.  - Cardiac MRI (6/19): EF 14% with severely dilated LV, mildly dilated RV with mildly decreased systolic function, no myocardial LGE.  - Echo (8/19): EF 30%, diffuse hypokinesis, mildly dilated RV with normal systolic function, PASP 40 mmHg.   Review of systems complete and found to be negative unless listed in HPI.    Current Outpatient Medications  Medication Sig Dispense Refill  . carvedilol (COREG) 12.5 MG tablet Take 1 tablet (12.5 mg total) by mouth 2 (two) times daily with a meal. 60 tablet 6  . ferrous sulfate 325 (65 FE) MG tablet Take 1 tablet (325 mg total) by mouth daily with breakfast. 30  tablet 3  . furosemide (LASIX) 40 MG tablet Take 1 tablet (40 mg total) by mouth daily. 30 tablet 6  . potassium chloride SA (K-DUR,KLOR-CON) 20 MEQ tablet Take 1 tablet (20 mEq total) by mouth daily. 30 tablet 6  . sacubitril-valsartan (ENTRESTO) 49-51 MG Take 1 tablet by mouth 2 (two) times daily. 60 tablet 6  . spironolactone (ALDACTONE) 25 MG tablet Take 1 tablet (25 mg total) by mouth daily. 30 tablet 6  . Teriflunomide (AUBAGIO) 14 MG TABS Take 14 mg by mouth daily.    . ivabradine (CORLANOR) 5 MG TABS tablet Take 0.5 tablets (2.5 mg total) by mouth 2 (two) times daily with a meal. (Patient not taking: Reported on 03/30/2018) 30 tablet 3   No current facility-administered medications for this encounter.     No Known Allergies    Social History   Socioeconomic History  . Marital status: Married    Spouse name: Not on file  . Number of children: Not on file  . Years of education: Not on file  . Highest education level: Not on file  Occupational History  . Not on file  Social Needs  . Financial resource strain: Not on file  . Food insecurity:    Worry: Not on file    Inability: Not on file  . Transportation needs:    Medical: Not on file    Non-medical: Not on file  Tobacco Use  . Smoking status: Never Smoker  .  Smokeless tobacco: Never Used  Substance and Sexual Activity  . Alcohol use: Never    Frequency: Never  . Drug use: Never  . Sexual activity: Yes  Lifestyle  . Physical activity:    Days per week: Not on file    Minutes per session: Not on file  . Stress: Not on file  Relationships  . Social connections:    Talks on phone: Not on file    Gets together: Not on file    Attends religious service: Not on file    Active member of club or organization: Not on file    Attends meetings of clubs or organizations: Not on file    Relationship status: Not on file  . Intimate partner violence:    Fear of current or ex partner: Not on file    Emotionally abused: Not  on file    Physically abused: Not on file    Forced sexual activity: Not on file  Other Topics Concern  . Not on file  Social History Narrative  . Not on file      Family History  Problem Relation Age of Onset  . Diabetes Mother   . Hypertension Mother   . Fibroids Sister     Vitals:   04/14/18 1109  BP: 130/84  Pulse: 78  SpO2: 98%  Weight: 135.4 kg (298 lb 6.4 oz)   Wt Readings from Last 3 Encounters:  04/14/18 135.4 kg (298 lb 6.4 oz)  03/30/18 135.5 kg (298 lb 12.8 oz)  03/13/18 135.4 kg (298 lb 9.6 oz)   PHYSICAL EXAM: General: NAD Neck: No JVD, no thyromegaly or thyroid nodule.  Lungs: Clear to auscultation bilaterally with normal respiratory effort. CV: Nondisplaced PMI.  Heart regular S1/S2, no S3/S4, no murmur.  No peripheral edema.  No carotid bruit.  Normal pedal pulses.  Abdomen: Soft, nontender, no hepatosplenomegaly, no distention.  Skin: Intact without lesions or rashes.  Neurologic: Alert and oriented x 3.  Psych: Normal affect. Extremities: No clubbing or cyanosis.  HEENT: Normal.   ASSESSMENT & PLAN: 1. Chronic systolic CHF: Echo 1/91 with EF 10-15%, moderate MR. Nonischemic cardiomyopathy with no coronary disease on 6/19 cath. 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 Mother developed CHF in her 7s but no other family members have CHF that she knows of (has several healthy siblings). She does not drink ETOH or use drugs. No children, has not been pregnant so not peri-partum CMP. TSH normal.  RHC 01/12/18 with normal filling pressures and preserved CO.  Cardiac MRI 01/12/18 did not show LGE, EF 14%. Repeat echo was done today and reviewed, EF up to 30%.  She is not volume overloaded on exam, NYHA class II symptoms.  - Volume status stable.  Continue lasix 40 mg daily.  BMET today.  - Continue Entresto 49/51 bid. - Continue spironolactone 25 daily.   - Increase Coreg to 12.5 mg bid.  - EF has improved some on today's echo.  Repeat echo in 3 more months.   If EF > 35%, can avoid ICD.  Narrow QRS so not CRT candidate.   2. History of uterine fibroids with bleeding/anemia: Stable currently.  3. Multiple sclerosis: Quiescent currently. Aubagio, her MS medication, does not appear to have CHF as a side effect. Only cardiac side effect appears to be HTN. 4. Snores: Set up for sleep study this Sunday.  5. Obesity: We discussed weight loss with diet and exercise.  Body mass index is 48.16 kg/m.   Followup  in 3 wks with CHF pharmacist for medication titration.  See me in 2 months.   Loralie Champagne, MD 04/14/18

## 2018-04-15 ENCOUNTER — Encounter (HOSPITAL_COMMUNITY): Payer: PRIVATE HEALTH INSURANCE

## 2018-04-17 ENCOUNTER — Encounter (HOSPITAL_COMMUNITY): Payer: PRIVATE HEALTH INSURANCE

## 2018-04-19 ENCOUNTER — Ambulatory Visit (HOSPITAL_BASED_OUTPATIENT_CLINIC_OR_DEPARTMENT_OTHER): Payer: PRIVATE HEALTH INSURANCE | Attending: Adult Health | Admitting: Cardiology

## 2018-04-19 VITALS — Ht 66.0 in | Wt 298.0 lb

## 2018-04-19 DIAGNOSIS — R0683 Snoring: Secondary | ICD-10-CM | POA: Diagnosis present

## 2018-04-19 DIAGNOSIS — Z6841 Body Mass Index (BMI) 40.0 and over, adult: Secondary | ICD-10-CM | POA: Diagnosis not present

## 2018-04-19 DIAGNOSIS — E669 Obesity, unspecified: Secondary | ICD-10-CM | POA: Diagnosis not present

## 2018-04-19 DIAGNOSIS — R5383 Other fatigue: Secondary | ICD-10-CM | POA: Insufficient documentation

## 2018-04-19 DIAGNOSIS — R0681 Apnea, not elsewhere classified: Secondary | ICD-10-CM | POA: Insufficient documentation

## 2018-04-20 ENCOUNTER — Encounter (HOSPITAL_COMMUNITY): Payer: PRIVATE HEALTH INSURANCE

## 2018-04-20 NOTE — Procedures (Signed)
   Patient Name: Amanda [name], Amanda [name] Study Date:07/29/2017 04/19/2018 Gender: Female D.O.B: 10-16-1980 Age (years): 25 Referring Provider: Darrick Grinder NP Height (inches): 66 Interpreting Physician: Fransico Him MD, ABSM Weight (lbs): 298 RPSGT: Baxter Flattery BMI: 48 MRN: [id number] Neck Size: 16.00  CLINICAL INFORMATION Sleep Study Type: NPSG  Indication for sleep study: Fatigue, Obesity, Snoring, Witnesses Apnea / Gasping During Sleep  Epworth Sleepiness Score: 3  SLEEP STUDY TECHNIQUE As per the AASM Manual for the Scoring of Sleep and Associated Events v2.3 (April 2016) with a hypopnea requiring 4% desaturations.  The channels recorded and monitored were frontal, central and occipital EEG, electrooculogram (EOG), submentalis EMG (chin), nasal and oral airflow, thoracic and abdominal wall motion, anterior tibialis EMG, snore microphone, electrocardiogram, and pulse oximetry.  MEDICATIONS Medications self-administered by patient taken the night of the study : N/A  SLEEP ARCHITECTURE The study was initiated at 10:55:48 PM and ended at 5:09:18 AM.  Sleep onset time was 17.2 minutes and the sleep efficiency was 86.4%%. The total sleep time was 322.8 minutes.  Stage REM latency was 101.0 minutes.  The patient spent 4.5%% of the night in stage N1 sleep, 83.0%% in stage N2 sleep, 0.5%% in stage N3 and 12.1% in REM.  Alpha intrusion was absent.  Supine sleep was 33.14%.  RESPIRATORY PARAMETERS The overall apnea/hypopnea index (AHI) was 1.1 per hour. There were 0 total apneas, including 0 obstructive, 0 central and 0 mixed apneas. There were 6 hypopneas and 0 RERAs.  The AHI during Stage REM sleep was 7.7 per hour.  AHI while supine was 2.8 per hour.  The mean oxygen saturation was 94.0%. The minimum SpO2 during sleep was 88.0%.  soft snoring was noted during this study.  CARDIAC DATA The 2 lead EKG demonstrated sinus rhythm. The mean heart rate was 76.6 beats per minute. Other  EKG findings include: None.  LEG MOVEMENT DATA The total PLMS were 0 with a resulting PLMS index of 0.0. Associated arousal with leg movement index was 0.0 .  IMPRESSIONS - No significant obstructive sleep apnea occurred during this study (AHI = 1.1/h). - No significant central sleep apnea occurred during this study (CAI = 0.0/h). - The patient had minimal or no oxygen desaturation during the study (Min O2 = 88.0%) - The patient snored with soft snoring volume. - No cardiac abnormalities were noted during this study. - Clinically significant periodic limb movements did not occur during sleep. No significant associated arousals.  DIAGNOSIS - Normal Study  RECOMMENDATIONS - Avoid alcohol, sedatives and other CNS depressants that may worsen sleep apnea and disrupt normal sleep architecture. - Sleep hygiene should be reviewed to assess factors that may improve sleep quality. - Weight management and regular exercise should be initiated or continued if appropriate.  [Electronically signed] 04/20/2018 06:35 PM  Fransico Him MD, ABSM Diplomate, American Board of Sleep Medicine

## 2018-04-21 ENCOUNTER — Encounter (HOSPITAL_COMMUNITY): Payer: Self-pay

## 2018-04-21 ENCOUNTER — Encounter (HOSPITAL_COMMUNITY)
Admission: RE | Admit: 2018-04-21 | Discharge: 2018-04-21 | Disposition: A | Discharge status: Home / Self Care | Payer: PRIVATE HEALTH INSURANCE | Source: Ambulatory Visit | Attending: Cardiology | Admitting: Cardiology

## 2018-04-21 VITALS — BP 98/60 | HR 95 | Ht 67.0 in | Wt 300.0 lb

## 2018-04-21 DIAGNOSIS — G35 Multiple sclerosis: Secondary | ICD-10-CM | POA: Diagnosis not present

## 2018-04-21 DIAGNOSIS — I110 Hypertensive heart disease with heart failure: Secondary | ICD-10-CM | POA: Diagnosis not present

## 2018-04-21 DIAGNOSIS — D649 Anemia, unspecified: Secondary | ICD-10-CM | POA: Diagnosis not present

## 2018-04-21 DIAGNOSIS — Z79899 Other long term (current) drug therapy: Secondary | ICD-10-CM | POA: Diagnosis not present

## 2018-04-21 DIAGNOSIS — I5022 Chronic systolic (congestive) heart failure: Secondary | ICD-10-CM | POA: Insufficient documentation

## 2018-04-21 HISTORY — DX: Heart failure, unspecified: I50.9

## 2018-04-21 NOTE — Progress Notes (Addendum)
Cardiac Rehab Medication Review by a Registered Nurse   Does the patient  feel that his/her medications are working for him/her?  yes  Has the patient been experiencing any side effects to the medications prescribed?  no  Does the patient measure his/her own blood pressure or blood glucose at home?  no   Does the patient have any problems obtaining medications due to transportation or finances?   yes  Understanding of regimen: excellent Understanding of indications: excellent Potential of compliance: excellent    RN comments: pt uanble to continue corlanor due to cost. Dr. Aundra Dubin aware.  Pt c/o occasional dizziness and daytime drowsiness. Pt naps daily.  Sleep study results pending. Otherwise pt has no concerns or issues with current medicine regimen.     Margerite Impastato Hamilton Nini Cavan 04/21/2018 9:03 AM

## 2018-04-21 NOTE — Progress Notes (Signed)
Amanda [name] 37 y.o. female DOB: [birth year] MRN: [id number]      Nutrition Note  1. Heart failure, chronic systolic (HCC)    Past Medical History:  Diagnosis Date  . Anemia   . BP (high blood pressure)   . History of blood transfusion 08/2017   "related to low blood count"   . Multiple sclerosis ([place])   . Pneumonia 12/2017   Meds reviewed.    Current Outpatient Medications (Cardiovascular):  .  carvedilol (COREG) 12.5 MG tablet, Take 1 tablet (12.5 mg total) by mouth 2 (two) times daily with a meal. .  furosemide (LASIX) 40 MG tablet, Take 1 tablet (40 mg total) by mouth daily. .  ivabradine (CORLANOR) 5 MG TABS tablet, Take 0.5 tablets (2.5 mg total) by mouth 2 (two) times daily with a meal. (Patient not taking: Reported on 03/30/2018) .  sacubitril-valsartan (ENTRESTO) 49-51 MG, Take 1 tablet by mouth 2 (two) times daily. Marland Kitchen  spironolactone (ALDACTONE) 25 MG tablet, Take 1 tablet (25 mg total) by mouth daily.    Current Outpatient Medications (Hematological):  .  ferrous sulfate 325 (65 FE) MG tablet, Take 1 tablet (325 mg total) by mouth daily with breakfast.  Current Outpatient Medications (Other):  .  potassium chloride SA (K-DUR,KLOR-CON) 20 MEQ tablet, Take 1 tablet (20 mEq total) by mouth daily. .  Teriflunomide (AUBAGIO) 14 MG TABS, Take 14 mg by mouth daily.   HT: Ht Readings from Last 1 Encounters:  04/19/18 5\' 6"  (1.676 m)    WT: Wt Readings from Last 5 Encounters:  04/19/18 298 lb (135.2 kg)  04/14/18 298 lb 6.4 oz (135.4 kg)  03/30/18 298 lb 12.8 oz (135.5 kg)  03/13/18 298 lb 9.6 oz (135.4 kg)  02/09/18 295 lb (133.8 kg)     There is no height or weight on file to calculate BMI.   Current tobacco use? No  Labs:  Lipid Panel  No results found for: CHOL, TRIG, HDL, CHOLHDL, VLDL, LDLCALC, LDLDIRECT  No results found for: HGBA1C CBG (last 3)  No results for input(s): GLUCAP in the last 72 hours.  Nutrition Note Spoke with pt. Nutrition plan and  goals reviewed with pt. Pt is following Step 1 of the Therapeutic Lifestyle Changes diet. Pt wants to lose wt. Pt has been trying to lose wt by eating smaller portions and not salting foods at the dinner table. Wt loss tips reviewed (label reading, how to build a healthy plate, portion sizes, eating frequently across the day). Set goal with patient to start eating more frequently across the day as she currently eats about 2-3 meals, and will skip snacks . Pt with dx of CHF. Per discussion, pt does not use canned/convenience foods often. Pt rarely adds salt to food. Pt eats out infrequently. Pt expressed understanding of the information reviewed. Pt aware of nutrition education classes offered and plans on attending nutrition classes.  Nutrition Diagnosis  ? Obesity related to excessive energy intake as evidenced by a BMI 48.10  Nutrition Intervention ? Pt's individual nutrition plan and goals reviewed with pt.  Nutrition Goal(s):   ? Pt to identify and limit food sources of saturated fat, trans fat, refined carbohydrates and sodium ? Pt to identify food quantities necessary to achieve weight loss of 6-24 lbs. at graduation from cardiac rehab. Goal wt of ~200 lb desired.   Plan:  ? Pt to attend nutrition classes ? Nutrition I ? Nutrition II ? Portion Distortion  ? Will provide client-centered  nutrition education as part of interdisciplinary care ? Monitor and evaluate progress toward nutrition goal with team.   Laurina Bustle, MS, RD, LDN 04/21/2018 8:48 AM

## 2018-04-21 NOTE — Progress Notes (Signed)
Cardiac Individual Treatment Plan  Patient Details  Name: Amanda [name] MRN: [id number] Date of Birth: [birth year] Referring Provider:     CARDIAC REHAB PHASE II ORIENTATION from 04/21/2018 in [place]  Referring Provider  Loralie Champagne, MD      Initial Encounter Date:    CARDIAC REHAB PHASE II ORIENTATION from 04/21/2018 in [place]  Date  04/21/18      Visit Diagnosis: Heart failure, chronic systolic (HCC)  Patient's Home Medications on Admission:  Current Outpatient Medications:  .  carvedilol (COREG) 12.5 MG tablet, Take 1 tablet (12.5 mg total) by mouth 2 (two) times daily with a meal., Disp: 60 tablet, Rfl: 6 .  ferrous sulfate 325 (65 FE) MG tablet, Take 1 tablet (325 mg total) by mouth daily with breakfast., Disp: 30 tablet, Rfl: 3 .  furosemide (LASIX) 40 MG tablet, Take 1 tablet (40 mg total) by mouth daily., Disp: 30 tablet, Rfl: 6 .  potassium chloride SA (K-DUR,KLOR-CON) 20 MEQ tablet, Take 1 tablet (20 mEq total) by mouth daily., Disp: 30 tablet, Rfl: 6 .  sacubitril-valsartan (ENTRESTO) 49-51 MG, Take 1 tablet by mouth 2 (two) times daily., Disp: 60 tablet, Rfl: 6 .  spironolactone (ALDACTONE) 25 MG tablet, Take 1 tablet (25 mg total) by mouth daily., Disp: 30 tablet, Rfl: 6 .  Teriflunomide (AUBAGIO) 14 MG TABS, Take 14 mg by mouth daily., Disp: , Rfl:  .  ivabradine (CORLANOR) 5 MG TABS tablet, Take 0.5 tablets (2.5 mg total) by mouth 2 (two) times daily with a meal. (Patient not taking: Reported on 04/21/2018), Disp: 30 tablet, Rfl: 3  Past Medical History: Past Medical History:  Diagnosis Date  . Anemia   . BP (high blood pressure)   . CHF (congestive heart failure) ([place])   . History of blood transfusion 08/2017   "related to low blood count"   . Multiple sclerosis ([place])   . Pneumonia 12/2017    Tobacco Use: Social History   Tobacco Use  Smoking Status Never Smoker  Smokeless Tobacco  Never Used    Labs: Recent Review Scientist, physiological    Labs for ITP Cardiac and Pulmonary Rehab Latest Ref Rng & Units 01/12/2018 01/12/2018   HCO3 20.0 - 28.0 mmol/L 25.4 25.2   TCO2 22 - 32 mmol/L 27 26   O2SAT % 67.0 64.0      Capillary Blood Glucose: No results found for: GLUCAP   Exercise Target Goals: Exercise Program Goal: Individual exercise prescription set using results from initial 6 min walk test and THRR while considering  patient's activity barriers and safety.   Exercise Prescription Goal: Initial exercise prescription builds to 30-45 minutes a day of aerobic activity, 2-3 days per week.  Home exercise guidelines will be given to patient during program as part of exercise prescription that the participant will acknowledge.  Activity Barriers & Risk Stratification: Activity Barriers & Cardiac Risk Stratification - 04/21/18 0838      Activity Barriers & Cardiac Risk Stratification   Activity Barriers  Balance Concerns   Balance concerns secondary to multiple sclerosis   Cardiac Risk Stratification  High       6 Minute Walk: 6 Minute Walk    Row Name 04/21/18 0822         6 Minute Walk   Phase  Initial     Distance  1340 feet     Walk Time  6 minutes     #  of Rest Breaks  0     MPH  2.54     METS  3.94     RPE  11     Perceived Dyspnea   3     VO2 Peak  13.78     Symptoms  Yes (comment)     Comments  Dyspnea     Resting HR  95 bpm     Resting BP  98/60     Resting Oxygen Saturation   98 %     Exercise Oxygen Saturation  during 6 min walk  98 %     Max Ex. HR  130 bpm     Max Ex. BP  120/70     2 Minute Post BP  108/60        Oxygen Initial Assessment:   Oxygen Re-Evaluation:   Oxygen Discharge (Final Oxygen Re-Evaluation):   Initial Exercise Prescription: Initial Exercise Prescription - 04/21/18 1000      Date of Initial Exercise RX and Referring Provider   Date  04/21/18    Referring Provider  Loralie Champagne, MD      Treadmill   MPH   1.7    Grade  0    Minutes  10    METs  1.7      Recumbant Bike   Level  2    Watts  25    Minutes  10    METs  2.58      NuStep   Level  2    SPM  85    Minutes  10    METs  2.5      Track   Laps  10    Minutes  10    METs  2.74      Prescription Details   Frequency (times per week)  3    Duration  Progress to 30 minutes of continuous aerobic without signs/symptoms of physical distress      Intensity   THRR 40-80% of Max Heartrate  74-148    Ratings of Perceived Exertion  11-13    Perceived Dyspnea  0-4      Progression   Progression  Continue to progress workloads to maintain intensity without signs/symptoms of physical distress.      Resistance Training   Training Prescription  Yes    Weight  3lbs       Perform Capillary Blood Glucose checks as needed.  Exercise Prescription Changes:   Exercise Comments:   Exercise Goals and Review: Exercise Goals    Row Name 04/21/18 0902             Exercise Goals   Increase Physical Activity  Yes       Intervention  Provide advice, education, support and counseling about physical activity/exercise needs.;Develop an individualized exercise prescription for aerobic and resistive training based on initial evaluation findings, risk stratification, comorbidities and participant's personal goals.       Expected Outcomes  Short Term: Attend rehab on a regular basis to increase amount of physical activity.;Long Term: Exercising regularly at least 3-5 days a week.;Long Term: Add in home exercise to make exercise part of routine and to increase amount of physical activity.       Increase Strength and Stamina  Yes       Intervention  Provide advice, education, support and counseling about physical activity/exercise needs.;Develop an individualized exercise prescription for aerobic and resistive training based on initial evaluation findings, risk stratification, comorbidities and participant's  personal goals.       Expected  Outcomes  Short Term: Increase workloads from initial exercise prescription for resistance, speed, and METs.;Short Term: Perform resistance training exercises routinely during rehab and add in resistance training at home;Long Term: Improve cardiorespiratory fitness, muscular endurance and strength as measured by increased METs and functional capacity (6MWT)       Able to understand and use rate of perceived exertion (RPE) scale  Yes       Intervention  Provide education and explanation on how to use RPE scale       Expected Outcomes  Short Term: Able to use RPE daily in rehab to express subjective intensity level;Long Term:  Able to use RPE to guide intensity level when exercising independently       Knowledge and understanding of Target Heart Rate Range (THRR)  Yes       Intervention  Provide education and explanation of THRR including how the numbers were predicted and where they are located for reference       Expected Outcomes  Short Term: Able to state/look up THRR;Long Term: Able to use THRR to govern intensity when exercising independently;Short Term: Able to use daily as guideline for intensity in rehab       Able to check pulse independently  Yes       Intervention  Provide education and demonstration on how to check pulse in carotid and radial arteries.;Review the importance of being able to check your own pulse for safety during independent exercise       Expected Outcomes  Short Term: Able to explain why pulse checking is important during independent exercise;Long Term: Able to check pulse independently and accurately       Understanding of Exercise Prescription  Yes       Intervention  Provide education, explanation, and written materials on patient's individual exercise prescription       Expected Outcomes  Short Term: Able to explain program exercise prescription;Long Term: Able to explain home exercise prescription to exercise independently          Exercise Goals Re-Evaluation  :   Discharge Exercise Prescription (Final Exercise Prescription Changes):   Nutrition:  Target Goals: Understanding of nutrition guidelines, daily intake of sodium 1500mg , cholesterol 200mg , calories 30% from fat and 7% or less from saturated fats, daily to have 5 or more servings of fruits and vegetables.  Biometrics: Pre Biometrics - 04/21/18 1029      Pre Biometrics   Height  5\' 7"  (1.702 m)    Weight  (!) 136.1 kg    Waist Circumference  42 inches    Hip Circumference  56.25 inches    Waist to Hip Ratio  0.75 %    BMI (Calculated)  46.98    Triceps Skinfold  49 mm    % Body Fat  51.6 %    Grip Strength  31.5 kg    Flexibility  0 in    Single Leg Stand  3.41 seconds        Nutrition Therapy Plan and Nutrition Goals: Nutrition Therapy & Goals - 04/21/18 0851      Nutrition Therapy   Diet  heart healthy      Personal Nutrition Goals   Nutrition Goal  Pt to identify and limit food sources of saturated fat, trans fat, refined carbohydrates and sodium    Personal Goal #2  Pt to identify food quantities necessary to achieve weight loss of 6-24 lbs. at graduation  from cardiac rehab      Intervention Plan   Intervention  Prescribe, educate and counsel regarding individualized specific dietary modifications aiming towards targeted core components such as weight, hypertension, lipid management, diabetes, heart failure and other comorbidities.    Expected Outcomes  Short Term Goal: Understand basic principles of dietary content, such as calories, fat, sodium, cholesterol and nutrients.;Long Term Goal: Adherence to prescribed nutrition plan.       Nutrition Assessments: Nutrition Assessments - 04/21/18 0852      MEDFICTS Scores   Pre Score  62       Nutrition Goals Re-Evaluation: Nutrition Goals Re-Evaluation    [place] Name 04/21/18 0851             Goals   Current Weight  300 lb 0.7 oz (136.1 kg)          Nutrition Goals Re-Evaluation: Nutrition Goals  Re-Evaluation    [place] Name 04/21/18 0851             Goals   Current Weight  300 lb 0.7 oz (136.1 kg)          Nutrition Goals Discharge (Final Nutrition Goals Re-Evaluation): Nutrition Goals Re-Evaluation - 04/21/18 0851      Goals   Current Weight  300 lb 0.7 oz (136.1 kg)       Psychosocial: Target Goals: Acknowledge presence or absence of significant depression and/or stress, maximize coping skills, provide positive support system. Participant is able to verbalize types and ability to use techniques and skills needed for reducing stress and depression.  Initial Review & Psychosocial Screening: Initial Psych Review & Screening - 04/21/18 1020      Initial Review   Current issues with  None Identified;Current Stress Concerns    Source of Stress Concerns  Chronic Illness;Unable to perform yard/household activities;Financial      Family Dynamics   Good Support System?  Yes   husband      Barriers   Psychosocial barriers to participate in program  There are no identifiable barriers or psychosocial needs.      Screening Interventions   Interventions  Encouraged to exercise    Expected Outcomes  Short Term goal: Utilizing psychosocial counselor, staff and physician to assist with identification of specific Stressors or current issues interfering with healing process. Setting desired goal for each stressor or current issue identified.;Long Term Goal: Stressors or current issues are controlled or eliminated.;Short Term goal: Identification and review with participant of any Quality of Life or Depression concerns found by scoring the questionnaire.;Long Term goal: The participant improves quality of Life and PHQ9 Scores as seen by post scores and/or verbalization of changes       Quality of Life Scores: Quality of Life - 04/21/18 1021      Quality of Life   Select  Quality of Life      Quality of Life Scores   Health/Function Pre  18.07 %    Socioeconomic Pre  16.56 %     Psych/Spiritual Pre  23.1 %    Family Pre  21.6 %    GLOBAL Pre  19.24 %      Scores of 19 and below usually indicate a poorer quality of life in these areas.  A difference of  2-3 points is a clinically meaningful difference.  A difference of 2-3 points in the total score of the Quality of Life Index has been associated with significant improvement in overall quality of life, self-image, physical symptoms, and  general health in studies assessing change in quality of life.  PHQ-9: Recent Review Flowsheet Data    There is no flowsheet data to display.     Interpretation of Total Score  Total Score Depression Severity:  1-4 = Minimal depression, 5-9 = Mild depression, 10-14 = Moderate depression, 15-19 = Moderately severe depression, 20-27 = Severe depression   Psychosocial Evaluation and Intervention:   Psychosocial Re-Evaluation:   Psychosocial Discharge (Final Psychosocial Re-Evaluation):   Vocational Rehabilitation: Provide vocational rehab assistance to qualifying candidates.   Vocational Rehab Evaluation & Intervention: Vocational Rehab - 04/21/18 1021      Initial Vocational Rehab Evaluation & Intervention   Assessment shows need for Vocational Rehabilitation  Yes       Education: Education Goals: Education classes will be provided on a weekly basis, covering required topics. Participant will state understanding/return demonstration of topics presented.  Learning Barriers/Preferences: Learning Barriers/Preferences - 04/21/18 0837      Learning Barriers/Preferences   Learning Barriers  None    Learning Preferences  None       Education Topics: Count Your Pulse:  -Group instruction provided by verbal instruction, demonstration, patient participation and written materials to support subject.  Instructors address importance of being able to find your pulse and how to count your pulse when at home without a heart monitor.  Patients get hands on experience counting  their pulse with staff help and individually.   Heart Attack, Angina, and Risk Factor Modification:  -Group instruction provided by verbal instruction, video, and written materials to support subject.  Instructors address signs and symptoms of angina and heart attacks.    Also discuss risk factors for heart disease and how to make changes to improve heart health risk factors.   Functional Fitness:  -Group instruction provided by verbal instruction, demonstration, patient participation, and written materials to support subject.  Instructors address safety measures for doing things around the house.  Discuss how to get up and down off the floor, how to pick things up properly, how to safely get out of a chair without assistance, and balance training.   Meditation and Mindfulness:  -Group instruction provided by verbal instruction, patient participation, and written materials to support subject.  Instructor addresses importance of mindfulness and meditation practice to help reduce stress and improve awareness.  Instructor also leads participants through a meditation exercise.    Stretching for Flexibility and Mobility:  -Group instruction provided by verbal instruction, patient participation, and written materials to support subject.  Instructors lead participants through series of stretches that are designed to increase flexibility thus improving mobility.  These stretches are additional exercise for major muscle groups that are typically performed during regular warm up and cool down.   Hands Only CPR:  -Group verbal, video, and participation provides a basic overview of AHA guidelines for community CPR. Role-play of emergencies allow participants the opportunity to practice calling for help and chest compression technique with discussion of AED use.   Hypertension: -Group verbal and written instruction that provides a basic overview of hypertension including the most recent diagnostic  guidelines, risk factor reduction with self-care instructions and medication management.    Nutrition I class: Heart Healthy Eating:  -Group instruction provided by PowerPoint slides, verbal discussion, and written materials to support subject matter. The instructor gives an explanation and review of the Therapeutic Lifestyle Changes diet recommendations, which includes a discussion on lipid goals, dietary fat, sodium, fiber, plant stanol/sterol esters, sugar, and the components of a well-balanced,  healthy diet.   Nutrition II class: Lifestyle Skills:  -Group instruction provided by PowerPoint slides, verbal discussion, and written materials to support subject matter. The instructor gives an explanation and review of label reading, grocery shopping for heart health, heart healthy recipe modifications, and ways to make healthier choices when eating out.   Diabetes Question & Answer:  -Group instruction provided by PowerPoint slides, verbal discussion, and written materials to support subject matter. The instructor gives an explanation and review of diabetes co-morbidities, pre- and post-prandial blood glucose goals, pre-exercise blood glucose goals, signs, symptoms, and treatment of hypoglycemia and hyperglycemia, and foot care basics.   Diabetes Blitz:  -Group instruction provided by PowerPoint slides, verbal discussion, and written materials to support subject matter. The instructor gives an explanation and review of the physiology behind type 1 and type 2 diabetes, diabetes medications and rational behind using different medications, pre- and post-prandial blood glucose recommendations and Hemoglobin A1c goals, diabetes diet, and exercise including blood glucose guidelines for exercising safely.    Portion Distortion:  -Group instruction provided by PowerPoint slides, verbal discussion, written materials, and food models to support subject matter. The instructor gives an explanation of serving  size versus portion size, changes in portions sizes over the last 20 years, and what consists of a serving from each food group.   Stress Management:  -Group instruction provided by verbal instruction, video, and written materials to support subject matter.  Instructors review role of stress in heart disease and how to cope with stress positively.     Exercising on Your Own:  -Group instruction provided by verbal instruction, power point, and written materials to support subject.  Instructors discuss benefits of exercise, components of exercise, frequency and intensity of exercise, and end points for exercise.  Also discuss use of nitroglycerin and activating EMS.  Review options of places to exercise outside of rehab.  Review guidelines for sex with heart disease.   Cardiac Drugs I:  -Group instruction provided by verbal instruction and written materials to support subject.  Instructor reviews cardiac drug classes: antiplatelets, anticoagulants, beta blockers, and statins.  Instructor discusses reasons, side effects, and lifestyle considerations for each drug class.   Cardiac Drugs II:  -Group instruction provided by verbal instruction and written materials to support subject.  Instructor reviews cardiac drug classes: angiotensin converting enzyme inhibitors (ACE-I), angiotensin II receptor blockers (ARBs), nitrates, and calcium channel blockers.  Instructor discusses reasons, side effects, and lifestyle considerations for each drug class.   Anatomy and Physiology of the Circulatory System:  Group verbal and written instruction and models provide basic cardiac anatomy and physiology, with the coronary electrical and arterial systems. Review of: AMI, Angina, Valve disease, Heart Failure, Peripheral Artery Disease, Cardiac Arrhythmia, Pacemakers, and the ICD.   Other Education:  -Group or individual verbal, written, or video instructions that support the educational goals of the cardiac rehab  program.   Holiday Eating Survival Tips:  -Group instruction provided by PowerPoint slides, verbal discussion, and written materials to support subject matter. The instructor gives patients tips, tricks, and techniques to help them not only survive but enjoy the holidays despite the onslaught of food that accompanies the holidays.   Knowledge Questionnaire Score: Knowledge Questionnaire Score - 04/21/18 1023      Knowledge Questionnaire Score   Pre Score  20/24       Core Components/Risk Factors/Patient Goals at Admission: Personal Goals and Risk Factors at Admission - 04/21/18 1040      Core Components/Risk  Factors/Patient Goals on Admission    Weight Management  Obesity;Yes    Intervention  Weight Management/Obesity: Establish reasonable short term and long term weight goals.;Obesity: Provide education and appropriate resources to help participant work on and attain dietary goals.    Admit Weight  300 lb 0.7 oz (136.1 kg)    Goal Weight: Short Term  294 lb (133.4 kg)    Goal Weight: Long Term  200 lb (90.7 kg)    Expected Outcomes  Short Term: Continue to assess and modify interventions until short term weight is achieved;Long Term: Adherence to nutrition and physical activity/exercise program aimed toward attainment of established weight goal;Weight Loss: Understanding of general recommendations for a balanced deficit meal plan, which promotes 1-2 lb weight loss per week and includes a negative energy balance of [id number] kcal/d;Understanding recommendations for meals to include 15-35% energy as protein, 25-35% energy from fat, 35-60% energy from carbohydrates, less than 200mg  of dietary cholesterol, 20-35 gm of total fiber daily;Understanding of distribution of calorie intake throughout the day with the consumption of 4-5 meals/snacks    Heart Failure  Yes    Intervention  Provide a combined exercise and nutrition program that is supplemented with education, support and counseling about  heart failure. Directed toward relieving symptoms such as shortness of breath, decreased exercise tolerance, and extremity edema.    Expected Outcomes  Improve functional capacity of life;Short term: Attendance in program 2-3 days a week with increased exercise capacity. Reported lower sodium intake. Reported increased fruit and vegetable intake. Reports medication compliance.;Short term: Daily weights obtained and reported for increase. Utilizing diuretic protocols set by physician.;Long term: Adoption of self-care skills and reduction of barriers for early signs and symptoms recognition and intervention leading to self-care maintenance.    Hypertension  Yes    Intervention  Provide education on lifestyle modifcations including regular physical activity/exercise, weight management, moderate sodium restriction and increased consumption of fresh fruit, vegetables, and low fat dairy, alcohol moderation, and smoking cessation.;Monitor prescription use compliance.    Expected Outcomes  Short Term: Continued assessment and intervention until BP is < 140/8mm HG in hypertensive participants. < 130/5mm HG in hypertensive participants with diabetes, heart failure or chronic kidney disease.;Long Term: Maintenance of blood pressure at goal levels.       Core Components/Risk Factors/Patient Goals Review:    Core Components/Risk Factors/Patient Goals at Discharge (Final Review):    ITP Comments: ITP Comments    Row Name 04/21/18 0835           ITP Comments  Medical Director - Dr. Fransico Him, MD          Comments: Jenetta Downer attended orientation from 0730 to [phone] to review rules and guidelines for program. Completed 6 minute walk test, Intitial ITP, and exercise prescription.  VSS. Telemetry-Sinus Rhythm occasional PVC.  Lester complained of shortness of breath during her walk test. Oxygen saturation's ranged from 97%-98%. [name]'s shortness of breath resolved with rest.Emma Schupp, RN,BSN 04/21/2018  11:41 AM

## 2018-04-22 ENCOUNTER — Encounter (HOSPITAL_COMMUNITY): Payer: PRIVATE HEALTH INSURANCE

## 2018-04-22 ENCOUNTER — Telehealth (HOSPITAL_COMMUNITY): Payer: Self-pay | Admitting: Cardiac Rehabilitation

## 2018-04-22 ENCOUNTER — Telehealth (HOSPITAL_COMMUNITY): Payer: Self-pay | Admitting: Pharmacist

## 2018-04-22 NOTE — Telephone Encounter (Signed)
-----   Message from Larey Dresser, MD sent at 04/21/2018 10:50 PM EDT ----- Regarding: RE: cardiac rehab  There should be.  We will work on this.  ----- Message ----- From: Lowell Guitar, RN Sent: 04/21/2018   9:06 AM EDT To: Larey Dresser, MD Subject: cardiac rehab                                  Dear Dr. Aundra Dubin, [place] states she is can not afford corlanor.  Is there a patient assistance program or samples to help her?  Or would it be ok for her to discontinue?  Thank you, Andi Hence, RN, BSN Cardiac Pulmonary Rehab

## 2018-04-22 NOTE — Telephone Encounter (Signed)
-----   Message from Larey Dresser, MD sent at 04/21/2018 10:50 PM EDT ----- Regarding: RE: cardiac rehab  There should be.  We will work on this.  ----- Message ----- From: Lowell Guitar, RN Sent: 04/21/2018   9:06 AM EDT To: Larey Dresser, MD Subject: cardiac rehab                                  Dear Dr. Aundra Dubin, [place] states she is can not afford corlanor.  Is there a patient assistance program or samples to help her?  Or would it be ok for her to discontinue?  Thank you, Andi Hence, RN, BSN Cardiac Pulmonary Rehab

## 2018-04-22 NOTE — Telephone Encounter (Signed)
Since patient has [employer] fund[employer], she can use the Corlanor $20 copay card. She can download online or we can give to her in clinic. I have called and left her a VM with this information.   [name], PharmD, BCPS, CPP Clinical Pharmacist Phone: [phone] 04/22/2018 3:31 PM

## 2018-04-23 ENCOUNTER — Telehealth: Payer: Self-pay | Admitting: *Deleted

## 2018-04-23 NOTE — Progress Notes (Signed)
Patient notified of normal sleep study results. 

## 2018-04-23 NOTE — Telephone Encounter (Signed)
Patient notified of normal sleep study results. No other recommendations were made at this time.

## 2018-04-23 NOTE — Telephone Encounter (Signed)
-----   Message from Sueanne Margarita, MD sent at 04/20/2018  6:37 PM EDT ----- Please let patient know that sleep study showed no significant sleep apnea.

## 2018-04-24 ENCOUNTER — Encounter (HOSPITAL_COMMUNITY): Payer: PRIVATE HEALTH INSURANCE

## 2018-04-27 ENCOUNTER — Encounter (HOSPITAL_COMMUNITY): Payer: PRIVATE HEALTH INSURANCE

## 2018-04-27 ENCOUNTER — Encounter (HOSPITAL_COMMUNITY)
Admission: RE | Admit: 2018-04-27 | Discharge: 2018-04-27 | Disposition: A | Discharge status: Home / Self Care | Payer: PRIVATE HEALTH INSURANCE | Source: Ambulatory Visit | Attending: Cardiology | Admitting: Cardiology

## 2018-04-27 ENCOUNTER — Encounter (HOSPITAL_COMMUNITY): Payer: Self-pay

## 2018-04-27 DIAGNOSIS — I5022 Chronic systolic (congestive) heart failure: Secondary | ICD-10-CM

## 2018-04-27 DIAGNOSIS — I110 Hypertensive heart disease with heart failure: Secondary | ICD-10-CM | POA: Diagnosis not present

## 2018-04-27 NOTE — Progress Notes (Signed)
Daily Session Note  Patient Details  Name: Amanda [name] MRN: [id number] Date of Birth: 02/18/1981 Referring Provider:     CARDIAC REHAB PHASE II ORIENTATION from 04/21/2018 in [facility]  Referring Provider  McLean, Dalton, MD      Encounter Date: 04/27/2018  Check In: Session Check In - 04/27/18 0716      Check-In   Supervising physician immediately available to respond to emergencies  [facility] immediately available    Physician(s)  Dr. Ugah     Location  [facility]    Staff Present  Joann Rion, RN, BSN;Tara Everett, RN, BSN;Tyara Nevels, MS,ACSM CEP, Exercise Physiologist;Molly DiVincenzo, MS, ACSM RCEP, Exercise Physiologist    Medication changes reported      No    Fall or balance concerns reported     No    Tobacco Cessation  No Change    Warm-up and Cool-down  Performed as group-led instruction    Resistance Training Performed  Yes    VAD Patient?  No    PAD/SET Patient?  No      Pain Assessment   Currently in Pain?  No/denies       Capillary Blood Glucose: No results found for this or any previous visit (from the past 24 hour(s)).  Exercise Prescription Changes - 04/27/18 0800      Response to Exercise   Blood Pressure (Admit)  120/70    Blood Pressure (Exercise)  118/60    Blood Pressure (Exit)  101/70    Heart Rate (Admit)  99 bpm    Heart Rate (Exercise)  129 bpm    Heart Rate (Exit)  87 bpm    Rating of Perceived Exertion (Exercise)  12    Perceived Dyspnea (Exercise)  0    Symptoms  None     Comments  Pt oriented to exercise equipment     Duration  Progress to 30 minutes of  aerobic without signs/symptoms of physical distress    Intensity  THRR New      Progression   Progression  Continue to progress workloads to maintain intensity without signs/symptoms of physical distress.    Average METs  2.2      Resistance Training   Training Prescription  Yes    Weight  3lbs    Reps  10-15    Time   10 Minutes      Treadmill   MPH  1.9    Grade  0    Minutes  10    METs  2.45      Recumbant Bike   Level  2    Watts  25    Minutes  10    METs  2.58      NuStep   Level  2    SPM  85    Minutes  10    METs  1.9       Social History   Tobacco Use  Smoking Status Never Smoker  Smokeless Tobacco Never Used    Goals Met:  Exercise tolerated well  Goals Unmet:  Not Applicable  Comments: Pt started cardiac rehab today.  Pt tolerated light exercise without difficulty. VSS, telemetry-SR, asymptomatic.  Medication list reconciled. Pt denies barriers to medicaiton compliance.  PSYCHOSOCIAL ASSESSMENT:  PHQ-0. Pt exhibits positive coping skills, hopeful outlook with supportive family. No psychosocial needs identified at this time, no psychosocial interventions necessary.  Pt oriented to exercise equipment and routine.    Understanding verbalized.    Dr. Traci Turner is Medical Director for Cardiac Rehab at [facility]. 

## 2018-04-29 ENCOUNTER — Encounter (HOSPITAL_COMMUNITY): Payer: PRIVATE HEALTH INSURANCE

## 2018-05-01 ENCOUNTER — Encounter (HOSPITAL_COMMUNITY): Payer: PRIVATE HEALTH INSURANCE

## 2018-05-01 ENCOUNTER — Telehealth (HOSPITAL_COMMUNITY): Payer: Self-pay | Admitting: *Deleted

## 2018-05-01 ENCOUNTER — Encounter (HOSPITAL_COMMUNITY)
Admission: RE | Admit: 2018-05-01 | Discharge: 2018-05-01 | Disposition: A | Discharge status: Home / Self Care | Payer: PRIVATE HEALTH INSURANCE | Source: Ambulatory Visit | Attending: Cardiology | Admitting: Cardiology

## 2018-05-01 DIAGNOSIS — I110 Hypertensive heart disease with heart failure: Secondary | ICD-10-CM | POA: Diagnosis not present

## 2018-05-01 DIAGNOSIS — I5022 Chronic systolic (congestive) heart failure: Secondary | ICD-10-CM

## 2018-05-01 NOTE — Telephone Encounter (Signed)
Pt walked into clinic requesting samples of Corlanor, she states the cost is $200 and she can not afford that.  Per previous phone notes Amanda [name], Pharm D had tried to reach her to let her know that she can use the copay card.  Provided pt with 1 bottle of samples and the copay card, advised if any further issues to please let us know.  Medication Samples have been provided to the patient.  Drug name: Corlanor       Strength: 5mg         Qty: 1   LOT: [id number]  Exp.Date: 11/21  Dosing instructions: Take 1/2 tab Twice daily   The patient has been instructed regarding the correct time, dose, and frequency of taking this medication, including desired effects and most common side effects.   Amanda [name] 8:25 AM 05/01/2018

## 2018-05-04 ENCOUNTER — Encounter (HOSPITAL_COMMUNITY): Payer: PRIVATE HEALTH INSURANCE

## 2018-05-04 ENCOUNTER — Encounter (HOSPITAL_COMMUNITY)
Admission: RE | Admit: 2018-05-04 | Discharge: 2018-05-04 | Disposition: A | Discharge status: Home / Self Care | Payer: PRIVATE HEALTH INSURANCE | Source: Ambulatory Visit | Attending: Cardiology | Admitting: Cardiology

## 2018-05-04 DIAGNOSIS — I110 Hypertensive heart disease with heart failure: Secondary | ICD-10-CM | POA: Diagnosis not present

## 2018-05-04 DIAGNOSIS — I5022 Chronic systolic (congestive) heart failure: Secondary | ICD-10-CM

## 2018-05-04 NOTE — Progress Notes (Signed)
HF MD: [facility]  HPI:  Amanda [name] is a 37 y.o. female with a history of multiple sclerosis, anemia due to uterine fibroids, and systolic HF (dx 01/8340).  Admitted to [facility] 5/31-01/13/18  with volume overload and found to have acute systolic heart failure. Echo showed EF 10-15%. HF team consulted. R/LHC completed, which showed no CAD and preserved CO. Cardiac MRI showed no LGE, EF 14%. She was diuresed with IV lasix and transitioned to lasix 40 mg daily. HF meds were optimized. DC weight: 299 lbs.  Today she returns for pharmacist-led HF medication titration. At last HF clinic visit on 9/3, her carvedilol was increased to 12.5 mg BID. She has been doing better overall although she has felt more fatigued since carvedilol was increased.  Weight down 2 lbs.  No dyspnea walking on flat ground.  She does get short of breath walking up a hill. She has started cardiac rehab 3x/week and feels well doing this. 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 She reports compliance with her medications but did not take them this am.    . Shortness of breath/dyspnea on exertion? no  . Orthopnea/PND? no . Edema? no . Lightheadedness/dizziness? no . Daily weights at home? Yes - stable ~296-298 lb . Blood pressure/heart rate monitoring at home? no . Following low-sodium/fluid-restricted diet? yes  HF Medications: Carvedilol 12.5 mg PO BID Furosemide 40 mg PO daily Corlanor 2.5 mg PO BID KCl 20 mEq PO daily Entresto 49-51 mg PO BID Spironolactone 25 mg PO daily  Has the patient been experiencing any side effects to the medications prescribed?  Yes - fatigue with increase in carvedilol   Does the patient have any problems obtaining medications due to transportation or finances?   No - [employer] insurance, now has Corlanor copay card to use  Understanding of regimen: good Understanding of indications: good Potential of  compliance: good Patient understands to avoid NSAIDs. Patient understands to avoid decongestants.    Pertinent Lab Values: 04/14/2018: Serum creatinine 0.69, BUN 13, Potassium 3.9, Sodium 142  Vital Signs: . Weight: 298.6 lb (dry weight: 300 lb) . Blood pressure: 130/84 mmHg  . Heart rate: 72 bpm   Assessment: 1. Chronic systolic CHF (EF 96-22%>>29%), due to NICM (?viral). NYHA class II symptoms. - Volume status stable - Increase Entresto to 97-103 mg BID and decrease furosemide to 20 mg daily - Continue carvedilol 12.5 mg BID, Corlanor 2.5 mg BID, KCl 20 meq daily, and spironolactone 25 mg daily - Asked her to take her medications the am before her next visit to better assess effects on vital signs - Also warned her about adverse CV effects of ibuprofen and recommended APAP instead for minor aches/pains - Discussed eventual addition of Bidil for its morbidity/mortality benefits in African American patients - EF has improved some on most recent echo.  Repeat echo in 3 more months.  If EF > 35%, can avoid ICD.  Narrow QRS so not CRT candidate.    - Basic disease state pathophysiology, medication indication, mechanism and side effects reviewed at length with patient and she verbalized understanding 2. History of uterine fibroids with bleeding/anemia: Stable currently.  3. Multiple sclerosis: Quiescent currently. Aubagio, her MS medication, does not appear to have CHF as a side effect. Only cardiac side effect appears to be HTN. 4. Snores: Set up for sleep study 5. Obesity: We discussed weight loss with diet and  exercise.  Body mass index is 48.16 kg/m.    Plan: 1) Medication changes: Based on clinical presentation, vital signs and recent labs will increase Entresto to goal 97-103 mg BID and decrease furosemide to 20 mg daily 2) Labs: BMET at next visit 3) Follow-up: Pharmacy visit on 10/15 and Dr. Aundra Dubin on 11/5   [name], PharmD, BCPS, CPP Clinical Pharmacist Phone:  [phone] 05/04/2018 9:42 AM

## 2018-05-05 ENCOUNTER — Ambulatory Visit (HOSPITAL_COMMUNITY)
Admission: RE | Admit: 2018-05-05 | Discharge: 2018-05-05 | Disposition: A | Discharge status: Home / Self Care | Payer: PRIVATE HEALTH INSURANCE | Source: Ambulatory Visit | Attending: Cardiology | Admitting: Cardiology

## 2018-05-05 VITALS — BP 130/84 | HR 72 | Wt 298.6 lb

## 2018-05-05 DIAGNOSIS — D649 Anemia, unspecified: Secondary | ICD-10-CM | POA: Diagnosis present

## 2018-05-05 DIAGNOSIS — G35 Multiple sclerosis: Secondary | ICD-10-CM | POA: Diagnosis present

## 2018-05-05 DIAGNOSIS — I5022 Chronic systolic (congestive) heart failure: Secondary | ICD-10-CM | POA: Insufficient documentation

## 2018-05-05 DIAGNOSIS — D259 Leiomyoma of uterus, unspecified: Secondary | ICD-10-CM | POA: Insufficient documentation

## 2018-05-05 DIAGNOSIS — D500 Iron deficiency anemia secondary to blood loss (chronic): Secondary | ICD-10-CM | POA: Insufficient documentation

## 2018-05-05 DIAGNOSIS — Z6841 Body Mass Index (BMI) 40.0 and over, adult: Secondary | ICD-10-CM | POA: Insufficient documentation

## 2018-05-05 DIAGNOSIS — I5021 Acute systolic (congestive) heart failure: Secondary | ICD-10-CM

## 2018-05-05 DIAGNOSIS — E669 Obesity, unspecified: Secondary | ICD-10-CM | POA: Diagnosis not present

## 2018-05-05 MED ORDER — SACUBITRIL-VALSARTAN 97-103 MG PO TABS: 1.0000 | ORAL_TABLET | Freq: Two times a day (BID) | ORAL | 5 refills | Status: DC

## 2018-05-05 MED ORDER — FUROSEMIDE 40 MG PO TABS: 20.0000 mg | ORAL_TABLET | Freq: Every day | ORAL | 5 refills | Status: DC

## 2018-05-05 MED ORDER — IVABRADINE HCL 5 MG PO TABS: 2.5000 mg | ORAL_TABLET | Freq: Two times a day (BID) | ORAL | 3 refills | Status: DC

## 2018-05-05 NOTE — Patient Instructions (Addendum)
It was great to meet you today!  Please INCREASE Entresto to 97-103 mg TWICE DAILY.  Please DECREASE your furosemide to 1/2 tablet (20 mg) ONCE DAILY.   You are scheduled with the pharmacist again on 10/15 and Dr. Aundra Dubin on 11/5.

## 2018-05-06 ENCOUNTER — Encounter (HOSPITAL_COMMUNITY): Payer: PRIVATE HEALTH INSURANCE

## 2018-05-06 ENCOUNTER — Encounter (HOSPITAL_COMMUNITY)
Admission: RE | Admit: 2018-05-06 | Discharge: 2018-05-06 | Disposition: A | Discharge status: Home / Self Care | Payer: PRIVATE HEALTH INSURANCE | Source: Ambulatory Visit | Attending: Cardiology | Admitting: Cardiology

## 2018-05-06 DIAGNOSIS — I5022 Chronic systolic (congestive) heart failure: Secondary | ICD-10-CM

## 2018-05-06 DIAGNOSIS — I110 Hypertensive heart disease with heart failure: Secondary | ICD-10-CM | POA: Diagnosis not present

## 2018-05-08 ENCOUNTER — Encounter (HOSPITAL_COMMUNITY): Payer: PRIVATE HEALTH INSURANCE

## 2018-05-11 ENCOUNTER — Encounter (HOSPITAL_COMMUNITY): Payer: PRIVATE HEALTH INSURANCE

## 2018-05-13 ENCOUNTER — Encounter (HOSPITAL_COMMUNITY): Payer: PRIVATE HEALTH INSURANCE

## 2018-05-13 ENCOUNTER — Encounter (HOSPITAL_COMMUNITY)
Admission: RE | Admit: 2018-05-13 | Discharge: 2018-05-13 | Disposition: A | Discharge status: Home / Self Care | Payer: PRIVATE HEALTH INSURANCE | Source: Ambulatory Visit | Attending: Cardiology | Admitting: Cardiology

## 2018-05-13 DIAGNOSIS — Z79899 Other long term (current) drug therapy: Secondary | ICD-10-CM | POA: Diagnosis not present

## 2018-05-13 DIAGNOSIS — I110 Hypertensive heart disease with heart failure: Secondary | ICD-10-CM | POA: Diagnosis not present

## 2018-05-13 DIAGNOSIS — G35 Multiple sclerosis: Secondary | ICD-10-CM | POA: Insufficient documentation

## 2018-05-13 DIAGNOSIS — I5022 Chronic systolic (congestive) heart failure: Secondary | ICD-10-CM | POA: Insufficient documentation

## 2018-05-13 DIAGNOSIS — D649 Anemia, unspecified: Secondary | ICD-10-CM | POA: Diagnosis not present

## 2018-05-13 NOTE — Progress Notes (Signed)
Amanda [name] 37 y.o. female Nutrition Note Nutrition Note Spoke with pt. Nutrition plan and goals reviewed with pt. Pt is following Step 1 of the Therapeutic Lifestyle Changes diet. Pt wants to lose wt. Pt has been trying to lose wt by eating smaller portions and not salting foods at the dinner table. Wt loss tips reviewed (label reading, how to build a healthy plate, portion sizes, eating frequently across the day). Set goal with patient to start eating more frequently across the day as she currently eats about 2-3 meals, and will skip snacks . Pt with dx of CHF. Per discussion, pt does not use canned/convenience foods often. Pt rarely adds salt to food. Pt eats out infrequently. Pt expressed understanding of the information reviewed. Pt aware of nutrition education classes offered and plans on attending nutrition classes.  No results found for: HGBA1C  Wt Readings from Last 3 Encounters:  05/05/18 298 lb 9.6 oz (135.4 kg)  04/21/18 (!) 300 lb 0.7 oz (136.1 kg)  04/19/18 298 lb (135.2 kg)    Nutrition Diagnosis  Obesity related to excessive energy intake as evidenced by a BMI 48.10  Nutrition Intervention ? Pt's individual nutrition plan reviewed with pt. ? Benefits of adopting Heart Healthy diet discussed when Medficts reviewed.   Goal(s)  Pt to identify and limit food sources of saturated fat, trans fat, refined carbohydrates and sodium  Pt to identify food quantities necessary to achieve weight loss of 6-24 lbs. at graduation from cardiac rehab. Goal wt of ~200 lb desired.  Plan:   Pt to attend nutrition classes ? Nutrition I ? Nutrition II ? Portion Distortion   Will provide client-centered nutrition education as part of interdisciplinary care  Monitor and evaluate progress toward nutrition goal with team.    Laurina Bustle, MS, RD, LDN 05/13/2018 8:11 AM

## 2018-05-14 ENCOUNTER — Encounter (HOSPITAL_COMMUNITY): Payer: Self-pay

## 2018-05-14 NOTE — Progress Notes (Signed)
Cardiac Individual Treatment Plan  Patient Details  Name: Amanda [name] MRN: [id number] Date of Birth: April 16, 1981 Referring Provider:     CARDIAC REHAB PHASE II ORIENTATION from 04/21/2018 in [facility]  Referring Provider  Loralie Champagne, MD      Initial Encounter Date:    CARDIAC REHAB PHASE II ORIENTATION from 04/21/2018 in [facility]  Date  04/21/18      Visit Diagnosis: Heart failure, chronic systolic (HCC)  Patient's Home Medications on Admission:  Current Outpatient Medications:  .  carvedilol (COREG) 12.5 MG tablet, Take 1 tablet (12.5 mg total) by mouth 2 (two) times daily with a meal., Disp: 60 tablet, Rfl: 6 .  ferrous sulfate 325 (65 FE) MG tablet, Take 1 tablet (325 mg total) by mouth daily with breakfast., Disp: 30 tablet, Rfl: 3 .  furosemide (LASIX) 40 MG tablet, Take 0.5 tablets (20 mg total) by mouth daily., Disp: 15 tablet, Rfl: 5 .  ibuprofen (ADVIL,MOTRIN) 200 MG tablet, Take 200 mg by mouth every 6 (six) hours as needed for headache., Disp: , Rfl:  .  ivabradine (CORLANOR) 5 MG TABS tablet, Take 0.5 tablets (2.5 mg total) by mouth 2 (two) times daily with a meal., Disp: 30 tablet, Rfl: 3 .  potassium chloride SA (K-DUR,KLOR-CON) 20 MEQ tablet, Take 1 tablet (20 mEq total) by mouth daily., Disp: 30 tablet, Rfl: 6 .  sacubitril-valsartan (ENTRESTO) 97-103 MG, Take 1 tablet by mouth 2 (two) times daily., Disp: 60 tablet, Rfl: 5 .  spironolactone (ALDACTONE) 25 MG tablet, Take 1 tablet (25 mg total) by mouth daily., Disp: 30 tablet, Rfl: 6 .  Teriflunomide (AUBAGIO) 14 MG TABS, Take 14 mg by mouth daily., Disp: , Rfl:   Past Medical History: Past Medical History:  Diagnosis Date  . Anemia   . BP (high blood pressure)   . CHF (congestive heart failure) ([place])   . History of blood transfusion 08/2017   "related to low blood count"   . Multiple sclerosis ([place])   . Pneumonia 12/2017    Tobacco  Use: Social History   Tobacco Use  Smoking Status Never Smoker  Smokeless Tobacco Never Used    Labs: Recent Review Scientist, physiological    Labs for ITP Cardiac and Pulmonary Rehab Latest Ref Rng & Units 01/12/2018 01/12/2018   HCO3 20.0 - 28.0 mmol/L 25.4 25.2   TCO2 22 - 32 mmol/L 27 26   O2SAT % 67.0 64.0      Capillary Blood Glucose: No results found for: GLUCAP   Exercise Target Goals: Exercise Program Goal: Individual exercise prescription set using results from initial 6 min walk test and THRR while considering  patient's activity barriers and safety.   Exercise Prescription Goal: Initial exercise prescription builds to 30-45 minutes a day of aerobic activity, 2-3 days per week.  Home exercise guidelines will be given to patient during program as part of exercise prescription that the participant will acknowledge.  Activity Barriers & Risk Stratification: Activity Barriers & Cardiac Risk Stratification - 04/21/18 0838      Activity Barriers & Cardiac Risk Stratification   Activity Barriers  Balance Concerns   Balance concerns secondary to multiple sclerosis   Cardiac Risk Stratification  High       6 Minute Walk: 6 Minute Walk    Row Name 04/21/18 0822         6 Minute Walk   Phase  Initial  Distance  1340 feet     Walk Time  6 minutes     # of Rest Breaks  0     MPH  2.54     METS  3.94     RPE  11     Perceived Dyspnea   3     VO2 Peak  13.78     Symptoms  Yes (comment)     Comments  Dyspnea     Resting HR  95 bpm     Resting BP  98/60     Resting Oxygen Saturation   98 %     Exercise Oxygen Saturation  during 6 min walk  98 %     Max Ex. HR  130 bpm     Max Ex. BP  120/70     2 Minute Post BP  108/60        Oxygen Initial Assessment:   Oxygen Re-Evaluation:   Oxygen Discharge (Final Oxygen Re-Evaluation):   Initial Exercise Prescription: Initial Exercise Prescription - 04/21/18 1000      Date of Initial Exercise RX and Referring  Provider   Date  04/21/18    Referring Provider  Loralie Champagne, MD      Treadmill   MPH  1.7    Grade  0    Minutes  10    METs  2.3      Recumbant Bike   Level  2    Watts  25    Minutes  10    METs  2.58      NuStep   Level  2    SPM  85    Minutes  10    METs  2.5      Track   Laps  10    Minutes  10    METs  2.74      Prescription Details   Frequency (times per week)  3    Duration  Progress to 30 minutes of continuous aerobic without signs/symptoms of physical distress      Intensity   THRR 40-80% of Max Heartrate  74-148    Ratings of Perceived Exertion  11-13    Perceived Dyspnea  0-4      Progression   Progression  Continue to progress workloads to maintain intensity without signs/symptoms of physical distress.      Resistance Training   Training Prescription  Yes    Weight  3lbs       Perform Capillary Blood Glucose checks as needed.  Exercise Prescription Changes: Exercise Prescription Changes    Row Name 04/27/18 0800 05/13/18 1600           Response to Exercise   Blood Pressure (Admit)  120/70  110/62      Blood Pressure (Exercise)  118/60  122/64      Blood Pressure (Exit)  101/70  110/58      Heart Rate (Admit)  99 bpm  99 bpm      Heart Rate (Exercise)  129 bpm  134 bpm      Heart Rate (Exit)  87 bpm  83 bpm      Rating of Perceived Exertion (Exercise)  12  12      Perceived Dyspnea (Exercise)  0  0      Symptoms  None   None      Comments  Pt oriented to exercise equipment   -      Duration  Progress  to 30 minutes of  aerobic without signs/symptoms of physical distress  Continue with 30 min of aerobic exercise without signs/symptoms of physical distress.      Intensity  THRR New  THRR unchanged        Progression   Progression  Continue to progress workloads to maintain intensity without signs/symptoms of physical distress.  Continue to progress workloads to maintain intensity without signs/symptoms of physical distress.       Average METs  2.2  2.08        Resistance Training   Training Prescription  Yes  No      Weight  3lbs  -      Reps  10-15  -      Time  10 Minutes  -        Interval Training   Interval Training  -  No        Treadmill   MPH  1.9  1.9      Grade  0  0      Minutes  10  10      METs  2.45  2.45        Recumbant Bike   Level  2  2      Watts  25  20      Minutes  10  10      METs  2.58  1.7        NuStep   Level  2  3      SPM  85  85      Minutes  10  10      METs  1.9  2.1         Exercise Comments: Exercise Comments    Row Name 04/27/18 0810           Exercise Comments  Pt oriented exercise equipment. Pt tolerated workloads well. Will continue to monitor and progress pt as tolerated.           Exercise Goals and Review: Exercise Goals    Row Name 04/21/18 0902             Exercise Goals   Increase Physical Activity  Yes       Intervention  Provide advice, education, support and counseling about physical activity/exercise needs.;Develop an individualized exercise prescription for aerobic and resistive training based on initial evaluation findings, risk stratification, comorbidities and participant's personal goals.       Expected Outcomes  Short Term: Attend rehab on a regular basis to increase amount of physical activity.;Long Term: Exercising regularly at least 3-5 days a week.;Long Term: Add in home exercise to make exercise part of routine and to increase amount of physical activity.       Increase Strength and Stamina  Yes       Intervention  Provide advice, education, support and counseling about physical activity/exercise needs.;Develop an individualized exercise prescription for aerobic and resistive training based on initial evaluation findings, risk stratification, comorbidities and participant's personal goals.       Expected Outcomes  Short Term: Increase workloads from initial exercise prescription for resistance, speed, and METs.;Short Term:  Perform resistance training exercises routinely during rehab and add in resistance training at home;Long Term: Improve cardiorespiratory fitness, muscular endurance and strength as measured by increased METs and functional capacity (6MWT)       Able to understand and use rate of perceived exertion (RPE) scale  Yes       Intervention  Provide education and explanation on how to use RPE scale       Expected Outcomes  Short Term: Able to use RPE daily in rehab to express subjective intensity level;Long Term:  Able to use RPE to guide intensity level when exercising independently       Knowledge and understanding of Target Heart Rate Range (THRR)  Yes       Intervention  Provide education and explanation of THRR including how the numbers were predicted and where they are located for reference       Expected Outcomes  Short Term: Able to state/look up THRR;Long Term: Able to use THRR to govern intensity when exercising independently;Short Term: Able to use daily as guideline for intensity in rehab       Able to check pulse independently  Yes       Intervention  Provide education and demonstration on how to check pulse in carotid and radial arteries.;Review the importance of being able to check your own pulse for safety during independent exercise       Expected Outcomes  Short Term: Able to explain why pulse checking is important during independent exercise;Long Term: Able to check pulse independently and accurately       Understanding of Exercise Prescription  Yes       Intervention  Provide education, explanation, and written materials on patient's individual exercise prescription       Expected Outcomes  Short Term: Able to explain program exercise prescription;Long Term: Able to explain home exercise prescription to exercise independently          Exercise Goals Re-Evaluation :   Discharge Exercise Prescription (Final Exercise Prescription Changes): Exercise Prescription Changes - 05/13/18 1600       Response to Exercise   Blood Pressure (Admit)  110/62    Blood Pressure (Exercise)  122/64    Blood Pressure (Exit)  110/58    Heart Rate (Admit)  99 bpm    Heart Rate (Exercise)  134 bpm    Heart Rate (Exit)  83 bpm    Rating of Perceived Exertion (Exercise)  12    Perceived Dyspnea (Exercise)  0    Symptoms  None    Duration  Continue with 30 min of aerobic exercise without signs/symptoms of physical distress.    Intensity  THRR unchanged      Progression   Progression  Continue to progress workloads to maintain intensity without signs/symptoms of physical distress.    Average METs  2.08      Resistance Training   Training Prescription  No      Interval Training   Interval Training  No      Treadmill   MPH  1.9    Grade  0    Minutes  10    METs  2.45      Recumbant Bike   Level  2    Watts  20    Minutes  10    METs  1.7      NuStep   Level  3    SPM  85    Minutes  10    METs  2.1       Nutrition:  Target Goals: Understanding of nutrition guidelines, daily intake of sodium 1500mg , cholesterol 200mg , calories 30% from fat and 7% or less from saturated fats, daily to have 5 or more servings of fruits and vegetables.  Biometrics: Pre Biometrics - 04/21/18 1029      Pre  Biometrics   Height  5\' 7"  (1.702 m)    Weight  (!) 136.1 kg    Waist Circumference  42 inches    Hip Circumference  56.25 inches    Waist to Hip Ratio  0.75 %    BMI (Calculated)  46.98    Triceps Skinfold  49 mm    % Body Fat  51.6 %    Grip Strength  31.5 kg    Flexibility  0 in    Single Leg Stand  3.41 seconds        Nutrition Therapy Plan and Nutrition Goals: Nutrition Therapy & Goals - 04/21/18 0851      Nutrition Therapy   Diet  heart healthy      Personal Nutrition Goals   Nutrition Goal  Pt to identify and limit food sources of saturated fat, trans fat, refined carbohydrates and sodium    Personal Goal #2  Pt to identify food quantities necessary to achieve  weight loss of 6-24 lbs. at graduation from cardiac rehab      Gas, educate and counsel regarding individualized specific dietary modifications aiming towards targeted core components such as weight, hypertension, lipid management, diabetes, heart failure and other comorbidities.    Expected Outcomes  Short Term Goal: Understand basic principles of dietary content, such as calories, fat, sodium, cholesterol and nutrients.;Long Term Goal: Adherence to prescribed nutrition plan.       Nutrition Assessments: Nutrition Assessments - 04/21/18 0852      MEDFICTS Scores   Pre Score  62       Nutrition Goals Re-Evaluation: Nutrition Goals Re-Evaluation    [place] Name 04/21/18 0851             Goals   Current Weight  300 lb 0.7 oz (136.1 kg)          Nutrition Goals Re-Evaluation: Nutrition Goals Re-Evaluation    [place] Name 04/21/18 0851             Goals   Current Weight  300 lb 0.7 oz (136.1 kg)          Nutrition Goals Discharge (Final Nutrition Goals Re-Evaluation): Nutrition Goals Re-Evaluation - 04/21/18 0851      Goals   Current Weight  300 lb 0.7 oz (136.1 kg)       Psychosocial: Target Goals: Acknowledge presence or absence of significant depression and/or stress, maximize coping skills, provide positive support system. Participant is able to verbalize types and ability to use techniques and skills needed for reducing stress and depression.  Initial Review & Psychosocial Screening: Initial Psych Review & Screening - 04/21/18 1020      Initial Review   Current issues with  None Identified;Current Stress Concerns    Source of Stress Concerns  Chronic Illness;Unable to perform yard/household activities;Financial      Family Dynamics   Good Support System?  Yes   husband      Barriers   Psychosocial barriers to participate in program  There are no identifiable barriers or psychosocial needs.      Screening Interventions    Interventions  Encouraged to exercise    Expected Outcomes  Short Term goal: Utilizing psychosocial counselor, staff and physician to assist with identification of specific Stressors or current issues interfering with healing process. Setting desired goal for each stressor or current issue identified.;Long Term Goal: Stressors or current issues are controlled or eliminated.;Short Term goal: Identification and review with participant  of any Quality of Life or Depression concerns found by scoring the questionnaire.;Long Term goal: The participant improves quality of Life and PHQ9 Scores as seen by post scores and/or verbalization of changes       Quality of Life Scores: Quality of Life - 04/21/18 1021      Quality of Life   Select  Quality of Life      Quality of Life Scores   Health/Function Pre  18.07 %    Socioeconomic Pre  16.56 %    Psych/Spiritual Pre  23.1 %    Family Pre  21.6 %    GLOBAL Pre  19.24 %      Scores of 19 and below usually indicate a poorer quality of life in these areas.  A difference of  2-3 points is a clinically meaningful difference.  A difference of 2-3 points in the total score of the Quality of Life Index has been associated with significant improvement in overall quality of life, self-image, physical symptoms, and general health in studies assessing change in quality of life.  PHQ-9: Recent Review Flowsheet Data    Depression screen [facility] 2/9 04/27/2018   Decreased Interest 0   Down, Depressed, Hopeless 0   PHQ - 2 Score 0     Interpretation of Total Score  Total Score Depression Severity:  1-4 = Minimal depression, 5-9 = Mild depression, 10-14 = Moderate depression, 15-19 = Moderately severe depression, 20-27 = Severe depression   Psychosocial Evaluation and Intervention: Psychosocial Evaluation - 04/27/18 0849      Psychosocial Evaluation & Interventions   Interventions  Encouraged to exercise with the program and follow exercise prescription;Stress  management education;Relaxation education    Comments  Phil[place]ena with some stress concerns related to her chronic illness.     Expected Outcomes  Nairi will report decreased stress related to her chronic illness and report feeling the ability to manage this illness.     Continue Psychosocial Services   Follow up required by staff       Psychosocial Re-Evaluation: Psychosocial Re-Evaluation    [place] Name 05/14/18 [phone]             Psychosocial Re-Evaluation   Current issues with  Current Stress Concerns       Comments  Encouraged attendance of stress management class to learn techniques.        Expected Outcomes  Taylon will report ability to manage her stress with good coping skills.        Interventions  Stress management education;Relaxation education;Encouraged to attend Cardiac Rehabilitation for the exercise       Continue Psychosocial Services   Follow up required by staff          Psychosocial Discharge (Final Psychosocial Re-Evaluation): Psychosocial Re-Evaluation - 05/14/18 0811      Psychosocial Re-Evaluation   Current issues with  Current Stress Concerns    Comments  Encouraged attendance of stress management class to learn techniques.     Expected Outcomes  Joyous will report ability to manage her stress with good coping skills.     Interventions  Stress management education;Relaxation education;Encouraged to attend Cardiac Rehabilitation for the exercise    Continue Psychosocial Services   Follow up required by staff       Vocational Rehabilitation: Provide vocational rehab assistance to qualifying candidates.   Vocational Rehab Evaluation & Intervention: Vocational Rehab - 04/27/18 0859      Vocational Rehab Re-Evaulation   Comments  Paperwork given to  Alyxis 04/27/18.       Education: Education Goals: Education classes will be provided on a weekly basis, covering required topics. Participant will state understanding/return demonstration of topics  presented.  Learning Barriers/Preferences: Learning Barriers/Preferences - 04/21/18 0837      Learning Barriers/Preferences   Learning Barriers  None    Learning Preferences  None       Education Topics: Count Your Pulse:  -Group instruction provided by verbal instruction, demonstration, patient participation and written materials to support subject.  Instructors address importance of being able to find your pulse and how to count your pulse when at home without a heart monitor.  Patients get hands on experience counting their pulse with staff help and individually.   Heart Attack, Angina, and Risk Factor Modification:  -Group instruction provided by verbal instruction, video, and written materials to support subject.  Instructors address signs and symptoms of angina and heart attacks.    Also discuss risk factors for heart disease and how to make changes to improve heart health risk factors.   Functional Fitness:  -Group instruction provided by verbal instruction, demonstration, patient participation, and written materials to support subject.  Instructors address safety measures for doing things around the house.  Discuss how to get up and down off the floor, how to pick things up properly, how to safely get out of a chair without assistance, and balance training.   Meditation and Mindfulness:  -Group instruction provided by verbal instruction, patient participation, and written materials to support subject.  Instructor addresses importance of mindfulness and meditation practice to help reduce stress and improve awareness.  Instructor also leads participants through a meditation exercise.    Stretching for Flexibility and Mobility:  -Group instruction provided by verbal instruction, patient participation, and written materials to support subject.  Instructors lead participants through series of stretches that are designed to increase flexibility thus improving mobility.  These stretches  are additional exercise for major muscle groups that are typically performed during regular warm up and cool down.   Hands Only CPR:  -Group verbal, video, and participation provides a basic overview of AHA guidelines for community CPR. Role-play of emergencies allow participants the opportunity to practice calling for help and chest compression technique with discussion of AED use.   Hypertension: -Group verbal and written instruction that provides a basic overview of hypertension including the most recent diagnostic guidelines, risk factor reduction with self-care instructions and medication management.    Nutrition I class: Heart Healthy Eating:  -Group instruction provided by PowerPoint slides, verbal discussion, and written materials to support subject matter. The instructor gives an explanation and review of the Therapeutic Lifestyle Changes diet recommendations, which includes a discussion on lipid goals, dietary fat, sodium, fiber, plant stanol/sterol esters, sugar, and the components of a well-balanced, healthy diet.   Nutrition II class: Lifestyle Skills:  -Group instruction provided by PowerPoint slides, verbal discussion, and written materials to support subject matter. The instructor gives an explanation and review of label reading, grocery shopping for heart health, heart healthy recipe modifications, and ways to make healthier choices when eating out.   Diabetes Question & Answer:  -Group instruction provided by PowerPoint slides, verbal discussion, and written materials to support subject matter. The instructor gives an explanation and review of diabetes co-morbidities, pre- and post-prandial blood glucose goals, pre-exercise blood glucose goals, signs, symptoms, and treatment of hypoglycemia and hyperglycemia, and foot care basics.   Diabetes Blitz:  -Group instruction provided by [employer], verbal discussion,  and written materials to support subject matter. The  instructor gives an explanation and review of the physiology behind type 1 and type 2 diabetes, diabetes medications and rational behind using different medications, pre- and post-prandial blood glucose recommendations and Hemoglobin A1c goals, diabetes diet, and exercise including blood glucose guidelines for exercising safely.    Portion Distortion:  -Group instruction provided by PowerPoint slides, verbal discussion, written materials, and food models to support subject matter. The instructor gives an explanation of serving size versus portion size, changes in portions sizes over the last 20 years, and what consists of a serving from each food group.   Stress Management:  -Group instruction provided by verbal instruction, video, and written materials to support subject matter.  Instructors review role of stress in heart disease and how to cope with stress positively.     Exercising on Your Own:  -Group instruction provided by verbal instruction, power point, and written materials to support subject.  Instructors discuss benefits of exercise, components of exercise, frequency and intensity of exercise, and end points for exercise.  Also discuss use of nitroglycerin and activating EMS.  Review options of places to exercise outside of rehab.  Review guidelines for sex with heart disease.   Cardiac Drugs I:  -Group instruction provided by verbal instruction and written materials to support subject.  Instructor reviews cardiac drug classes: antiplatelets, anticoagulants, beta blockers, and statins.  Instructor discusses reasons, side effects, and lifestyle considerations for each drug class.   Cardiac Drugs II:  -Group instruction provided by verbal instruction and written materials to support subject.  Instructor reviews cardiac drug classes: angiotensin converting enzyme inhibitors (ACE-I), angiotensin II receptor blockers (ARBs), nitrates, and calcium channel blockers.  Instructor discusses  reasons, side effects, and lifestyle considerations for each drug class.   Anatomy and Physiology of the Circulatory System:  Group verbal and written instruction and models provide basic cardiac anatomy and physiology, with the coronary electrical and arterial systems. Review of: AMI, Angina, Valve disease, Heart Failure, Peripheral Artery Disease, Cardiac Arrhythmia, Pacemakers, and the ICD.   Other Education:  -Group or individual verbal, written, or video instructions that support the educational goals of the cardiac rehab program.   Holiday Eating Survival Tips:  -Group instruction provided by PowerPoint slides, verbal discussion, and written materials to support subject matter. The instructor gives patients tips, tricks, and techniques to help them not only survive but enjoy the holidays despite the onslaught of food that accompanies the holidays.   Knowledge Questionnaire Score: Knowledge Questionnaire Score - 04/21/18 1023      Knowledge Questionnaire Score   Pre Score  20/24       Core Components/Risk Factors/Patient Goals at Admission: Personal Goals and Risk Factors at Admission - 04/21/18 1040      Core Components/Risk Factors/Patient Goals on Admission    Weight Management  Obesity;Yes    Intervention  Weight Management/Obesity: Establish reasonable short term and long term weight goals.;Obesity: Provide education and appropriate resources to help participant work on and attain dietary goals.    Admit Weight  300 lb 0.7 oz (136.1 kg)    Goal Weight: Short Term  294 lb (133.4 kg)    Goal Weight: Long Term  200 lb (90.7 kg)    Expected Outcomes  Short Term: Continue to assess and modify interventions until short term weight is achieved;Long Term: Adherence to nutrition and physical activity/exercise program aimed toward attainment of established weight goal;Weight Loss: Understanding of general recommendations for a balanced  deficit meal plan, which promotes 1-2 lb weight  loss per week and includes a negative energy balance of [phone] kcal/d;Understanding recommendations for meals to include 15-35% energy as protein, 25-35% energy from fat, 35-60% energy from carbohydrates, less than 200mg  of dietary cholesterol, 20-35 gm of total fiber daily;Understanding of distribution of calorie intake throughout the day with the consumption of 4-5 meals/snacks    Heart Failure  Yes    Intervention  Provide a combined exercise and nutrition program that is supplemented with education, support and counseling about heart failure. Directed toward relieving symptoms such as shortness of breath, decreased exercise tolerance, and extremity edema.    Expected Outcomes  Improve functional capacity of life;Short term: Attendance in program 2-3 days a week with increased exercise capacity. Reported lower sodium intake. Reported increased fruit and vegetable intake. Reports medication compliance.;Short term: Daily weights obtained and reported for increase. Utilizing diuretic protocols set by physician.;Long term: Adoption of self-care skills and reduction of barriers for early signs and symptoms recognition and intervention leading to self-care maintenance.    Hypertension  Yes    Intervention  Provide education on lifestyle modifcations including regular physical activity/exercise, weight management, moderate sodium restriction and increased consumption of fresh fruit, vegetables, and low fat dairy, alcohol moderation, and smoking cessation.;Monitor prescription use compliance.    Expected Outcomes  Short Term: Continued assessment and intervention until BP is < 140/7mm HG in hypertensive participants. < 130/92mm HG in hypertensive participants with diabetes, heart failure or chronic kidney disease.;Long Term: Maintenance of blood pressure at goal levels.       Core Components/Risk Factors/Patient Goals Review:  Goals and Risk Factor Review    Row Name 04/27/18 0900 05/14/18 3545            Core Components/Risk Factors/Patient Goals Review   Personal Goals Review  Weight Management/Obesity;Hypertension;Heart Failure  Weight Management/Obesity;Hypertension;Heart Failure      Review  Pt with multiple CAD RFs willing to participate in CR exercise.  Nasya would like to increase her heart strength.  Pt with multiple CAD RFs willing to participate in CR exercise.  Azizi is tolerating exercise well.  VSS.      Expected Outcomes  Pt will continue to participate in CR exercise, nutrition, and lifestyle modification opportunities.   Pt will continue to participate in CR exercise, nutrition, and lifestyle modification opportunities.          Core Components/Risk Factors/Patient Goals at Discharge (Final Review):  Goals and Risk Factor Review - 05/14/18 0812      Core Components/Risk Factors/Patient Goals Review   Personal Goals Review  Weight Management/Obesity;Hypertension;Heart Failure    Review  Pt with multiple CAD RFs willing to participate in CR exercise.  Thurma is tolerating exercise well.  VSS.    Expected Outcomes  Pt will continue to participate in CR exercise, nutrition, and lifestyle modification opportunities.        ITP Comments: ITP Comments    Row Name 04/21/18 0835 04/27/18 0849 05/14/18 0810       ITP Comments  Medical Director - Dr. Fransico Him, MD  Pt started exercise today and tolerated it well.   30 Day ITP Review.  Clairessa is tolerating exercise well.  She has had some absences d/t sickness and appointments.         Comments: See ITP Comments.

## 2018-05-15 ENCOUNTER — Encounter (HOSPITAL_COMMUNITY): Payer: PRIVATE HEALTH INSURANCE

## 2018-05-15 ENCOUNTER — Encounter (HOSPITAL_COMMUNITY)
Admission: RE | Admit: 2018-05-15 | Discharge: 2018-05-15 | Disposition: A | Discharge status: Home / Self Care | Payer: PRIVATE HEALTH INSURANCE | Source: Ambulatory Visit | Attending: Cardiology | Admitting: Cardiology

## 2018-05-15 DIAGNOSIS — I5022 Chronic systolic (congestive) heart failure: Secondary | ICD-10-CM

## 2018-05-15 DIAGNOSIS — I110 Hypertensive heart disease with heart failure: Secondary | ICD-10-CM | POA: Diagnosis not present

## 2018-05-18 ENCOUNTER — Encounter (HOSPITAL_COMMUNITY): Payer: PRIVATE HEALTH INSURANCE

## 2018-05-18 ENCOUNTER — Encounter (HOSPITAL_COMMUNITY)
Admission: RE | Admit: 2018-05-18 | Discharge: 2018-05-18 | Disposition: A | Discharge status: Home / Self Care | Payer: PRIVATE HEALTH INSURANCE | Source: Ambulatory Visit | Attending: Cardiology | Admitting: Cardiology

## 2018-05-18 DIAGNOSIS — I110 Hypertensive heart disease with heart failure: Secondary | ICD-10-CM | POA: Diagnosis not present

## 2018-05-18 DIAGNOSIS — I5022 Chronic systolic (congestive) heart failure: Secondary | ICD-10-CM

## 2018-05-20 ENCOUNTER — Encounter (HOSPITAL_COMMUNITY): Payer: PRIVATE HEALTH INSURANCE

## 2018-05-20 ENCOUNTER — Encounter (HOSPITAL_COMMUNITY)
Admission: RE | Admit: 2018-05-20 | Discharge: 2018-05-20 | Disposition: A | Discharge status: Home / Self Care | Payer: PRIVATE HEALTH INSURANCE | Source: Ambulatory Visit | Attending: Cardiology | Admitting: Cardiology

## 2018-05-20 DIAGNOSIS — I5022 Chronic systolic (congestive) heart failure: Secondary | ICD-10-CM

## 2018-05-20 DIAGNOSIS — I110 Hypertensive heart disease with heart failure: Secondary | ICD-10-CM | POA: Diagnosis not present

## 2018-05-22 ENCOUNTER — Encounter (HOSPITAL_COMMUNITY): Payer: PRIVATE HEALTH INSURANCE

## 2018-05-22 ENCOUNTER — Encounter (HOSPITAL_COMMUNITY)
Admission: RE | Admit: 2018-05-22 | Discharge: 2018-05-22 | Disposition: A | Discharge status: Home / Self Care | Payer: PRIVATE HEALTH INSURANCE | Source: Ambulatory Visit | Attending: Cardiology | Admitting: Cardiology

## 2018-05-22 DIAGNOSIS — I110 Hypertensive heart disease with heart failure: Secondary | ICD-10-CM | POA: Diagnosis not present

## 2018-05-22 DIAGNOSIS — I5022 Chronic systolic (congestive) heart failure: Secondary | ICD-10-CM

## 2018-05-25 ENCOUNTER — Encounter (HOSPITAL_COMMUNITY): Payer: PRIVATE HEALTH INSURANCE

## 2018-05-25 NOTE — Progress Notes (Signed)
I have reviewed a Home Exercise Prescription with Amanda [name] . Amanda [name] is currently exercising at home. The patient was advised to continue to walk 2-3 days a week for 5 minutes.  Amanda [name] and I discussed how to progress their exercise prescription in order to reach a total of 30 minutes.The patient stated that they understand the exercise prescription. We reviewed exercise guidelines, target heart rate during exercise, RPE Scale, weather conditions, NTG use, endpoints for exercise, warmup and cool down. Patient is encouraged to come to me with any questions. I will continue to follow up with the patient to assist them with progression and safety.    Amanda Lair MS, ACSM CEP 4:31 PM  05/22/2018

## 2018-05-26 ENCOUNTER — Ambulatory Visit (HOSPITAL_COMMUNITY)
Admission: RE | Admit: 2018-05-26 | Discharge: 2018-05-26 | Disposition: A | Discharge status: Home / Self Care | Payer: PRIVATE HEALTH INSURANCE | Source: Ambulatory Visit | Attending: Cardiology | Admitting: Cardiology

## 2018-05-26 VITALS — BP 120/80 | HR 77 | Wt 300.0 lb

## 2018-05-26 DIAGNOSIS — R0683 Snoring: Secondary | ICD-10-CM | POA: Insufficient documentation

## 2018-05-26 DIAGNOSIS — E669 Obesity, unspecified: Secondary | ICD-10-CM | POA: Insufficient documentation

## 2018-05-26 DIAGNOSIS — I5022 Chronic systolic (congestive) heart failure: Secondary | ICD-10-CM | POA: Insufficient documentation

## 2018-05-26 DIAGNOSIS — G35 Multiple sclerosis: Secondary | ICD-10-CM | POA: Insufficient documentation

## 2018-05-26 DIAGNOSIS — Z6841 Body Mass Index (BMI) 40.0 and over, adult: Secondary | ICD-10-CM | POA: Diagnosis not present

## 2018-05-26 LAB — BASIC METABOLIC PANEL
Anion gap: 4 — ABNORMAL LOW (ref 5–15)
BUN: 9 mg/dL (ref 6–20)
CO2: 24 mmol/L (ref 22–32)
Calcium: 8.9 mg/dL (ref 8.9–10.3)
Chloride: 112 mmol/L — ABNORMAL HIGH (ref 98–111)
Creatinine, Ser: 0.8 mg/dL (ref 0.44–1.00)
GFR calc Af Amer: >60 mL/min (ref >60)
GFR calc non Af Amer: >60 mL/min (ref >60)
Glucose, Bld: 102 mg/dL — ABNORMAL HIGH (ref 70–99)
Potassium: 3.4 mmol/L — ABNORMAL LOW (ref 3.5–5.1)
Sodium: 140 mmol/L (ref 135–145)

## 2018-05-26 MED ORDER — CARVEDILOL 25 MG PO TABS: 25.0000 mg | ORAL_TABLET | Freq: Two times a day (BID) | ORAL | 5 refills | Status: DC

## 2018-05-26 NOTE — Progress Notes (Signed)
HF MD: [facility]  HPI:  Amanda L Broweris a 37 y.o.femalewith a history of multiple sclerosis, anemia due to uterine fibroids, and systolic HF (MC9/4709).  Admitted to [facility] 5/31-01/13/18 with volume overload and found to have acute systolic heart failure. Echo showed EF 10-15%. HF team consulted. R/LHC completed, which showed no CAD and preserved CO. Cardiac MRI showed no LGE, EF 14%. She was diuresed with IV lasix and transitioned to lasix 40 mg daily. HF meds were optimized. DC weight: 299 lbs.  Today she returns for pharmacist-led HF medication titration.At last HF clinic visit on 9/24, her Amanda [name] was increased to 97-103 mg BID and furosemide was decreased to 20 mg daily. She has been doing well since last visit. Weight stable. No dyspnea walking on flat ground. 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 She reports compliance with her medications and she did take them this am.   Shortness of breath/dyspnea on exertion? no   Orthopnea/PND? no  Edema? no  Lightheadedness/dizziness? Yes - just once this past Sunday upon standing too quickly  Daily weights at home? Yes - stable ~298 lb  Blood pressure/heart rate monitoring at home? no  Following low-sodium/fluid-restricted diet? yes  HF Medications: Carvedilol 12.5 mg PO BID Furosemide 20 mg PO daily Corlanor 2.5 mg PO BID KCl 20 mEq PO daily Entresto 97-103 mg PO BID Spironolactone 25 mg PO daily  Has the patient been experiencing any side effects to the medications prescribed?  No  Does the patient have any problems obtaining medications due to transportation or finances?   No - [employer] insurance  Understanding of regimen: good Understanding of indications: good Potential of compliance: good Patient understands to avoid NSAIDs. Patient understands to avoid  decongestants.   Pertinent Lab Values: 05/26/2018: Serum creatinine 0.8, BUN 9, Potassium 3.4, Sodium 140  Vital Signs:  Weight: 300 lb (dry weight: 300 lb)  Blood pressure: 120/80 mmHg   Heart rate: 77 bpm   Assessment: 1. Chronicsystolic CHF (EF 62-83%>>66%), due to NICM (?viral). NYHA class IIsymptoms. - Volume status stable - Increase carvedilol to goal 25 mg BID and d/c Corlanor (asked her to keep her Corlanor in case increase in carvedilol does not maintain HR at goal)  - Continue Entresto 97-103 mg BID, furosemide 20 mg daily, KCl 20 meq daily, and spironolactone 25 mg daily - Also warned her about adverse CV effects of ibuprofen and recommended APAP instead for minor aches/pains - Discussed eventual addition of Bidil for its morbidity/mortality benefits in African American patients - EF has improved some on most recent echo. Repeat echo in 3 more months. If EF >35%, can avoid ICD. Narrow QRS so not CRT candidate.  - Basic disease state pathophysiology, medication indication, mechanism and side effects reviewed at length with patient and she verbalized understanding 2. History of uterine fibroids with bleeding/anemia:Stable currently. 3. Multiple sclerosis: Quiescent currently. Aubagio, her MS medication, does not appear to have CHF as a side effect. Only cardiac side effect appears to be HTN. 4. Snores:Set up for sleep study 5. Obesity: We discussed weight loss with diet and exercise. Body mass index is 48.16 kg/m.  Plan: 1) Medication changes: Based on clinical presentation, vital signs and recent labs will increase carvedilol to goal 25 mg BID and d/c Corlanor  2) Labs: BMET today 3) Follow-up: Dr. [name] on 11/5  Amanda [name]. Amanda [name], PharmD, BCPS, [place] Clinical Pharmacist Phone: [phone]

## 2018-05-26 NOTE — Patient Instructions (Addendum)
It was great to see you today!  Please STOP Corlanor and INCREASE carvedilol to 25 mg (1 tablet) TWICE DAILY. With your current carvedilol 12.5 mg tablets, you may take #2 tablets TWICE DAILY until you pick up the higher strength.   Blood work today. We will call you with any changes.   Please keep your appointment with Dr. Aundra Dubin on 06/16/18.

## 2018-05-27 ENCOUNTER — Encounter (HOSPITAL_COMMUNITY): Payer: PRIVATE HEALTH INSURANCE

## 2018-05-28 ENCOUNTER — Telehealth (HOSPITAL_COMMUNITY): Payer: Self-pay | Admitting: Surgery

## 2018-05-28 ENCOUNTER — Telehealth (HOSPITAL_COMMUNITY): Payer: Self-pay | Admitting: *Deleted

## 2018-05-28 MED ORDER — POTASSIUM CHLORIDE CRYS ER 20 MEQ PO TBCR: 40.0000 meq | EXTENDED_RELEASE_TABLET | Freq: Every day | ORAL | 6 refills | Status: DC

## 2018-05-28 NOTE — Telephone Encounter (Signed)
Received messaged from patient regarding absences on 10/14 & 10/16 with request to call back.  Pt stated "she wasn't feeling well."  Pt called back.  No answer and VM left.

## 2018-05-28 NOTE — Telephone Encounter (Signed)
Patient called and instructed to increase Potassium to 40 meq daily per Dr. [name] after recent labwork.

## 2018-05-29 ENCOUNTER — Encounter (HOSPITAL_COMMUNITY): Payer: PRIVATE HEALTH INSURANCE

## 2018-06-01 ENCOUNTER — Encounter (HOSPITAL_COMMUNITY): Payer: PRIVATE HEALTH INSURANCE

## 2018-06-01 ENCOUNTER — Encounter (HOSPITAL_COMMUNITY)
Admission: RE | Admit: 2018-06-01 | Discharge: 2018-06-01 | Disposition: A | Discharge status: Home / Self Care | Payer: PRIVATE HEALTH INSURANCE | Source: Ambulatory Visit | Attending: Cardiology | Admitting: Cardiology

## 2018-06-01 DIAGNOSIS — I5022 Chronic systolic (congestive) heart failure: Secondary | ICD-10-CM

## 2018-06-01 DIAGNOSIS — I110 Hypertensive heart disease with heart failure: Secondary | ICD-10-CM | POA: Diagnosis not present

## 2018-06-03 ENCOUNTER — Encounter (HOSPITAL_COMMUNITY): Payer: PRIVATE HEALTH INSURANCE

## 2018-06-03 ENCOUNTER — Encounter (HOSPITAL_COMMUNITY)
Admission: RE | Admit: 2018-06-03 | Discharge: 2018-06-03 | Disposition: A | Discharge status: Home / Self Care | Payer: PRIVATE HEALTH INSURANCE | Source: Ambulatory Visit | Attending: Cardiology | Admitting: Cardiology

## 2018-06-03 DIAGNOSIS — I110 Hypertensive heart disease with heart failure: Secondary | ICD-10-CM | POA: Diagnosis not present

## 2018-06-03 DIAGNOSIS — I5022 Chronic systolic (congestive) heart failure: Secondary | ICD-10-CM

## 2018-06-04 ENCOUNTER — Encounter (HOSPITAL_COMMUNITY): Payer: Self-pay

## 2018-06-05 ENCOUNTER — Encounter (HOSPITAL_COMMUNITY): Payer: PRIVATE HEALTH INSURANCE

## 2018-06-05 ENCOUNTER — Encounter (HOSPITAL_COMMUNITY)
Admission: RE | Admit: 2018-06-05 | Discharge: 2018-06-05 | Disposition: A | Discharge status: Home / Self Care | Payer: PRIVATE HEALTH INSURANCE | Source: Ambulatory Visit | Attending: Cardiology | Admitting: Cardiology

## 2018-06-05 DIAGNOSIS — I5022 Chronic systolic (congestive) heart failure: Secondary | ICD-10-CM

## 2018-06-05 DIAGNOSIS — I110 Hypertensive heart disease with heart failure: Secondary | ICD-10-CM | POA: Diagnosis not present

## 2018-06-08 ENCOUNTER — Encounter (HOSPITAL_COMMUNITY): Payer: PRIVATE HEALTH INSURANCE

## 2018-06-10 ENCOUNTER — Encounter (HOSPITAL_COMMUNITY)
Admission: RE | Admit: 2018-06-10 | Discharge: 2018-06-10 | Disposition: A | Discharge status: Home / Self Care | Payer: PRIVATE HEALTH INSURANCE | Source: Ambulatory Visit | Attending: Cardiology | Admitting: Cardiology

## 2018-06-10 ENCOUNTER — Encounter (HOSPITAL_COMMUNITY): Payer: PRIVATE HEALTH INSURANCE

## 2018-06-10 DIAGNOSIS — I5022 Chronic systolic (congestive) heart failure: Secondary | ICD-10-CM

## 2018-06-10 DIAGNOSIS — I110 Hypertensive heart disease with heart failure: Secondary | ICD-10-CM | POA: Diagnosis not present

## 2018-06-11 NOTE — Progress Notes (Signed)
Cardiac Individual Treatment Plan  Patient Details  Name: Amanda [name] MRN: [id number] Date of Birth: 03/27/1981 Referring Provider:     CARDIAC REHAB PHASE II ORIENTATION from 04/21/2018 in [facility]  Referring Provider  Loralie Champagne, MD      Initial Encounter Date:    CARDIAC REHAB PHASE II ORIENTATION from 04/21/2018 in [place] [facility]  Date  04/21/18      Visit Diagnosis: Heart failure, chronic systolic (HCC)  Patient's Home Medications on Admission:  Current Outpatient Medications:  .  carvedilol (COREG) 25 MG tablet, Take 1 tablet (25 mg total) by mouth 2 (two) times daily with a meal., Disp: 60 tablet, Rfl: 5 .  ferrous sulfate 325 (65 FE) MG tablet, Take 1 tablet (325 mg total) by mouth daily with breakfast., Disp: 30 tablet, Rfl: 3 .  furosemide (LASIX) 40 MG tablet, Take 0.5 tablets (20 mg total) by mouth daily., Disp: 15 tablet, Rfl: 5 .  ibuprofen (ADVIL,MOTRIN) 200 MG tablet, Take 200 mg by mouth every 6 (six) hours as needed for headache., Disp: , Rfl:  .  potassium chloride SA (K-DUR,KLOR-CON) 20 MEQ tablet, Take 2 tablets (40 mEq total) by mouth daily., Disp: 30 tablet, Rfl: 6 .  sacubitril-valsartan (ENTRESTO) 97-103 MG, Take 1 tablet by mouth 2 (two) times daily., Disp: 60 tablet, Rfl: 5 .  spironolactone (ALDACTONE) 25 MG tablet, Take 1 tablet (25 mg total) by mouth daily., Disp: 30 tablet, Rfl: 6 .  Teriflunomide (AUBAGIO) 14 MG TABS, Take 14 mg by mouth daily., Disp: , Rfl:   Past Medical History: Past Medical History:  Diagnosis Date  . Anemia   . BP (high blood pressure)   . CHF (congestive heart failure) ([place])   . History of blood transfusion 08/2017   "related to low blood count"   . Multiple sclerosis ([place])   . Pneumonia 12/2017    Tobacco Use: Social History   Tobacco Use  Smoking Status Never Smoker  Smokeless Tobacco Never Used    Labs: Recent Review Scientist, physiological    Labs  for ITP Cardiac and Pulmonary Rehab Latest Ref Rng & Units 01/12/2018 01/12/2018   HCO3 20.0 - 28.0 mmol/L 25.4 25.2   TCO2 22 - 32 mmol/L 27 26   O2SAT % 67.0 64.0      Capillary Blood Glucose: No results found for: GLUCAP   Exercise Target Goals: Exercise Program Goal: Individual exercise prescription set using results from initial 6 min walk test and THRR while considering  patient's activity barriers and safety.   Exercise Prescription Goal: Initial exercise prescription builds to 30-45 minutes a day of aerobic activity, 2-3 days per week.  Home exercise guidelines will be given to patient during program as part of exercise prescription that the participant will acknowledge.  Activity Barriers & Risk Stratification: Activity Barriers & Cardiac Risk Stratification - 04/21/18 0838      Activity Barriers & Cardiac Risk Stratification   Activity Barriers  Balance Concerns   Balance concerns secondary to multiple sclerosis   Cardiac Risk Stratification  High       6 Minute Walk: 6 Minute Walk    Row Name 04/21/18 0822         6 Minute Walk   Phase  Initial     Distance  1340 feet     Walk Time  6 minutes     # of Rest Breaks  0     MPH  2.54     METS  3.94     RPE  11     Perceived Dyspnea   3     VO2 Peak  13.78     Symptoms  Yes (comment)     Comments  Dyspnea     Resting HR  95 bpm     Resting BP  98/60     Resting Oxygen Saturation   98 %     Exercise Oxygen Saturation  during 6 min walk  98 %     Max Ex. HR  130 bpm     Max Ex. BP  120/70     2 Minute Post BP  108/60        Oxygen Initial Assessment:   Oxygen Re-Evaluation:   Oxygen Discharge (Final Oxygen Re-Evaluation):   Initial Exercise Prescription: Initial Exercise Prescription - 04/21/18 1000      Date of Initial Exercise RX and Referring Provider   Date  04/21/18    Referring Provider  Loralie Champagne, MD      Treadmill   MPH  1.7    Grade  0    Minutes  10    METs  2.3       Recumbant Bike   Level  2    Watts  25    Minutes  10    METs  2.58      NuStep   Level  2    SPM  85    Minutes  10    METs  2.5      Track   Laps  10    Minutes  10    METs  2.74      Prescription Details   Frequency (times per week)  3    Duration  Progress to 30 minutes of continuous aerobic without signs/symptoms of physical distress      Intensity   THRR 40-80% of Max Heartrate  74-148    Ratings of Perceived Exertion  11-13    Perceived Dyspnea  0-4      Progression   Progression  Continue to progress workloads to maintain intensity without signs/symptoms of physical distress.      Resistance Training   Training Prescription  Yes    Weight  3lbs       Perform Capillary Blood Glucose checks as needed.  Exercise Prescription Changes: Exercise Prescription Changes    Row Name 04/27/18 0800 05/13/18 1600 05/22/18 1624 06/05/18 0846       Response to Exercise   Blood Pressure (Admit)  120/70  110/62  120/78  108/60    Blood Pressure (Exercise)  118/60  122/64  118/78  134/74    Blood Pressure (Exit)  101/70  110/58  104/60  108/60    Heart Rate (Admit)  99 bpm  99 bpm  94 bpm  100 bpm    Heart Rate (Exercise)  129 bpm  134 bpm  123 bpm  136 bpm    Heart Rate (Exit)  87 bpm  83 bpm  91 bpm  92 bpm    Rating of Perceived Exertion (Exercise)  12  12  12  12     Perceived Dyspnea (Exercise)  0  0  0  0    Symptoms  None   None  None  None    Comments  Pt oriented to exercise equipment   -  None  None    Duration  Progress to 30 minutes  of  aerobic without signs/symptoms of physical distress  Continue with 30 min of aerobic exercise without signs/symptoms of physical distress.  Continue with 30 min of aerobic exercise without signs/symptoms of physical distress.  Progress to 45 minutes of aerobic exercise without signs/symptoms of physical distress    Intensity  THRR New  THRR unchanged  THRR unchanged  THRR unchanged      Progression   Progression  Continue to  progress workloads to maintain intensity without signs/symptoms of physical distress.  Continue to progress workloads to maintain intensity without signs/symptoms of physical distress.  Continue to progress workloads to maintain intensity without signs/symptoms of physical distress.  Continue to progress workloads to maintain intensity without signs/symptoms of physical distress.    Average METs  2.2  2.08  2.7  2.9      Resistance Training   Training Prescription  Yes  No  Yes  Yes    Weight  3lbs  -  4lbs  4lbs    Reps  10-15  -  10-15  10-15    Time  10 Minutes  -  10 Minutes  10 Minutes      Interval Training   Interval Training  -  No  No  No      Treadmill   MPH  1.9  1.9  2.1  2.1    Grade  0  0  1  1    Minutes  10  10  10  10     METs  2.45  2.45  2.9  2.9      Recumbant Bike   Level  2  2  3  3     Watts  25  20  20  20     Minutes  10  10  10  10     METs  2.58  1.7  1.7  1.7      NuStep   Level  2  3  3  5     SPM  85  85  85  95    Minutes  10  10  10  10     METs  1.9  2.1  3.5  4.1      Home Exercise Plan   Plans to continue exercise at  -  -  Home (comment) Walking  Home (comment) Walking    Frequency  -  -  Add 2 additional days to program exercise sessions.  Add 2 additional days to program exercise sessions.    Initial Home Exercises Provided  -  -  05/18/18  05/18/18       Exercise Comments: Exercise Comments    Row Name 04/27/18 0810 05/22/18 1628 05/25/18 1627       Exercise Comments  Pt oriented exercise equipment. Pt tolerated workloads well. Will continue to monitor and progress pt as tolerated.   Reviewed HEP with pt. Will continue to monitor and progress pt as tolerated.   -        Exercise Goals and Review: Exercise Goals    Row Name 04/21/18 0902             Exercise Goals   Increase Physical Activity  Yes       Intervention  Provide advice, education, support and counseling about physical activity/exercise needs.;Develop an  individualized exercise prescription for aerobic and resistive training based on initial evaluation findings, risk stratification, comorbidities and participant's personal goals.       Expected Outcomes  Short Term:  Attend rehab on a regular basis to increase amount of physical activity.;Long Term: Exercising regularly at least 3-5 days a week.;Long Term: Add in home exercise to make exercise part of routine and to increase amount of physical activity.       Increase Strength and Stamina  Yes       Intervention  Provide advice, education, support and counseling about physical activity/exercise needs.;Develop an individualized exercise prescription for aerobic and resistive training based on initial evaluation findings, risk stratification, comorbidities and participant's personal goals.       Expected Outcomes  Short Term: Increase workloads from initial exercise prescription for resistance, speed, and METs.;Short Term: Perform resistance training exercises routinely during rehab and add in resistance training at home;Long Term: Improve cardiorespiratory fitness, muscular endurance and strength as measured by increased METs and functional capacity (6MWT)       Able to understand and use rate of perceived exertion (RPE) scale  Yes       Intervention  Provide education and explanation on how to use RPE scale       Expected Outcomes  Short Term: Able to use RPE daily in rehab to express subjective intensity level;Long Term:  Able to use RPE to guide intensity level when exercising independently       Knowledge and understanding of Target Heart Rate Range (THRR)  Yes       Intervention  Provide education and explanation of THRR including how the numbers were predicted and where they are located for reference       Expected Outcomes  Short Term: Able to state/look up THRR;Long Term: Able to use THRR to govern intensity when exercising independently;Short Term: Able to use daily as guideline for intensity in  rehab       Able to check pulse independently  Yes       Intervention  Provide education and demonstration on how to check pulse in carotid and radial arteries.;Review the importance of being able to check your own pulse for safety during independent exercise       Expected Outcomes  Short Term: Able to explain why pulse checking is important during independent exercise;Long Term: Able to check pulse independently and accurately       Understanding of Exercise Prescription  Yes       Intervention  Provide education, explanation, and written materials on patient's individual exercise prescription       Expected Outcomes  Short Term: Able to explain program exercise prescription;Long Term: Able to explain home exercise prescription to exercise independently          Exercise Goals Re-Evaluation : Exercise Goals Re-Evaluation    Row Name 05/22/18 1629             Exercise Goal Re-Evaluation   Exercise Goals Review  Increase Physical Activity;Able to understand and use rate of perceived exertion (RPE) scale;Knowledge and understanding of Target Heart Rate Range (THRR);Understanding of Exercise Prescription;Increase Strength and Stamina;Able to check pulse independently       Comments  Reviewed HEP with pt. Also reviewed THRR, RPE Scale, weather conditions, NTG use, endpoints of exercise, warmup and cool down.        Expected Outcomes  Pt will plan to continue to walk 2-3 days a week for 5 minutes. Pt will gradually increase walking time till she gets 30 minutes. Pt will continue to increase cardiovascular fitness. Will continue to monitor pt.           Discharge Exercise  Prescription (Final Exercise Prescription Changes): Exercise Prescription Changes - 06/05/18 0846      Response to Exercise   Blood Pressure (Admit)  108/60    Blood Pressure (Exercise)  134/74    Blood Pressure (Exit)  108/60    Heart Rate (Admit)  100 bpm    Heart Rate (Exercise)  136 bpm    Heart Rate (Exit)  92 bpm     Rating of Perceived Exertion (Exercise)  12    Perceived Dyspnea (Exercise)  0    Symptoms  None    Comments  None    Duration  Progress to 45 minutes of aerobic exercise without signs/symptoms of physical distress    Intensity  THRR unchanged      Progression   Progression  Continue to progress workloads to maintain intensity without signs/symptoms of physical distress.    Average METs  2.9      Resistance Training   Training Prescription  Yes    Weight  4lbs    Reps  10-15    Time  10 Minutes      Interval Training   Interval Training  No      Treadmill   MPH  2.1    Grade  1    Minutes  10    METs  2.9      Recumbant Bike   Level  3    Watts  20    Minutes  10    METs  1.7      NuStep   Level  5    SPM  95    Minutes  10    METs  4.1      Home Exercise Plan   Plans to continue exercise at  Home (comment)   Walking   Frequency  Add 2 additional days to program exercise sessions.    Initial Home Exercises Provided  05/18/18       Nutrition:  Target Goals: Understanding of nutrition guidelines, daily intake of sodium 1500mg , cholesterol 200mg , calories 30% from fat and 7% or less from saturated fats, daily to have 5 or more servings of fruits and vegetables.  Biometrics: Pre Biometrics - 04/21/18 1029      Pre Biometrics   Height  5\' 7"  (1.702 m)    Weight  (!) 136.1 kg    Waist Circumference  42 inches    Hip Circumference  56.25 inches    Waist to Hip Ratio  0.75 %    BMI (Calculated)  46.98    Triceps Skinfold  49 mm    % Body Fat  51.6 %    Grip Strength  31.5 kg    Flexibility  0 in    Single Leg Stand  3.41 seconds        Nutrition Therapy Plan and Nutrition Goals: Nutrition Therapy & Goals - 04/21/18 0851      Nutrition Therapy   Diet  heart healthy      Personal Nutrition Goals   Nutrition Goal  Pt to identify and limit food sources of saturated fat, trans fat, refined carbohydrates and sodium    Personal Goal #2  Pt to identify  food quantities necessary to achieve weight loss of 6-24 lbs. at graduation from cardiac rehab      Elmer, educate and counsel regarding individualized specific dietary modifications aiming towards targeted core components such as weight, hypertension, lipid management, diabetes, heart failure and other comorbidities.  Expected Outcomes  Short Term Goal: Understand basic principles of dietary content, such as calories, fat, sodium, cholesterol and nutrients.;Long Term Goal: Adherence to prescribed nutrition plan.       Nutrition Assessments: Nutrition Assessments - 04/21/18 0852      MEDFICTS Scores   Pre Score  62       Nutrition Goals Re-Evaluation: Nutrition Goals Re-Evaluation    [place] Name 04/21/18 0851             Goals   Current Weight  300 lb 0.7 oz (136.1 kg)          Nutrition Goals Re-Evaluation: Nutrition Goals Re-Evaluation    [place] Name 04/21/18 0851             Goals   Current Weight  300 lb 0.7 oz (136.1 kg)          Nutrition Goals Discharge (Final Nutrition Goals Re-Evaluation): Nutrition Goals Re-Evaluation - 04/21/18 0851      Goals   Current Weight  300 lb 0.7 oz (136.1 kg)       Psychosocial: Target Goals: Acknowledge presence or absence of significant depression and/or stress, maximize coping skills, provide positive support system. Participant is able to verbalize types and ability to use techniques and skills needed for reducing stress and depression.  Initial Review & Psychosocial Screening: Initial Psych Review & Screening - 04/21/18 1020      Initial Review   Current issues with  None Identified;Current Stress Concerns    Source of Stress Concerns  Chronic Illness;Unable to perform yard/household activities;Financial      Family Dynamics   Good Support System?  Yes   husband      Barriers   Psychosocial barriers to participate in program  There are no identifiable barriers or psychosocial  needs.      Screening Interventions   Interventions  Encouraged to exercise    Expected Outcomes  Short Term goal: Utilizing psychosocial counselor, staff and physician to assist with identification of specific Stressors or current issues interfering with healing process. Setting desired goal for each stressor or current issue identified.;Long Term Goal: Stressors or current issues are controlled or eliminated.;Short Term goal: Identification and review with participant of any Quality of Life or Depression concerns found by scoring the questionnaire.;Long Term goal: The participant improves quality of Life and PHQ9 Scores as seen by post scores and/or verbalization of changes       Quality of Life Scores: Quality of Life - 04/21/18 1021      Quality of Life   Select  Quality of Life      Quality of Life Scores   Health/Function Pre  18.07 %    Socioeconomic Pre  16.56 %    Psych/Spiritual Pre  23.1 %    Family Pre  21.6 %    GLOBAL Pre  19.24 %      Scores of 19 and below usually indicate a poorer quality of life in these areas.  A difference of  2-3 points is a clinically meaningful difference.  A difference of 2-3 points in the total score of the Quality of Life Index has been associated with significant improvement in overall quality of life, self-image, physical symptoms, and general health in studies assessing change in quality of life.  PHQ-9: Recent Review Flowsheet Data    Depression screen [facility] 2/9 04/27/2018   Decreased Interest 0   Down, Depressed, Hopeless 0   PHQ - 2 Score 0  Interpretation of Total Score  Total Score Depression Severity:  1-4 = Minimal depression, 5-9 = Mild depression, 10-14 = Moderate depression, 15-19 = Moderately severe depression, 20-27 = Severe depression   Psychosocial Evaluation and Intervention: Psychosocial Evaluation - 04/27/18 0849      Psychosocial Evaluation & Interventions   Interventions  Encouraged to exercise with the program  and follow exercise prescription;Stress management education;Relaxation education    Comments  Jamaiya with some stress concerns related to her chronic illness.     Expected Outcomes  Helon will report decreased stress related to her chronic illness and report feeling the ability to manage this illness.     Continue Psychosocial Services   Follow up required by staff       Psychosocial Re-Evaluation: Psychosocial Re-Evaluation    [place] Name 05/14/18 0811 06/04/18 1641           Psychosocial Re-Evaluation   Current issues with  Current Stress Concerns  Current Stress Concerns      Comments  Encouraged attendance of stress management class to learn techniques.   Encouraged attendance of stress management class to learn techniques.       Expected Outcomes  Pilar will report ability to manage her stress with good coping skills.   Jaslyne will report ability to manage her stress with good coping skills.       Interventions  Stress management education;Relaxation education;Encouraged to attend Cardiac Rehabilitation for the exercise  Stress management education;Relaxation education;Encouraged to attend Cardiac Rehabilitation for the exercise      Continue Psychosocial Services   Follow up required by staff  Follow up required by staff         Psychosocial Discharge (Final Psychosocial Re-Evaluation): Psychosocial Re-Evaluation - 06/04/18 1641      Psychosocial Re-Evaluation   Current issues with  Current Stress Concerns    Comments  Encouraged attendance of stress management class to learn techniques.     Expected Outcomes  Kampbell will report ability to manage her stress with good coping skills.     Interventions  Stress management education;Relaxation education;Encouraged to attend Cardiac Rehabilitation for the exercise    Continue Psychosocial Services   Follow up required by staff       Vocational Rehabilitation: Provide vocational rehab assistance to qualifying candidates.   Vocational  Rehab Evaluation & Intervention: Vocational Rehab - 04/27/18 0859      Vocational Rehab Re-Evaulation   Comments  Paperwork given to Alayne 04/27/18.       Education: Education Goals: Education classes will be provided on a weekly basis, covering required topics. Participant will state understanding/return demonstration of topics presented.  Learning Barriers/Preferences: Learning Barriers/Preferences - 04/21/18 0837      Learning Barriers/Preferences   Learning Barriers  None    Learning Preferences  None       Education Topics: Count Your Pulse:  -Group instruction provided by verbal instruction, demonstration, patient participation and written materials to support subject.  Instructors address importance of being able to find your pulse and how to count your pulse when at home without a heart monitor.  Patients get hands on experience counting their pulse with staff help and individually.   Heart Attack, Angina, and Risk Factor Modification:  -Group instruction provided by verbal instruction, video, and written materials to support subject.  Instructors address signs and symptoms of angina and heart attacks.    Also discuss risk factors for heart disease and how to make changes to improve heart health risk  factors.   Functional Fitness:  -Group instruction provided by verbal instruction, demonstration, patient participation, and written materials to support subject.  Instructors address safety measures for doing things around the house.  Discuss how to get up and down off the floor, how to pick things up properly, how to safely get out of a chair without assistance, and balance training.   Meditation and Mindfulness:  -Group instruction provided by verbal instruction, patient participation, and written materials to support subject.  Instructor addresses importance of mindfulness and meditation practice to help reduce stress and improve awareness.  Instructor also leads participants  through a meditation exercise.    Stretching for Flexibility and Mobility:  -Group instruction provided by verbal instruction, patient participation, and written materials to support subject.  Instructors lead participants through series of stretches that are designed to increase flexibility thus improving mobility.  These stretches are additional exercise for major muscle groups that are typically performed during regular warm up and cool down.   Hands Only CPR:  -Group verbal, video, and participation provides a basic overview of AHA guidelines for community CPR. Role-play of emergencies allow participants the opportunity to practice calling for help and chest compression technique with discussion of AED use.   Hypertension: -Group verbal and written instruction that provides a basic overview of hypertension including the most recent diagnostic guidelines, risk factor reduction with self-care instructions and medication management.    Nutrition I class: Heart Healthy Eating:  -Group instruction provided by PowerPoint slides, verbal discussion, and written materials to support subject matter. The instructor gives an explanation and review of the Therapeutic Lifestyle Changes diet recommendations, which includes a discussion on lipid goals, dietary fat, sodium, fiber, plant stanol/sterol esters, sugar, and the components of a well-balanced, healthy diet.   Nutrition II class: Lifestyle Skills:  -Group instruction provided by PowerPoint slides, verbal discussion, and written materials to support subject matter. The instructor gives an explanation and review of label reading, grocery shopping for heart health, heart healthy recipe modifications, and ways to make healthier choices when eating out.   Diabetes Question & Answer:  -Group instruction provided by PowerPoint slides, verbal discussion, and written materials to support subject matter. The instructor gives an explanation and review of  diabetes co-morbidities, pre- and post-prandial blood glucose goals, pre-exercise blood glucose goals, signs, symptoms, and treatment of hypoglycemia and hyperglycemia, and foot care basics.   Diabetes Blitz:  -Group instruction provided by PowerPoint slides, verbal discussion, and written materials to support subject matter. The instructor gives an explanation and review of the physiology behind type 1 and type 2 diabetes, diabetes medications and rational behind using different medications, pre- and post-prandial blood glucose recommendations and Hemoglobin A1c goals, diabetes diet, and exercise including blood glucose guidelines for exercising safely.    Portion Distortion:  -Group instruction provided by PowerPoint slides, verbal discussion, written materials, and food models to support subject matter. The instructor gives an explanation of serving size versus portion size, changes in portions sizes over the last 20 years, and what consists of a serving from each food group.   Stress Management:  -Group instruction provided by verbal instruction, video, and written materials to support subject matter.  Instructors review role of stress in heart disease and how to cope with stress positively.     Exercising on Your Own:  -Group instruction provided by verbal instruction, power point, and written materials to support subject.  Instructors discuss benefits of exercise, components of exercise, frequency and intensity of exercise,  and end points for exercise.  Also discuss use of nitroglycerin and activating EMS.  Review options of places to exercise outside of rehab.  Review guidelines for sex with heart disease.   Cardiac Drugs I:  -Group instruction provided by verbal instruction and written materials to support subject.  Instructor reviews cardiac drug classes: antiplatelets, anticoagulants, beta blockers, and statins.  Instructor discusses reasons, side effects, and lifestyle considerations  for each drug class.   Cardiac Drugs II:  -Group instruction provided by verbal instruction and written materials to support subject.  Instructor reviews cardiac drug classes: angiotensin converting enzyme inhibitors (ACE-I), angiotensin II receptor blockers (ARBs), nitrates, and calcium channel blockers.  Instructor discusses reasons, side effects, and lifestyle considerations for each drug class.   Anatomy and Physiology of the Circulatory System:  Group verbal and written instruction and models provide basic cardiac anatomy and physiology, with the coronary electrical and arterial systems. Review of: AMI, Angina, Valve disease, Heart Failure, Peripheral Artery Disease, Cardiac Arrhythmia, Pacemakers, and the ICD.   Other Education:  -Group or individual verbal, written, or video instructions that support the educational goals of the cardiac rehab program.   Holiday Eating Survival Tips:  -Group instruction provided by PowerPoint slides, verbal discussion, and written materials to support subject matter. The instructor gives patients tips, tricks, and techniques to help them not only survive but enjoy the holidays despite the onslaught of food that accompanies the holidays.   Knowledge Questionnaire Score: Knowledge Questionnaire Score - 04/21/18 1023      Knowledge Questionnaire Score   Pre Score  20/24       Core Components/Risk Factors/Patient Goals at Admission: Personal Goals and Risk Factors at Admission - 04/21/18 1040      Core Components/Risk Factors/Patient Goals on Admission    Weight Management  Obesity;Yes    Intervention  Weight Management/Obesity: Establish reasonable short term and long term weight goals.;Obesity: Provide education and appropriate resources to help participant work on and attain dietary goals.    Admit Weight  300 lb 0.7 oz (136.1 kg)    Goal Weight: Short Term  294 lb (133.4 kg)    Goal Weight: Long Term  200 lb (90.7 kg)    Expected Outcomes   Short Term: Continue to assess and modify interventions until short term weight is achieved;Long Term: Adherence to nutrition and physical activity/exercise program aimed toward attainment of established weight goal;Weight Loss: Understanding of general recommendations for a balanced deficit meal plan, which promotes 1-2 lb weight loss per week and includes a negative energy balance of [id number] kcal/d;Understanding recommendations for meals to include 15-35% energy as protein, 25-35% energy from fat, 35-60% energy from carbohydrates, less than 200mg  of dietary cholesterol, 20-35 gm of total fiber daily;Understanding of distribution of calorie intake throughout the day with the consumption of 4-5 meals/snacks    Heart Failure  Yes    Intervention  Provide a combined exercise and nutrition program that is supplemented with education, support and counseling about heart failure. Directed toward relieving symptoms such as shortness of breath, decreased exercise tolerance, and extremity edema.    Expected Outcomes  Improve functional capacity of life;Short term: Attendance in program 2-3 days a week with increased exercise capacity. Reported lower sodium intake. Reported increased fruit and vegetable intake. Reports medication compliance.;Short term: Daily weights obtained and reported for increase. Utilizing diuretic protocols set by physician.;Long term: Adoption of self-care skills and reduction of barriers for early signs and symptoms recognition and intervention leading to self-care  maintenance.    Hypertension  Yes    Intervention  Provide education on lifestyle modifcations including regular physical activity/exercise, weight management, moderate sodium restriction and increased consumption of fresh fruit, vegetables, and low fat dairy, alcohol moderation, and smoking cessation.;Monitor prescription use compliance.    Expected Outcomes  Short Term: Continued assessment and intervention until BP is < 140/27mm  HG in hypertensive participants. < 130/30mm HG in hypertensive participants with diabetes, heart failure or chronic kidney disease.;Long Term: Maintenance of blood pressure at goal levels.       Core Components/Risk Factors/Patient Goals Review:  Goals and Risk Factor Review    Row Name 04/27/18 0900 05/14/18 0459 06/04/18 1641         Core Components/Risk Factors/Patient Goals Review   Personal Goals Review  Weight Management/Obesity;Hypertension;Heart Failure  Weight Management/Obesity;Hypertension;Heart Failure  Weight Management/Obesity;Hypertension;Heart Failure     Review  Pt with multiple CAD RFs willing to participate in CR exercise.  Sedonia would like to increase her heart strength.  Pt with multiple CAD RFs willing to participate in CR exercise.  Leili is tolerating exercise well.  VSS.  Pt with multiple CAD RFs willing to participate in CR exercise.  Jeslynn is tolerating exercise well.  She continues to have intermittent absences but reports increased motivation and balance.      Expected Outcomes  Pt will continue to participate in CR exercise, nutrition, and lifestyle modification opportunities.   Pt will continue to participate in CR exercise, nutrition, and lifestyle modification opportunities.   Pt will continue to participate in CR exercise, nutrition, and lifestyle modification opportunities.         Core Components/Risk Factors/Patient Goals at Discharge (Final Review):  Goals and Risk Factor Review - 06/04/18 1641      Core Components/Risk Factors/Patient Goals Review   Personal Goals Review  Weight Management/Obesity;Hypertension;Heart Failure    Review  Pt with multiple CAD RFs willing to participate in CR exercise.  Rejina is tolerating exercise well.  She continues to have intermittent absences but reports increased motivation and balance.     Expected Outcomes  Pt will continue to participate in CR exercise, nutrition, and lifestyle modification opportunities.         ITP Comments: ITP Comments    Row Name 04/21/18 0835 04/27/18 0849 05/14/18 0810 06/04/18 1640     ITP Comments  Medical Director - Dr. Fransico Him, MD  Pt started exercise today and tolerated it well.   30 Day ITP Review.  Jaylah is tolerating exercise well.  She has had some absences d/t sickness and appointments.   30 Day ITP Review.  Alanny is tolerating exercise well.  She continues to have intermittent absences d/t sickness and appointments.  She does feel that she is increasing his balance.        Comments: See ITP Comments.

## 2018-06-12 ENCOUNTER — Encounter (HOSPITAL_COMMUNITY): Payer: PRIVATE HEALTH INSURANCE

## 2018-06-15 ENCOUNTER — Encounter (HOSPITAL_COMMUNITY)
Admission: RE | Admit: 2018-06-15 | Discharge: 2018-06-15 | Disposition: A | Discharge status: Home / Self Care | Payer: PRIVATE HEALTH INSURANCE | Source: Ambulatory Visit | Attending: Cardiology | Admitting: Cardiology

## 2018-06-15 ENCOUNTER — Encounter (HOSPITAL_COMMUNITY): Payer: PRIVATE HEALTH INSURANCE

## 2018-06-15 DIAGNOSIS — Z79899 Other long term (current) drug therapy: Secondary | ICD-10-CM | POA: Insufficient documentation

## 2018-06-15 DIAGNOSIS — I110 Hypertensive heart disease with heart failure: Secondary | ICD-10-CM | POA: Insufficient documentation

## 2018-06-15 DIAGNOSIS — D649 Anemia, unspecified: Secondary | ICD-10-CM | POA: Insufficient documentation

## 2018-06-15 DIAGNOSIS — I5022 Chronic systolic (congestive) heart failure: Secondary | ICD-10-CM | POA: Diagnosis present

## 2018-06-15 DIAGNOSIS — G35 Multiple sclerosis: Secondary | ICD-10-CM | POA: Insufficient documentation

## 2018-06-16 ENCOUNTER — Ambulatory Visit (HOSPITAL_BASED_OUTPATIENT_CLINIC_OR_DEPARTMENT_OTHER)
Admission: RE | Admit: 2018-06-16 | Discharge: 2018-06-16 | Disposition: A | Discharge status: Home / Self Care | Payer: PRIVATE HEALTH INSURANCE | Source: Ambulatory Visit | Attending: Family Medicine | Admitting: Family Medicine

## 2018-06-16 ENCOUNTER — Ambulatory Visit (HOSPITAL_COMMUNITY)
Admission: RE | Admit: 2018-06-16 | Discharge: 2018-06-16 | Disposition: A | Discharge status: Home / Self Care | Payer: PRIVATE HEALTH INSURANCE | Source: Ambulatory Visit | Attending: Cardiology | Admitting: Cardiology

## 2018-06-16 ENCOUNTER — Encounter (HOSPITAL_COMMUNITY): Payer: Self-pay | Admitting: Cardiology

## 2018-06-16 VITALS — BP 128/84 | HR 73 | Wt 303.0 lb

## 2018-06-16 DIAGNOSIS — G35 Multiple sclerosis: Secondary | ICD-10-CM | POA: Insufficient documentation

## 2018-06-16 DIAGNOSIS — I77810 Thoracic aortic ectasia: Secondary | ICD-10-CM | POA: Insufficient documentation

## 2018-06-16 DIAGNOSIS — I5022 Chronic systolic (congestive) heart failure: Secondary | ICD-10-CM | POA: Diagnosis not present

## 2018-06-16 DIAGNOSIS — I5021 Acute systolic (congestive) heart failure: Secondary | ICD-10-CM | POA: Diagnosis not present

## 2018-06-16 DIAGNOSIS — I428 Other cardiomyopathies: Secondary | ICD-10-CM | POA: Diagnosis not present

## 2018-06-16 DIAGNOSIS — Z79899 Other long term (current) drug therapy: Secondary | ICD-10-CM | POA: Diagnosis not present

## 2018-06-16 DIAGNOSIS — Z6841 Body Mass Index (BMI) 40.0 and over, adult: Secondary | ICD-10-CM | POA: Insufficient documentation

## 2018-06-16 DIAGNOSIS — Z791 Long term (current) use of non-steroidal anti-inflammatories (NSAID): Secondary | ICD-10-CM | POA: Diagnosis not present

## 2018-06-16 DIAGNOSIS — E669 Obesity, unspecified: Secondary | ICD-10-CM | POA: Diagnosis not present

## 2018-06-16 DIAGNOSIS — Z8249 Family history of ischemic heart disease and other diseases of the circulatory system: Secondary | ICD-10-CM | POA: Diagnosis not present

## 2018-06-16 DIAGNOSIS — I509 Heart failure, unspecified: Secondary | ICD-10-CM | POA: Diagnosis present

## 2018-06-16 DIAGNOSIS — I110 Hypertensive heart disease with heart failure: Secondary | ICD-10-CM | POA: Diagnosis not present

## 2018-06-16 DIAGNOSIS — D649 Anemia, unspecified: Secondary | ICD-10-CM | POA: Insufficient documentation

## 2018-06-16 DIAGNOSIS — D259 Leiomyoma of uterus, unspecified: Secondary | ICD-10-CM | POA: Insufficient documentation

## 2018-06-16 LAB — BASIC METABOLIC PANEL
Anion gap: 5 (ref 5–15)
BUN: 11 mg/dL (ref 6–20)
CO2: 25 mmol/L (ref 22–32)
Calcium: 8.9 mg/dL (ref 8.9–10.3)
Chloride: 110 mmol/L (ref 98–111)
Creatinine, Ser: 0.74 mg/dL (ref 0.44–1.00)
GFR calc Af Amer: >60 mL/min (ref >60)
GFR calc non Af Amer: >60 mL/min (ref >60)
Glucose, Bld: 92 mg/dL (ref 70–99)
Potassium: 4.1 mmol/L (ref 3.5–5.1)
Sodium: 140 mmol/L (ref 135–145)

## 2018-06-16 MED ORDER — ISOSORB DINITRATE-HYDRALAZINE 20-37.5 MG PO TABS: 1.0000 | ORAL_TABLET | Freq: Three times a day (TID) | ORAL | 3 refills | Status: DC

## 2018-06-16 NOTE — Progress Notes (Signed)
  Echocardiogram 2D Echocardiogram has been performed.  Jannett Celestine 06/16/2018, 8:56 AM

## 2018-06-16 NOTE — Patient Instructions (Signed)
Labs done today  START Bidil 1 tab three times daily  Please follow up with Doroteo Bradford, our heart failure pharmacist in 1 month  Follow up with Dr. Aundra Dubin in 3 months

## 2018-06-16 NOTE — Progress Notes (Signed)
Advanced Heart Failure Clinic Note   Referring Physician: PCP: [name], PA PCP-Cardiologist: Dr Aundra Dubin  HPI: Amanda [name] is a 37 y.o. female with a history of multiple sclerosis, anemia due to uterine fibroids, and systolic HF (dx 09/4266).  Admitted to [facility] 5/31-01/13/18  with volume overload and found to have acute systolic heart failure. Echo showed EF 10-15%. HF team consulted. R/LHC completed, which showed no CAD and preserved CO. Cardiac MRI showed no LGE, EF 14%. She was diuresed with IV lasix and transitioned to lasix 40 mg daily. HF meds were optimized. DC weight: 299 lbs.  Echo done today was reviewed, EF 35% with diffuse hypokinesis.   Today she returns for HF follow up.  She is doing well, no dyspnea walking on flat ground.  No orthopnea/PND.  NO lightheadedness.  No chest pain. She is doing cardiac rehab.  Dyspnea with moderate to heavy exercise such as using the elliptical at cardiac rehab.   Labs (6/19): K 4.3, creatinine 0.88 Labs (10/19): K 3.4, creatinine 0.8  PMH:  1. Multiple sclerosis 2. Uterine fibroids 3. Anemia: Likely related to fibroids.  4. Chronic systolic CHF: Diagnosed in 5/19.  Nonischemic cardiomyopathy.  - Echo (6/19) with EF 10-15%.  - RHC/LHC (6/19): mean RA 5, PA 30/13 mean 22, mean PCWP 9, CI 2.63.  No angiographic CAD.  - Cardiac MRI (6/19): EF 14% with severely dilated LV, mildly dilated RV with mildly decreased systolic function, no myocardial LGE.  - Echo (8/19): EF 30%, diffuse hypokinesis, mildly dilated RV with normal systolic function, PASP 40 mmHg.  - Echo (11/19): EF 35%, diffuse hypokinesis, mildly dilated RV with normal systolic function.  5. Sleep study (9/19): No OSA.   Review of systems complete and found to be negative unless listed in HPI.    Current Outpatient Medications  Medication Sig Dispense Refill  . carvedilol (COREG) 25 MG tablet Take 1 tablet (25 mg total) by mouth 2 (two) times daily with a meal. 60 tablet 5    . ferrous sulfate 325 (65 FE) MG tablet Take 1 tablet (325 mg total) by mouth daily with breakfast. 30 tablet 3  . furosemide (LASIX) 40 MG tablet Take 0.5 tablets (20 mg total) by mouth daily. 15 tablet 5  . ibuprofen (ADVIL,MOTRIN) 200 MG tablet Take 200 mg by mouth every 6 (six) hours as needed for headache.    . potassium chloride SA (K-DUR,KLOR-CON) 20 MEQ tablet Take 2 tablets (40 mEq total) by mouth daily. 30 tablet 6  . sacubitril-valsartan (ENTRESTO) 97-103 MG Take 1 tablet by mouth 2 (two) times daily. 60 tablet 5  . spironolactone (ALDACTONE) 25 MG tablet Take 1 tablet (25 mg total) by mouth daily. 30 tablet 6  . Teriflunomide (AUBAGIO) 14 MG TABS Take 14 mg by mouth daily.    . isosorbide-hydrALAZINE (BIDIL) 20-37.5 MG tablet Take 1 tablet by mouth 3 (three) times daily. 90 tablet 3   No current facility-administered medications for this encounter.     No Known Allergies    Social History   Socioeconomic History  . Marital status: Married    Spouse name: Jenny Reichmann  . Number of children: Not on file  . Years of education: Not on file  . Highest education level: Not on file  Occupational History  . Not on file  Social Needs  . Financial resource strain: Somewhat hard  . Food insecurity:    Worry: Not on file    Inability: Not on file  .  Transportation needs:    Medical: Not on file    Non-medical: Not on file  Tobacco Use  . Smoking status: Never Smoker  . Smokeless tobacco: Never Used  Substance and Sexual Activity  . Alcohol use: Never    Frequency: Never  . Drug use: Never  . Sexual activity: Yes  Lifestyle  . Physical activity:    Days per week: 0 days    Minutes per session: 0 min  . Stress: Only a little  Relationships  . Social connections:    Talks on phone: Not on file    Gets together: Not on file    Attends religious service: Not on file    Active member of club or organization: Not on file    Attends meetings of clubs or organizations: Not on  file    Relationship status: Not on file  . Intimate partner violence:    Fear of current or ex partner: Not on file    Emotionally abused: Not on file    Physically abused: Not on file    Forced sexual activity: Not on file  Other Topics Concern  . Not on file  Social History Narrative  . Not on file      Family History  Problem Relation Age of Onset  . Diabetes Mother   . Hypertension Mother   . Fibroids Sister     Vitals:   06/16/18 0859  BP: 128/84  Pulse: 73  SpO2: 98%  Weight: (!) 137.4 kg (303 lb)   Wt Readings from Last 3 Encounters:  06/16/18 (!) 137.4 kg (303 lb)  05/26/18 136.1 kg (300 lb)  05/05/18 135.4 kg (298 lb 9.6 oz)   PHYSICAL EXAM: General: NAD Neck: No JVD, no thyromegaly or thyroid nodule.  Lungs: Clear to auscultation bilaterally with normal respiratory effort. CV: Nondisplaced PMI.  Heart regular S1/S2, no S3/S4, no murmur.  No peripheral edema.  No carotid bruit.  Normal pedal pulses.  Abdomen: Soft, nontender, no hepatosplenomegaly, no distention.  Skin: Intact without lesions or rashes.  Neurologic: Alert and oriented x 3.  Psych: Normal affect. Extremities: No clubbing or cyanosis.  HEENT: Normal.   ASSESSMENT & PLAN: 1. Chronic systolic CHF: Echo 7/32 with EF 10-15%, moderate MR. Nonischemic cardiomyopathy with no coronary disease on 6/19 cath. 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 Mother developed CHF in her 81s but no other family members have CHF that she knows of (has several healthy siblings). She does not drink ETOH or use drugs. No children, has not been pregnant so not peri-partum CMP. TSH normal.  RHC 01/12/18 with normal filling pressures and preserved CO.  Cardiac MRI 01/12/18 did not show LGE, EF 14%. Repeat echo 11/19 showed EF up to 35%.  She is not volume overloaded on exam, NYHA class II symptoms.  - Continue Lasix 20 mg daily.  BMET today.   - Continue Entresto 97/103 bid. - Continue spironolactone 25 daily.   -  Continue Coreg 25 mg bid.  - Start Bidil 1 tab tid.  - EF has improved some on today's echo.  Repeat echo in 6 months.  If EF > 35%, can avoid ICD.  Narrow QRS so not CRT candidate.   2. History of uterine fibroids with bleeding/anemia: Stable currently.  3. Multiple sclerosis: Quiescent currently. Aubagio, her MS medication, does not appear to have CHF as a side effect. Only cardiac side effect appears to be HTN. 4. Snores: Sleep study was negative.   5. Obesity:  We discussed weight loss with diet and exercise.  Body mass index is 47.46 kg/m.   Followup in 1 month with CHF pharmacist for medication titration.  See me in 3 months.   Loralie Champagne, MD 06/16/18

## 2018-06-17 ENCOUNTER — Encounter (HOSPITAL_COMMUNITY)
Admission: RE | Admit: 2018-06-17 | Discharge: 2018-06-17 | Disposition: A | Discharge status: Home / Self Care | Payer: PRIVATE HEALTH INSURANCE | Source: Ambulatory Visit | Attending: Cardiology | Admitting: Cardiology

## 2018-06-17 ENCOUNTER — Encounter (HOSPITAL_COMMUNITY): Payer: PRIVATE HEALTH INSURANCE

## 2018-06-17 DIAGNOSIS — I110 Hypertensive heart disease with heart failure: Secondary | ICD-10-CM | POA: Diagnosis not present

## 2018-06-17 DIAGNOSIS — I5022 Chronic systolic (congestive) heart failure: Secondary | ICD-10-CM

## 2018-06-19 ENCOUNTER — Encounter (HOSPITAL_COMMUNITY): Payer: PRIVATE HEALTH INSURANCE

## 2018-06-19 ENCOUNTER — Encounter (HOSPITAL_COMMUNITY)
Admission: RE | Admit: 2018-06-19 | Discharge: 2018-06-19 | Disposition: A | Discharge status: Home / Self Care | Payer: PRIVATE HEALTH INSURANCE | Source: Ambulatory Visit | Attending: Cardiology | Admitting: Cardiology

## 2018-06-19 DIAGNOSIS — I110 Hypertensive heart disease with heart failure: Secondary | ICD-10-CM | POA: Diagnosis not present

## 2018-06-22 ENCOUNTER — Encounter (HOSPITAL_COMMUNITY): Payer: PRIVATE HEALTH INSURANCE

## 2018-06-22 ENCOUNTER — Encounter (HOSPITAL_COMMUNITY)
Admission: RE | Admit: 2018-06-22 | Discharge: 2018-06-22 | Disposition: A | Discharge status: Home / Self Care | Payer: PRIVATE HEALTH INSURANCE | Source: Ambulatory Visit | Attending: Cardiology | Admitting: Cardiology

## 2018-06-22 DIAGNOSIS — I5022 Chronic systolic (congestive) heart failure: Secondary | ICD-10-CM

## 2018-06-22 DIAGNOSIS — I110 Hypertensive heart disease with heart failure: Secondary | ICD-10-CM | POA: Diagnosis not present

## 2018-06-24 ENCOUNTER — Encounter (HOSPITAL_COMMUNITY): Payer: PRIVATE HEALTH INSURANCE

## 2018-06-26 ENCOUNTER — Encounter (HOSPITAL_COMMUNITY): Payer: PRIVATE HEALTH INSURANCE

## 2018-06-26 ENCOUNTER — Encounter (HOSPITAL_COMMUNITY)
Admission: RE | Admit: 2018-06-26 | Discharge: 2018-06-26 | Disposition: A | Discharge status: Home / Self Care | Payer: PRIVATE HEALTH INSURANCE | Source: Ambulatory Visit | Attending: Cardiology | Admitting: Cardiology

## 2018-06-26 DIAGNOSIS — I5022 Chronic systolic (congestive) heart failure: Secondary | ICD-10-CM

## 2018-06-26 DIAGNOSIS — I110 Hypertensive heart disease with heart failure: Secondary | ICD-10-CM | POA: Diagnosis not present

## 2018-06-29 ENCOUNTER — Encounter (HOSPITAL_COMMUNITY)
Admission: RE | Admit: 2018-06-29 | Discharge: 2018-06-29 | Disposition: A | Discharge status: Home / Self Care | Payer: PRIVATE HEALTH INSURANCE | Source: Ambulatory Visit | Attending: Cardiology | Admitting: Cardiology

## 2018-06-29 ENCOUNTER — Encounter (HOSPITAL_COMMUNITY): Payer: PRIVATE HEALTH INSURANCE

## 2018-06-29 ENCOUNTER — Encounter (HOSPITAL_COMMUNITY): Payer: Self-pay

## 2018-06-29 DIAGNOSIS — I5022 Chronic systolic (congestive) heart failure: Secondary | ICD-10-CM

## 2018-06-29 DIAGNOSIS — I110 Hypertensive heart disease with heart failure: Secondary | ICD-10-CM | POA: Diagnosis not present

## 2018-07-01 ENCOUNTER — Encounter (HOSPITAL_COMMUNITY): Payer: PRIVATE HEALTH INSURANCE

## 2018-07-01 ENCOUNTER — Encounter (HOSPITAL_COMMUNITY)
Admission: RE | Admit: 2018-07-01 | Discharge: 2018-07-01 | Disposition: A | Discharge status: Home / Self Care | Payer: PRIVATE HEALTH INSURANCE | Source: Ambulatory Visit | Attending: Cardiology | Admitting: Cardiology

## 2018-07-01 DIAGNOSIS — I5022 Chronic systolic (congestive) heart failure: Secondary | ICD-10-CM

## 2018-07-01 DIAGNOSIS — I110 Hypertensive heart disease with heart failure: Secondary | ICD-10-CM | POA: Diagnosis not present

## 2018-07-02 NOTE — Progress Notes (Signed)
Cardiac Individual Treatment Plan  Patient Details  Name: Amanda [name] MRN: [id number] Date of Birth: 11/23/1980 Referring Provider:     CARDIAC REHAB PHASE II ORIENTATION from 04/21/2018 in [facility]  Referring Provider  Loralie Champagne, MD      Initial Encounter Date:    CARDIAC REHAB PHASE II ORIENTATION from 04/21/2018 in [facility]  Date  04/21/18      Visit Diagnosis: Heart failure, chronic systolic (HCC)  Patient's Home Medications on Admission:  Current Outpatient Medications:  .  carvedilol (COREG) 25 MG tablet, Take 1 tablet (25 mg total) by mouth 2 (two) times daily with a meal., Disp: 60 tablet, Rfl: 5 .  ferrous sulfate 325 (65 FE) MG tablet, Take 1 tablet (325 mg total) by mouth daily with breakfast., Disp: 30 tablet, Rfl: 3 .  furosemide (LASIX) 40 MG tablet, Take 0.5 tablets (20 mg total) by mouth daily., Disp: 15 tablet, Rfl: 5 .  ibuprofen (ADVIL,MOTRIN) 200 MG tablet, Take 200 mg by mouth every 6 (six) hours as needed for headache., Disp: , Rfl:  .  isosorbide-hydrALAZINE (BIDIL) 20-37.5 MG tablet, Take 1 tablet by mouth 3 (three) times daily., Disp: 90 tablet, Rfl: 3 .  potassium chloride SA (K-DUR,KLOR-CON) 20 MEQ tablet, Take 2 tablets (40 mEq total) by mouth daily., Disp: 30 tablet, Rfl: 6 .  sacubitril-valsartan (ENTRESTO) 97-103 MG, Take 1 tablet by mouth 2 (two) times daily., Disp: 60 tablet, Rfl: 5 .  spironolactone (ALDACTONE) 25 MG tablet, Take 1 tablet (25 mg total) by mouth daily., Disp: 30 tablet, Rfl: 6 .  Teriflunomide (AUBAGIO) 14 MG TABS, Take 14 mg by mouth daily., Disp: , Rfl:   Past Medical History: Past Medical History:  Diagnosis Date  . Anemia   . BP (high blood pressure)   . CHF (congestive heart failure) ([place])   . History of blood transfusion 08/2017   "related to low blood count"   . Multiple sclerosis ([place])   . Pneumonia 12/2017    Tobacco Use: Social History    Tobacco Use  Smoking Status Never Smoker  Smokeless Tobacco Never Used    Labs: Recent Review Scientist, physiological    Labs for ITP Cardiac and Pulmonary Rehab Latest Ref Rng & Units 01/12/2018 01/12/2018   HCO3 20.0 - 28.0 mmol/L 25.4 25.2   TCO2 22 - 32 mmol/L 27 26   O2SAT % 67.0 64.0      Capillary Blood Glucose: No results found for: GLUCAP   Exercise Target Goals: Exercise Program Goal: Individual exercise prescription set using results from initial 6 min walk test and THRR while considering  patient's activity barriers and safety.   Exercise Prescription Goal: Initial exercise prescription builds to 30-45 minutes a day of aerobic activity, 2-3 days per week.  Home exercise guidelines will be given to patient during program as part of exercise prescription that the participant will acknowledge.  Activity Barriers & Risk Stratification: Activity Barriers & Cardiac Risk Stratification - 04/21/18 0838      Activity Barriers & Cardiac Risk Stratification   Activity Barriers  Balance Concerns   Balance concerns secondary to multiple sclerosis   Cardiac Risk Stratification  High       6 Minute Walk: 6 Minute Walk    Row Name 04/21/18 0822         6 Minute Walk   Phase  Initial     Distance  1340 feet  Walk Time  6 minutes     # of Rest Breaks  0     MPH  2.54     METS  3.94     RPE  11     Perceived Dyspnea   3     VO2 Peak  13.78     Symptoms  Yes (comment)     Comments  Dyspnea     Resting HR  95 bpm     Resting BP  98/60     Resting Oxygen Saturation   98 %     Exercise Oxygen Saturation  during 6 min walk  98 %     Max Ex. HR  130 bpm     Max Ex. BP  120/70     2 Minute Post BP  108/60        Oxygen Initial Assessment:   Oxygen Re-Evaluation:   Oxygen Discharge (Final Oxygen Re-Evaluation):   Initial Exercise Prescription: Initial Exercise Prescription - 04/21/18 1000      Date of Initial Exercise RX and Referring Provider   Date  04/21/18     Referring Provider  Loralie Champagne, MD      Treadmill   MPH  1.7    Grade  0    Minutes  10    METs  2.3      Recumbant Bike   Level  2    Watts  25    Minutes  10    METs  2.58      NuStep   Level  2    SPM  85    Minutes  10    METs  2.5      Track   Laps  10    Minutes  10    METs  2.74      Prescription Details   Frequency (times per week)  3    Duration  Progress to 30 minutes of continuous aerobic without signs/symptoms of physical distress      Intensity   THRR 40-80% of Max Heartrate  74-148    Ratings of Perceived Exertion  11-13    Perceived Dyspnea  0-4      Progression   Progression  Continue to progress workloads to maintain intensity without signs/symptoms of physical distress.      Resistance Training   Training Prescription  Yes    Weight  3lbs       Perform Capillary Blood Glucose checks as needed.  Exercise Prescription Changes: Exercise Prescription Changes    Row Name 04/27/18 0800 05/13/18 1600 05/22/18 1624 06/05/18 0846 06/22/18 1336     Response to Exercise   Blood Pressure (Admit)  120/70  110/62  120/78  108/60  114/78   Blood Pressure (Exercise)  118/60  122/64  118/78  134/74  122/64   Blood Pressure (Exit)  101/70  110/58  104/60  108/60  102/60   Heart Rate (Admit)  99 bpm  99 bpm  94 bpm  100 bpm  95 bpm   Heart Rate (Exercise)  129 bpm  134 bpm  123 bpm  136 bpm  143 bpm   Heart Rate (Exit)  87 bpm  83 bpm  91 bpm  92 bpm  95 bpm   Rating of Perceived Exertion (Exercise)  12  12  12  12  12    Perceived Dyspnea (Exercise)  0  0  0  0  0   Symptoms  None  None  None  None  None   Comments  Pt oriented to exercise equipment   -  None  None  None   Duration  Progress to 30 minutes of  aerobic without signs/symptoms of physical distress  Continue with 30 min of aerobic exercise without signs/symptoms of physical distress.  Continue with 30 min of aerobic exercise without signs/symptoms of physical distress.  Progress to 45  minutes of aerobic exercise without signs/symptoms of physical distress  Progress to 45 minutes of aerobic exercise without signs/symptoms of physical distress   Intensity  THRR New  THRR unchanged  THRR unchanged  THRR unchanged  THRR unchanged     Progression   Progression  Continue to progress workloads to maintain intensity without signs/symptoms of physical distress.  Continue to progress workloads to maintain intensity without signs/symptoms of physical distress.  Continue to progress workloads to maintain intensity without signs/symptoms of physical distress.  Continue to progress workloads to maintain intensity without signs/symptoms of physical distress.  Continue to progress workloads to maintain intensity without signs/symptoms of physical distress.   Average METs  2.2  2.08  2.7  2.9  2.6     Resistance Training   Training Prescription  Yes  No  Yes  Yes  Yes   Weight  3lbs  -  4lbs  4lbs  5lbs   Reps  10-15  -  10-15  10-15  10-15   Time  10 Minutes  -  10 Minutes  10 Minutes  10 Minutes     Interval Training   Interval Training  -  No  No  No  No     Treadmill   MPH  1.9  1.9  2.1  2.1  2.1   Grade  0  0  1  1  1    Minutes  10  10  10  10  10    METs  2.45  2.45  2.9  2.9  2.9     Recumbant Bike   Level  2  2  3  3  3    Watts  25  20  20  20  20    Minutes  10  10  10  10  10    METs  2.58  1.7  1.7  1.7  1.7     NuStep   Level  2  3  3  5  5    SPM  85  85  85  95  95   Minutes  10  10  10  10  10    METs  1.9  2.1  3.5  4.1  3.1     Home Exercise Plan   Plans to continue exercise at  -  -  Home (comment) Walking  Home (comment) Walking  Home (comment) Walking   Frequency  -  -  Add 2 additional days to program exercise sessions.  Add 2 additional days to program exercise sessions.  Add 2 additional days to program exercise sessions.   Initial Home Exercises Provided  -  -  05/18/18  05/18/18  05/18/18   Row Name 06/29/18 1000             Response to Exercise    Blood Pressure (Admit)  104/62       Blood Pressure (Exercise)  118/68       Blood Pressure (Exit)  102/60       Heart Rate (Admit)  89 bpm  Heart Rate (Exercise)  130 bpm       Heart Rate (Exit)  89 bpm       Rating of Perceived Exertion (Exercise)  12       Perceived Dyspnea (Exercise)  0       Symptoms  None       Comments  None       Duration  Progress to 45 minutes of aerobic exercise without signs/symptoms of physical distress       Intensity  THRR unchanged         Progression   Progression  Continue to progress workloads to maintain intensity without signs/symptoms of physical distress.       Average METs  3         Resistance Training   Training Prescription  Yes       Weight  5lbs       Reps  10-15       Time  10 Minutes         Interval Training   Interval Training  No         Treadmill   MPH  2.1       Grade  1       Minutes  10       METs  2.9         Recumbant Bike   Level  2 Upright SciFit Bike       Minutes  10       METs  3         NuStep   Level  5       SPM  95       Minutes  10       METs  3.1         Home Exercise Plan   Plans to continue exercise at  Home (comment) Walking       Frequency  Add 2 additional days to program exercise sessions.       Initial Home Exercises Provided  05/18/18          Exercise Comments: Exercise Comments    Row Name 04/27/18 0810 05/22/18 1628 05/25/18 1627 06/29/18 1023     Exercise Comments  Pt oriented exercise equipment. Pt tolerated workloads well. Will continue to monitor and progress pt as tolerated.   Reviewed HEP with pt. Will continue to monitor and progress pt as tolerated.   -  Reviewed METs and Goals with pt. Pt is continuing to put forth great effort with exercise. Will continue to monitor pt's progression.       Exercise Goals and Review: Exercise Goals    Row Name 04/21/18 0902             Exercise Goals   Increase Physical Activity  Yes       Intervention  Provide advice,  education, support and counseling about physical activity/exercise needs.;Develop an individualized exercise prescription for aerobic and resistive training based on initial evaluation findings, risk stratification, comorbidities and participant's personal goals.       Expected Outcomes  Short Term: Attend rehab on a regular basis to increase amount of physical activity.;Long Term: Exercising regularly at least 3-5 days a week.;Long Term: Add in home exercise to make exercise part of routine and to increase amount of physical activity.       Increase Strength and Stamina  Yes       Intervention  Provide advice, education, support and counseling  about physical activity/exercise needs.;Develop an individualized exercise prescription for aerobic and resistive training based on initial evaluation findings, risk stratification, comorbidities and participant's personal goals.       Expected Outcomes  Short Term: Increase workloads from initial exercise prescription for resistance, speed, and METs.;Short Term: Perform resistance training exercises routinely during rehab and add in resistance training at home;Long Term: Improve cardiorespiratory fitness, muscular endurance and strength as measured by increased METs and functional capacity (6MWT)       Able to understand and use rate of perceived exertion (RPE) scale  Yes       Intervention  Provide education and explanation on how to use RPE scale       Expected Outcomes  Short Term: Able to use RPE daily in rehab to express subjective intensity level;Long Term:  Able to use RPE to guide intensity level when exercising independently       Knowledge and understanding of Target Heart Rate Range (THRR)  Yes       Intervention  Provide education and explanation of THRR including how the numbers were predicted and where they are located for reference       Expected Outcomes  Short Term: Able to state/look up THRR;Long Term: Able to use THRR to govern intensity when  exercising independently;Short Term: Able to use daily as guideline for intensity in rehab       Able to check pulse independently  Yes       Intervention  Provide education and demonstration on how to check pulse in carotid and radial arteries.;Review the importance of being able to check your own pulse for safety during independent exercise       Expected Outcomes  Short Term: Able to explain why pulse checking is important during independent exercise;Long Term: Able to check pulse independently and accurately       Understanding of Exercise Prescription  Yes       Intervention  Provide education, explanation, and written materials on patient's individual exercise prescription       Expected Outcomes  Short Term: Able to explain program exercise prescription;Long Term: Able to explain home exercise prescription to exercise independently          Exercise Goals Re-Evaluation : Exercise Goals Re-Evaluation    Row Name 05/22/18 1629 06/29/18 1024           Exercise Goal Re-Evaluation   Exercise Goals Review  Increase Physical Activity;Able to understand and use rate of perceived exertion (RPE) scale;Knowledge and understanding of Target Heart Rate Range (THRR);Understanding of Exercise Prescription;Increase Strength and Stamina;Able to check pulse independently  Increase Physical Activity;Understanding of Exercise Prescription      Comments  Reviewed HEP with pt. Also reviewed THRR, RPE Scale, weather conditions, NTG use, endpoints of exercise, warmup and cool down.   Reviewed METs and Goals with pt. Added Upright Recumbent bike to pt's exercise prescription. Pt states this bike feels better than the regular recumbent bike. Pt puts forth great effort and is able to exercise 30 minutes with no issues.       Expected Outcomes  Pt will plan to continue to walk 2-3 days a week for 5 minutes. Pt will gradually increase walking time till she gets 30 minutes. Pt will continue to increase cardiovascular  fitness. Will continue to monitor pt.   Pt will continue to walk 2-3 days a week for 10-15 minutes. Pt will continue to increase cardiovascular fitness. Will continue to monitor.  Discharge Exercise Prescription (Final Exercise Prescription Changes): Exercise Prescription Changes - 06/29/18 1000      Response to Exercise   Blood Pressure (Admit)  104/62    Blood Pressure (Exercise)  118/68    Blood Pressure (Exit)  102/60    Heart Rate (Admit)  89 bpm    Heart Rate (Exercise)  130 bpm    Heart Rate (Exit)  89 bpm    Rating of Perceived Exertion (Exercise)  12    Perceived Dyspnea (Exercise)  0    Symptoms  None    Comments  None    Duration  Progress to 45 minutes of aerobic exercise without signs/symptoms of physical distress    Intensity  THRR unchanged      Progression   Progression  Continue to progress workloads to maintain intensity without signs/symptoms of physical distress.    Average METs  3      Resistance Training   Training Prescription  Yes    Weight  5lbs    Reps  10-15    Time  10 Minutes      Interval Training   Interval Training  No      Treadmill   MPH  2.1    Grade  1    Minutes  10    METs  2.9      Recumbant Bike   Level  2   Upright SciFit Bike   Minutes  10    METs  3      NuStep   Level  5    SPM  95    Minutes  10    METs  3.1      Home Exercise Plan   Plans to continue exercise at  Home (comment)   Walking   Frequency  Add 2 additional days to program exercise sessions.    Initial Home Exercises Provided  05/18/18       Nutrition:  Target Goals: Understanding of nutrition guidelines, daily intake of sodium 1500mg , cholesterol 200mg , calories 30% from fat and 7% or less from saturated fats, daily to have 5 or more servings of fruits and vegetables.  Biometrics: Pre Biometrics - 04/21/18 1029      Pre Biometrics   Height  5\' 7"  (1.702 m)    Weight  (!) 136.1 kg    Waist Circumference  42 inches    Hip  Circumference  56.25 inches    Waist to Hip Ratio  0.75 %    BMI (Calculated)  46.98    Triceps Skinfold  49 mm    % Body Fat  51.6 %    Grip Strength  31.5 kg    Flexibility  0 in    Single Leg Stand  3.41 seconds        Nutrition Therapy Plan and Nutrition Goals: Nutrition Therapy & Goals - 04/21/18 0851      Nutrition Therapy   Diet  heart healthy      Personal Nutrition Goals   Nutrition Goal  Pt to identify and limit food sources of saturated fat, trans fat, refined carbohydrates and sodium    Personal Goal #2  Pt to identify food quantities necessary to achieve weight loss of 6-24 lbs. at graduation from cardiac rehab      [place], educate and counsel regarding individualized specific dietary modifications aiming towards targeted core components such as weight, hypertension, lipid management, diabetes, heart failure and other comorbidities.  Expected Outcomes  Short Term Goal: Understand basic principles of dietary content, such as calories, fat, sodium, cholesterol and nutrients.;Long Term Goal: Adherence to prescribed nutrition plan.       Nutrition Assessments: Nutrition Assessments - 04/21/18 0852      MEDFICTS Scores   Pre Score  62       Nutrition Goals Re-Evaluation: Nutrition Goals Re-Evaluation    [place] Name 04/21/18 0851             Goals   Current Weight  300 lb 0.7 oz (136.1 kg)          Nutrition Goals Re-Evaluation: Nutrition Goals Re-Evaluation    [place] Name 04/21/18 0851             Goals   Current Weight  300 lb 0.7 oz (136.1 kg)          Nutrition Goals Discharge (Final Nutrition Goals Re-Evaluation): Nutrition Goals Re-Evaluation - 04/21/18 0851      Goals   Current Weight  300 lb 0.7 oz (136.1 kg)       Psychosocial: Target Goals: Acknowledge presence or absence of significant depression and/or stress, maximize coping skills, provide positive support system. Participant is able to  verbalize types and ability to use techniques and skills needed for reducing stress and depression.  Initial Review & Psychosocial Screening: Initial Psych Review & Screening - 04/21/18 1020      Initial Review   Current issues with  None Identified;Current Stress Concerns    Source of Stress Concerns  Chronic Illness;Unable to perform yard/household activities;Financial      Family Dynamics   Good Support System?  Yes   husband      Barriers   Psychosocial barriers to participate in program  There are no identifiable barriers or psychosocial needs.      Screening Interventions   Interventions  Encouraged to exercise    Expected Outcomes  Short Term goal: Utilizing psychosocial counselor, staff and physician to assist with identification of specific Stressors or current issues interfering with healing process. Setting desired goal for each stressor or current issue identified.;Long Term Goal: Stressors or current issues are controlled or eliminated.;Short Term goal: Identification and review with participant of any Quality of Life or Depression concerns found by scoring the questionnaire.;Long Term goal: The participant improves quality of Life and PHQ9 Scores as seen by post scores and/or verbalization of changes       Quality of Life Scores: Quality of Life - 04/21/18 1021      Quality of Life   Select  Quality of Life      Quality of Life Scores   Health/Function Pre  18.07 %    Socioeconomic Pre  16.56 %    Psych/Spiritual Pre  23.1 %    Family Pre  21.6 %    GLOBAL Pre  19.24 %      Scores of 19 and below usually indicate a poorer quality of life in these areas.  A difference of  2-3 points is a clinically meaningful difference.  A difference of 2-3 points in the total score of the Quality of Life Index has been associated with significant improvement in overall quality of life, self-image, physical symptoms, and general health in studies assessing change in quality of  life.  PHQ-9: Recent Review Flowsheet Data    Depression screen [facility] 2/9 04/27/2018   Decreased Interest 0   Down, Depressed, Hopeless 0   PHQ - 2 Score 0  Interpretation of Total Score  Total Score Depression Severity:  1-4 = Minimal depression, 5-9 = Mild depression, 10-14 = Moderate depression, 15-19 = Moderately severe depression, 20-27 = Severe depression   Psychosocial Evaluation and Intervention: Psychosocial Evaluation - 04/27/18 0849      Psychosocial Evaluation & Interventions   Interventions  Encouraged to exercise with the program and follow exercise prescription;Stress management education;Relaxation education    Comments  Marvie with some stress concerns related to her chronic illness.     Expected Outcomes  [place] will report decreased stress related to her chronic illness and report feeling the ability to manage this illness.     Continue Psychosocial Services   Follow up required by staff       Psychosocial Re-Evaluation: Psychosocial Re-Evaluation    Row Name 05/14/18 0811 06/04/18 1641 06/29/18 1529         Psychosocial Re-Evaluation   Current issues with  Current Stress Concerns  Current Stress Concerns  Current Stress Concerns     Comments  Encouraged attendance of stress management class to learn techniques.   Encouraged attendance of stress management class to learn techniques.   Encouraged attendance of stress management class to learn techniques.      Expected Outcomes  Haedyn will report ability to manage her stress with good coping skills.   Kaja will report ability to manage her stress with good coping skills.   Selicia will report ability to manage her stress with good coping skills.      Interventions  Stress management education;Relaxation education;Encouraged to attend Cardiac Rehabilitation for the exercise  Stress management education;Relaxation education;Encouraged to attend Cardiac Rehabilitation for the exercise  Stress management  education;Relaxation education;Encouraged to attend Cardiac Rehabilitation for the exercise     Continue Psychosocial Services   Follow up required by staff  Follow up required by staff  Follow up required by staff        Psychosocial Discharge (Final Psychosocial Re-Evaluation): Psychosocial Re-Evaluation - 06/29/18 1529      Psychosocial Re-Evaluation   Current issues with  Current Stress Concerns    Comments  Encouraged attendance of stress management class to learn techniques.     Expected Outcomes  Israella will report ability to manage her stress with good coping skills.     Interventions  Stress management education;Relaxation education;Encouraged to attend Cardiac Rehabilitation for the exercise    Continue Psychosocial Services   Follow up required by staff       Vocational Rehabilitation: Provide vocational rehab assistance to qualifying candidates.   Vocational Rehab Evaluation & Intervention: Vocational Rehab - 04/27/18 0859      Vocational Rehab Re-[facility]   Comments  Paperwork given to Sherlonda 04/27/18.       Education: Education Goals: Education classes will be provided on a weekly basis, covering required topics. Participant will state understanding/return demonstration of topics presented.  Learning Barriers/Preferences: Learning Barriers/Preferences - 04/21/18 0837      Learning Barriers/Preferences   Learning Barriers  None    Learning Preferences  None       Education Topics: Count Your Pulse:  -Group instruction provided by verbal instruction, demonstration, patient participation and written materials to support subject.  Instructors address importance of being able to find your pulse and how to count your pulse when at home without a heart monitor.  Patients get hands on experience counting their pulse with staff help and individually.   Heart Attack, Angina, and Risk Factor Modification:  -Group instruction provided  by verbal instruction, video, and  written materials to support subject.  Instructors address signs and symptoms of angina and heart attacks.    Also discuss risk factors for heart disease and how to make changes to improve heart health risk factors.   Functional Fitness:  -Group instruction provided by verbal instruction, demonstration, patient participation, and written materials to support subject.  Instructors address safety measures for doing things around the house.  Discuss how to get up and down off the floor, how to pick things up properly, how to safely get out of a chair without assistance, and balance training.   Meditation and Mindfulness:  -Group instruction provided by verbal instruction, patient participation, and written materials to support subject.  Instructor addresses importance of mindfulness and meditation practice to help reduce stress and improve awareness.  Instructor also leads participants through a meditation exercise.    Stretching for Flexibility and Mobility:  -Group instruction provided by verbal instruction, patient participation, and written materials to support subject.  Instructors lead participants through series of stretches that are designed to increase flexibility thus improving mobility.  These stretches are additional exercise for major muscle groups that are typically performed during regular warm up and cool down.   Hands Only CPR:  -Group verbal, video, and participation provides a basic overview of AHA guidelines for community CPR. Role-play of emergencies allow participants the opportunity to practice calling for help and chest compression technique with discussion of AED use.   Hypertension: -Group verbal and written instruction that provides a basic overview of hypertension including the most recent diagnostic guidelines, risk factor reduction with self-care instructions and medication management.    Nutrition I class: Heart Healthy Eating:  -Group instruction provided by  PowerPoint slides, verbal discussion, and written materials to support subject matter. The instructor gives an explanation and review of the Therapeutic Lifestyle Changes diet recommendations, which includes a discussion on lipid goals, dietary fat, sodium, fiber, plant stanol/sterol esters, sugar, and the components of a well-balanced, healthy diet.   Nutrition II class: Lifestyle Skills:  -Group instruction provided by PowerPoint slides, verbal discussion, and written materials to support subject matter. The instructor gives an explanation and review of label reading, grocery shopping for heart health, heart healthy recipe modifications, and ways to make healthier choices when eating out.   Diabetes Question & Answer:  -Group instruction provided by PowerPoint slides, verbal discussion, and written materials to support subject matter. The instructor gives an explanation and review of diabetes co-morbidities, pre- and post-prandial blood glucose goals, pre-exercise blood glucose goals, signs, symptoms, and treatment of hypoglycemia and hyperglycemia, and foot care basics.   Diabetes Blitz:  -Group instruction provided by PowerPoint slides, verbal discussion, and written materials to support subject matter. The instructor gives an explanation and review of the physiology behind type 1 and type 2 diabetes, diabetes medications and rational behind using different medications, pre- and post-prandial blood glucose recommendations and Hemoglobin A1c goals, diabetes diet, and exercise including blood glucose guidelines for exercising safely.    Portion Distortion:  -Group instruction provided by PowerPoint slides, verbal discussion, written materials, and food models to support subject matter. The instructor gives an explanation of serving size versus portion size, changes in portions sizes over the last 20 years, and what consists of a serving from each food group.   Stress Management:  -Group  instruction provided by verbal instruction, video, and written materials to support subject matter.  Instructors review role of stress in heart disease and how  to cope with stress positively.     Exercising on Your Own:  -Group instruction provided by verbal instruction, power point, and written materials to support subject.  Instructors discuss benefits of exercise, components of exercise, frequency and intensity of exercise, and end points for exercise.  Also discuss use of nitroglycerin and activating EMS.  Review options of places to exercise outside of rehab.  Review guidelines for sex with heart disease.   Cardiac Drugs I:  -Group instruction provided by verbal instruction and written materials to support subject.  Instructor reviews cardiac drug classes: antiplatelets, anticoagulants, beta blockers, and statins.  Instructor discusses reasons, side effects, and lifestyle considerations for each drug class.   Cardiac Drugs II:  -Group instruction provided by verbal instruction and written materials to support subject.  Instructor reviews cardiac drug classes: angiotensin converting enzyme inhibitors (ACE-I), angiotensin II receptor blockers (ARBs), nitrates, and calcium channel blockers.  Instructor discusses reasons, side effects, and lifestyle considerations for each drug class.   Anatomy and Physiology of the Circulatory System:  Group verbal and written instruction and models provide basic cardiac anatomy and physiology, with the coronary electrical and arterial systems. Review of: AMI, Angina, Valve disease, Heart Failure, Peripheral Artery Disease, Cardiac Arrhythmia, Pacemakers, and the ICD.   Other Education:  -Group or individual verbal, written, or video instructions that support the educational goals of the cardiac rehab program.   Holiday Eating Survival Tips:  -Group instruction provided by PowerPoint slides, verbal discussion, and written materials to support subject  matter. The instructor gives patients tips, tricks, and techniques to help them not only survive but enjoy the holidays despite the onslaught of food that accompanies the holidays.   Knowledge Questionnaire Score: Knowledge Questionnaire Score - 04/21/18 1023      Knowledge Questionnaire Score   Pre Score  20/24       Core Components/Risk Factors/Patient Goals at Admission: Personal Goals and Risk Factors at Admission - 04/21/18 1040      Core Components/Risk Factors/Patient Goals on Admission    Weight Management  Obesity;Yes    Intervention  Weight Management/Obesity: Establish reasonable short term and long term weight goals.;Obesity: Provide education and appropriate resources to help participant work on and attain dietary goals.    Admit Weight  300 lb 0.7 oz (136.1 kg)    Goal Weight: Short Term  294 lb (133.4 kg)    Goal Weight: Long Term  200 lb (90.7 kg)    Expected Outcomes  Short Term: Continue to assess and modify interventions until short term weight is achieved;Long Term: Adherence to nutrition and physical activity/exercise program aimed toward attainment of established weight goal;Weight Loss: Understanding of general recommendations for a balanced deficit meal plan, which promotes 1-2 lb weight loss per week and includes a negative energy balance of [id number] kcal/d;Understanding recommendations for meals to include 15-35% energy as protein, 25-35% energy from fat, 35-60% energy from carbohydrates, less than 200mg  of dietary cholesterol, 20-35 gm of total fiber daily;Understanding of distribution of calorie intake throughout the day with the consumption of 4-5 meals/snacks    Heart Failure  Yes    Intervention  Provide a combined exercise and nutrition program that is supplemented with education, support and counseling about heart failure. Directed toward relieving symptoms such as shortness of breath, decreased exercise tolerance, and extremity edema.    Expected Outcomes   Improve functional capacity of life;Short term: Attendance in program 2-3 days a week with increased exercise capacity. Reported lower sodium intake. Reported  increased fruit and vegetable intake. Reports medication compliance.;Short term: Daily weights obtained and reported for increase. Utilizing diuretic protocols set by physician.;Long term: Adoption of self-care skills and reduction of barriers for early signs and symptoms recognition and intervention leading to self-care maintenance.    Hypertension  Yes    Intervention  Provide education on lifestyle modifcations including regular physical activity/exercise, weight management, moderate sodium restriction and increased consumption of fresh fruit, vegetables, and low fat dairy, alcohol moderation, and smoking cessation.;Monitor prescription use compliance.    Expected Outcomes  Short Term: Continued assessment and intervention until BP is < 140/54mm HG in hypertensive participants. < 130/24mm HG in hypertensive participants with diabetes, heart failure or chronic kidney disease.;Long Term: Maintenance of blood pressure at goal levels.       Core Components/Risk Factors/Patient Goals Review:  Goals and Risk Factor Review    Row Name 04/27/18 0900 05/14/18 1194 06/04/18 1641 06/29/18 1530       Core Components/Risk Factors/Patient Goals Review   Personal Goals Review  Weight Management/Obesity;Hypertension;Heart Failure  Weight Management/Obesity;Hypertension;Heart Failure  Weight Management/Obesity;Hypertension;Heart Failure  Weight Management/Obesity;Hypertension;Heart Failure    Review  Pt with multiple CAD RFs willing to participate in CR exercise.  Kyarah would like to increase her heart strength.  Pt with multiple CAD RFs willing to participate in CR exercise.  Kayse is tolerating exercise well.  VSS.  Pt with multiple CAD RFs willing to participate in CR exercise.  Alawna is tolerating exercise well.  She continues to have intermittent absences  but reports increased motivation and balance.   Pt with multiple CAD RFs willing to participate in CR exercise.  Ninah continues to tolerate exercise well. She feels that she is increasing her strength and stamina.     Expected Outcomes  Pt will continue to participate in CR exercise, nutrition, and lifestyle modification opportunities.   Pt will continue to participate in CR exercise, nutrition, and lifestyle modification opportunities.   Pt will continue to participate in CR exercise, nutrition, and lifestyle modification opportunities.   Pt will continue to participate in CR exercise, nutrition, and lifestyle modification opportunities.        Core Components/Risk Factors/Patient Goals at Discharge (Final Review):  Goals and Risk Factor Review - 06/29/18 1530      Core Components/Risk Factors/Patient Goals Review   Personal Goals Review  Weight Management/Obesity;Hypertension;Heart Failure    Review  Pt with multiple CAD RFs willing to participate in CR exercise.  Leathia continues to tolerate exercise well. She feels that she is increasing her strength and stamina.     Expected Outcomes  Pt will continue to participate in CR exercise, nutrition, and lifestyle modification opportunities.        ITP Comments: ITP Comments    Row Name 04/21/18 0835 04/27/18 0849 05/14/18 0810 06/04/18 1640 06/29/18 1529   ITP Comments  Medical Director - Dr. Fransico Him, MD  Pt started exercise today and tolerated it well.   30 Day ITP Review.  Patryce is tolerating exercise well.  She has had some absences d/t sickness and appointments.   30 Day ITP Review.  Aileana is tolerating exercise well.  She continues to have intermittent absences d/t sickness and appointments.  She does feel that she is increasing his balance.   30 Day ITP Review.  Alejandrina continues to tolerate exercise.  She feels that she is increasing her strength and stamina.       Comments: See ITP Comments.

## 2018-07-03 ENCOUNTER — Encounter (HOSPITAL_COMMUNITY)
Admission: RE | Admit: 2018-07-03 | Discharge: 2018-07-03 | Disposition: A | Discharge status: Home / Self Care | Payer: PRIVATE HEALTH INSURANCE | Source: Ambulatory Visit | Attending: Cardiology | Admitting: Cardiology

## 2018-07-03 ENCOUNTER — Encounter (HOSPITAL_COMMUNITY): Payer: PRIVATE HEALTH INSURANCE

## 2018-07-03 DIAGNOSIS — I5022 Chronic systolic (congestive) heart failure: Secondary | ICD-10-CM

## 2018-07-03 DIAGNOSIS — I110 Hypertensive heart disease with heart failure: Secondary | ICD-10-CM | POA: Diagnosis not present

## 2018-07-06 ENCOUNTER — Encounter (HOSPITAL_COMMUNITY): Payer: PRIVATE HEALTH INSURANCE

## 2018-07-06 ENCOUNTER — Encounter (HOSPITAL_COMMUNITY)
Admission: RE | Admit: 2018-07-06 | Discharge: 2018-07-06 | Disposition: A | Discharge status: Home / Self Care | Payer: PRIVATE HEALTH INSURANCE | Source: Ambulatory Visit | Attending: Cardiology | Admitting: Cardiology

## 2018-07-06 DIAGNOSIS — I5022 Chronic systolic (congestive) heart failure: Secondary | ICD-10-CM

## 2018-07-06 DIAGNOSIS — I110 Hypertensive heart disease with heart failure: Secondary | ICD-10-CM | POA: Diagnosis not present

## 2018-07-08 ENCOUNTER — Encounter (HOSPITAL_COMMUNITY): Payer: PRIVATE HEALTH INSURANCE

## 2018-07-10 ENCOUNTER — Encounter (HOSPITAL_COMMUNITY): Payer: PRIVATE HEALTH INSURANCE

## 2018-07-13 ENCOUNTER — Encounter (HOSPITAL_COMMUNITY): Payer: PRIVATE HEALTH INSURANCE

## 2018-07-15 ENCOUNTER — Encounter (HOSPITAL_COMMUNITY): Payer: PRIVATE HEALTH INSURANCE

## 2018-07-16 ENCOUNTER — Ambulatory Visit (HOSPITAL_COMMUNITY)
Admission: RE | Admit: 2018-07-16 | Discharge: 2018-07-16 | Disposition: A | Discharge status: Home / Self Care | Payer: PRIVATE HEALTH INSURANCE | Source: Ambulatory Visit | Attending: Cardiology | Admitting: Cardiology

## 2018-07-16 DIAGNOSIS — R0683 Snoring: Secondary | ICD-10-CM | POA: Diagnosis not present

## 2018-07-16 DIAGNOSIS — I5022 Chronic systolic (congestive) heart failure: Secondary | ICD-10-CM | POA: Diagnosis not present

## 2018-07-16 DIAGNOSIS — I110 Hypertensive heart disease with heart failure: Secondary | ICD-10-CM | POA: Insufficient documentation

## 2018-07-16 DIAGNOSIS — G35 Multiple sclerosis: Secondary | ICD-10-CM | POA: Diagnosis not present

## 2018-07-16 DIAGNOSIS — E669 Obesity, unspecified: Secondary | ICD-10-CM | POA: Insufficient documentation

## 2018-07-16 DIAGNOSIS — D259 Leiomyoma of uterus, unspecified: Secondary | ICD-10-CM | POA: Insufficient documentation

## 2018-07-16 DIAGNOSIS — D649 Anemia, unspecified: Secondary | ICD-10-CM | POA: Diagnosis not present

## 2018-07-16 DIAGNOSIS — Z6841 Body Mass Index (BMI) 40.0 and over, adult: Secondary | ICD-10-CM | POA: Insufficient documentation

## 2018-07-16 MED ORDER — POTASSIUM CHLORIDE CRYS ER 20 MEQ PO TBCR: 40.0000 meq | EXTENDED_RELEASE_TABLET | Freq: Every day | ORAL | 5 refills | Status: DC

## 2018-07-16 MED ORDER — ISOSORB DINITRATE-HYDRALAZINE 20-37.5 MG PO TABS: 0.5000 | ORAL_TABLET | Freq: Three times a day (TID) | ORAL | 3 refills | Status: DC

## 2018-07-16 NOTE — Progress Notes (Signed)
HF MD: [facility]  HPI:  Amanda L Broweris a 37 y.o.femalewith a history of multiple sclerosis, anemia due to uterine fibroids, and systolic HF ([id number]).  Admitted to [facility] 5/31-01/13/18 with volume overload and found to have acute systolic heart failure. Echo showed EF 10-15%. HF team consulted. R/LHC completed, which showed no CAD and preserved CO. Cardiac MRI showed no LGE, EF 14%. She was diuresed with IV lasix and transitioned to lasix 40 mg daily. HF meds were optimized. DC weight: 299 lbs.  Today she returns forpharmacist-led HF medication titration.At last HF clinic visit on 11/5, she was started on Bidil 1 tablet TID.She has been doing well since last visit although she has had some pretty sever headaches since starting Bidil. Weightstable at home. No dyspnea walking on flat ground. 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 She reports compliance with her medications but she did NOT take them this am.     Shortness of breath/dyspnea on exertion?no  Orthopnea/PND?no  Edema?no  Lightheadedness/dizziness?No   Daily weights at home?Yes- stable ~300 lb  Blood pressure/heart rate monitoring at home?no  Following low-sodium/fluid-restricted diet?yes  HF Medications: Bidil 1 tablet PO TID Carvedilol 25 mg PO BID Furosemide 20 mg PO daily KCl 40 mEq PO daily Entresto 97-103 mg PO BID Spironolactone 25 mg PO daily  Has the patient been experiencing any side effects to the medications prescribed?Yes - headaches with Bidil  Does the patient have any problems obtaining medications due to transportation or finances?No- [employer] insurance  Understanding of regimen:good Understanding of indications:good Potential of compliance:good Patient understands to avoid NSAIDs. Patient understands to avoid  decongestants.   Pertinent Lab Values: 06/16/2018:Serum creatinine0.74, BUN11, Potassium4.1, Sodium140  Vital Signs:  Weight:305 lb(dry weight: 300 lb)  Blood pressure:138/88 mmHg  Heart rate:80 bpm  Assessment: 1. Chronicsystolic CHF ([id number]%>>56%), due toNICM (?viral). NYHA classIIsymptoms. - Volume status stable - Decrease Bidil to 1/2 tablet TID with severe headaches. If she still has headaches on this dose, I advised her to take APAP 30-60 mins prior to the first dose of the day to see if this helps   -- If headache improves on lower dose, she may attempt to titrate back up to 1 tab TID - Continue carvedilol 25 mg BID, Entresto 97-103 mg BID, furosemide 20 mg daily, KCl 40 meq daily, and spironolactone 25 mg daily - EF has improved some onmost recentecho. Repeat echo in 3 more months. If EF >35%, can avoid ICD. Narrow QRS so not CRT candidate. - Basic disease state pathophysiology, medication indication, mechanism and side effects reviewed at length with patient andshe verbalized understanding  2. HTN - BP above goal today of <130/80 mmHg but she did not take any of her medications this morning and BP has been wnl at cardiac rehab - Meds as above  3. History of uterine fibroids with bleeding/anemia:Stable currently.  4. Multiple sclerosis: Quiescent currently. Aubagio, her MS medication, does not appear to have CHF as a side effect. Only cardiac side effect appears to be HTN.  5. Snores:Set up for sleep study  6. Obesity:We discussed weight loss with diet and exercise. Body mass index is 48.16 kg/m.  Plan: 1) Medication changes: Based on clinical presentation, vital signs and recent labs willdecrease Bidil to 1/2 tablet TID 2) Labs:PRN 3) Follow-up:Dr. Aundra Dubin on 09/15/18    Amanda [name]. Velva Harman, PharmD, BCPS, CPP  Clinical Pharmacist Phone: [phone] 07/16/2018 9:06 AM

## 2018-07-16 NOTE — Patient Instructions (Addendum)
It was great to see you today!  Please DECREASE your Bidil to 1/2 tablet THREE TIMES DAILY. If the headaches improve on this dose, you may try to increase the dose back up to 1 tablet THREE TIMES DAILY.   Please keep your appointment with Dr. Aundra Dubin on 09/15/18.

## 2018-07-17 ENCOUNTER — Encounter (HOSPITAL_COMMUNITY): Payer: PRIVATE HEALTH INSURANCE

## 2018-07-20 ENCOUNTER — Encounter (HOSPITAL_COMMUNITY): Payer: PRIVATE HEALTH INSURANCE

## 2018-07-20 ENCOUNTER — Telehealth (HOSPITAL_COMMUNITY): Payer: Self-pay | Admitting: *Deleted

## 2018-07-20 NOTE — Telephone Encounter (Signed)
Return call to patient.  Pt has been experiencing side effects from addition of Bidil.  Pt experiencing headaches and generalized pain.  She states that she was asked to half her dose and trial this new dose for a couple of days.  Amanda [name] endorses that the symptoms still persist with the lowered dose.  Encouraged patient to call MD office.  Amanda [name] plans to "give it the rest of the day and see how I feel."  She plans to call tomorrow if the symptoms continue to persist. Amanda [name] hopes to return to Cardiac rehab Wednesday, 07/22/18 or Friday, 07/24/18.

## 2018-07-20 NOTE — Telephone Encounter (Signed)
Called pt to inquire about absences from CR.  No answer. VM left and included information about patient's last day of CR, 07/29/18.

## 2018-07-21 ENCOUNTER — Telehealth (HOSPITAL_COMMUNITY): Payer: Self-pay | Admitting: Pharmacist

## 2018-07-21 NOTE — Telephone Encounter (Signed)
Amanda [name] called stating that even on Bidil 1/2 tab TID, she is still having headaches. I have advised her to try APAP about 30-60 mins prior to first dose of the day to see if this helps. She will call back if this does not help.   [name], PharmD, BCPS, CPP Clinical Pharmacist Phone: [phone] 07/21/2018 2:09 PM

## 2018-07-22 ENCOUNTER — Encounter (HOSPITAL_COMMUNITY)
Admission: RE | Admit: 2018-07-22 | Discharge: 2018-07-22 | Disposition: A | Discharge status: Home / Self Care | Payer: PRIVATE HEALTH INSURANCE | Source: Ambulatory Visit | Attending: Cardiology | Admitting: Cardiology

## 2018-07-22 ENCOUNTER — Encounter (HOSPITAL_COMMUNITY): Payer: PRIVATE HEALTH INSURANCE

## 2018-07-22 ENCOUNTER — Encounter (HOSPITAL_COMMUNITY): Payer: Self-pay

## 2018-07-22 DIAGNOSIS — I5022 Chronic systolic (congestive) heart failure: Secondary | ICD-10-CM | POA: Insufficient documentation

## 2018-07-22 DIAGNOSIS — Z79899 Other long term (current) drug therapy: Secondary | ICD-10-CM | POA: Insufficient documentation

## 2018-07-22 DIAGNOSIS — I110 Hypertensive heart disease with heart failure: Secondary | ICD-10-CM | POA: Insufficient documentation

## 2018-07-22 DIAGNOSIS — D649 Anemia, unspecified: Secondary | ICD-10-CM | POA: Insufficient documentation

## 2018-07-22 DIAGNOSIS — G35 Multiple sclerosis: Secondary | ICD-10-CM | POA: Insufficient documentation

## 2018-07-23 NOTE — Progress Notes (Signed)
Cardiac Individual Treatment Plan  Patient Details  Name: Amanda [name] MRN: [id number] Date of Birth: 1980-09-26 Referring Provider:     CARDIAC REHAB PHASE II ORIENTATION from 04/21/2018 in [place]  Referring Provider  Loralie Champagne, MD      Initial Encounter Date:    CARDIAC REHAB PHASE II ORIENTATION from 04/21/2018 in [place]  Date  04/21/18      Visit Diagnosis: Heart failure, chronic systolic (HCC)  Patient's Home Medications on Admission:  Current Outpatient Medications:  .  carvedilol (COREG) 25 MG tablet, Take 1 tablet (25 mg total) by mouth 2 (two) times daily with a meal., Disp: 60 tablet, Rfl: 5 .  ferrous sulfate 325 (65 FE) MG tablet, Take 1 tablet (325 mg total) by mouth daily with breakfast., Disp: 30 tablet, Rfl: 3 .  furosemide (LASIX) 40 MG tablet, Take 0.5 tablets (20 mg total) by mouth daily., Disp: 15 tablet, Rfl: 5 .  ibuprofen (ADVIL,MOTRIN) 200 MG tablet, Take 200 mg by mouth every 6 (six) hours as needed for headache., Disp: , Rfl:  .  isosorbide-hydrALAZINE (BIDIL) 20-37.5 MG tablet, Take 0.5 tablets by mouth 3 (three) times daily., Disp: 45 tablet, Rfl: 3 .  potassium chloride SA (K-DUR,KLOR-CON) 20 MEQ tablet, Take 2 tablets (40 mEq total) by mouth daily., Disp: 60 tablet, Rfl: 5 .  sacubitril-valsartan (ENTRESTO) 97-103 MG, Take 1 tablet by mouth 2 (two) times daily., Disp: 60 tablet, Rfl: 5 .  spironolactone (ALDACTONE) 25 MG tablet, Take 1 tablet (25 mg total) by mouth daily., Disp: 30 tablet, Rfl: 6 .  Teriflunomide (AUBAGIO) 14 MG TABS, Take 14 mg by mouth daily., Disp: , Rfl:   Past Medical History: Past Medical History:  Diagnosis Date  . Anemia   . BP (high blood pressure)   . CHF (congestive heart failure) ([place])   . History of blood transfusion 08/2017   "related to low blood count"   . Multiple sclerosis ([place])   . Pneumonia 12/2017    Tobacco Use: Social History    Tobacco Use  Smoking Status Never Smoker  Smokeless Tobacco Never Used    Labs: Recent Review Scientist, physiological    Labs for ITP Cardiac and Pulmonary Rehab Latest Ref Rng & Units 01/12/2018 01/12/2018   HCO3 20.0 - 28.0 mmol/L 25.4 25.2   TCO2 22 - 32 mmol/L 27 26   O2SAT % 67.0 64.0      Capillary Blood Glucose: No results found for: GLUCAP   Exercise Target Goals: Exercise Program Goal: Individual exercise prescription set using results from initial 6 min walk test and THRR while considering  patient's activity barriers and safety.   Exercise Prescription Goal: Initial exercise prescription builds to 30-45 minutes a day of aerobic activity, 2-3 days per week.  Home exercise guidelines will be given to patient during program as part of exercise prescription that the participant will acknowledge.  Activity Barriers & Risk Stratification: Activity Barriers & Cardiac Risk Stratification - 04/21/18 0838      Activity Barriers & Cardiac Risk Stratification   Activity Barriers  Balance Concerns   Balance concerns secondary to multiple sclerosis   Cardiac Risk Stratification  High       6 Minute Walk: 6 Minute Walk    Row Name 04/21/18 0822         6 Minute Walk   Phase  Initial     Distance  1340 feet  Walk Time  6 minutes     # of Rest Breaks  0     MPH  2.54     METS  3.94     RPE  11     Perceived Dyspnea   3     VO2 Peak  13.78     Symptoms  Yes (comment)     Comments  Dyspnea     Resting HR  95 bpm     Resting BP  98/60     Resting Oxygen Saturation   98 %     Exercise Oxygen Saturation  during 6 min walk  98 %     Max Ex. HR  130 bpm     Max Ex. BP  120/70     2 Minute Post BP  108/60        Oxygen Initial Assessment:   Oxygen Re-Evaluation:   Oxygen Discharge (Final Oxygen Re-Evaluation):   Initial Exercise Prescription: Initial Exercise Prescription - 04/21/18 1000      Date of Initial Exercise RX and Referring Provider   Date  04/21/18     Referring Provider  Loralie Champagne, MD      Treadmill   MPH  1.7    Grade  0    Minutes  10    METs  2.3      Recumbant Bike   Level  2    Watts  25    Minutes  10    METs  2.58      NuStep   Level  2    SPM  85    Minutes  10    METs  2.5      Track   Laps  10    Minutes  10    METs  2.74      Prescription Details   Frequency (times per week)  3    Duration  Progress to 30 minutes of continuous aerobic without signs/symptoms of physical distress      Intensity   THRR 40-80% of Max Heartrate  74-148    Ratings of Perceived Exertion  11-13    Perceived Dyspnea  0-4      Progression   Progression  Continue to progress workloads to maintain intensity without signs/symptoms of physical distress.      Resistance Training   Training Prescription  Yes    Weight  3lbs       Perform Capillary Blood Glucose checks as needed.  Exercise Prescription Changes: Exercise Prescription Changes    Row Name 04/27/18 0800 05/13/18 1600 05/22/18 1624 06/05/18 0846 06/22/18 1336     Response to Exercise   Blood Pressure (Admit)  120/70  110/62  120/78  108/60  114/78   Blood Pressure (Exercise)  118/60  122/64  118/78  134/74  122/64   Blood Pressure (Exit)  101/70  110/58  104/60  108/60  102/60   Heart Rate (Admit)  99 bpm  99 bpm  94 bpm  100 bpm  95 bpm   Heart Rate (Exercise)  129 bpm  134 bpm  123 bpm  136 bpm  143 bpm   Heart Rate (Exit)  87 bpm  83 bpm  91 bpm  92 bpm  95 bpm   Rating of Perceived Exertion (Exercise)  12  12  12  12  12    Perceived Dyspnea (Exercise)  0  0  0  0  0   Symptoms  None  None  None  None  None   Comments  Pt oriented to exercise equipment   -  None  None  None   Duration  Progress to 30 minutes of  aerobic without signs/symptoms of physical distress  Continue with 30 min of aerobic exercise without signs/symptoms of physical distress.  Continue with 30 min of aerobic exercise without signs/symptoms of physical distress.  Progress to 45  minutes of aerobic exercise without signs/symptoms of physical distress  Progress to 45 minutes of aerobic exercise without signs/symptoms of physical distress   Intensity  THRR New  THRR unchanged  THRR unchanged  THRR unchanged  THRR unchanged     Progression   Progression  Continue to progress workloads to maintain intensity without signs/symptoms of physical distress.  Continue to progress workloads to maintain intensity without signs/symptoms of physical distress.  Continue to progress workloads to maintain intensity without signs/symptoms of physical distress.  Continue to progress workloads to maintain intensity without signs/symptoms of physical distress.  Continue to progress workloads to maintain intensity without signs/symptoms of physical distress.   Average METs  2.2  2.08  2.7  2.9  2.6     Resistance Training   Training Prescription  Yes  No  Yes  Yes  Yes   Weight  3lbs  -  4lbs  4lbs  5lbs   Reps  10-15  -  10-15  10-15  10-15   Time  10 Minutes  -  10 Minutes  10 Minutes  10 Minutes     Interval Training   Interval Training  -  No  No  No  No     Treadmill   MPH  1.9  1.9  2.1  2.1  2.1   Grade  0  0  1  1  1    Minutes  10  10  10  10  10    METs  2.45  2.45  2.9  2.9  2.9     Recumbant Bike   Level  2  2  3  3  3    Watts  25  20  20  20  20    Minutes  10  10  10  10  10    METs  2.58  1.7  1.7  1.7  1.7     NuStep   Level  2  3  3  5  5    SPM  85  85  85  95  95   Minutes  10  10  10  10  10    METs  1.9  2.1  3.5  4.1  3.1     Home Exercise Plan   Plans to continue exercise at  -  -  Home (comment) Walking  Home (comment) Walking  Home (comment) Walking   Frequency  -  -  Add 2 additional days to program exercise sessions.  Add 2 additional days to program exercise sessions.  Add 2 additional days to program exercise sessions.   Initial Home Exercises Provided  -  -  05/18/18  05/18/18  05/18/18   Row Name 06/29/18 1000 07/06/18 0959 07/22/18 0900          Response to Exercise   Blood Pressure (Admit)  104/62  112/80  92/78     Blood Pressure (Exercise)  118/68  134/66  120/62     Blood Pressure (Exit)  102/60  112/70  108/60     Heart Rate (Admit)  89 bpm  90 bpm  98 bpm     Heart Rate (Exercise)  130 bpm  138 bpm  141 bpm     Heart Rate (Exit)  89 bpm  73 bpm  101 bpm     Rating of Perceived Exertion (Exercise)  12  13  12      Perceived Dyspnea (Exercise)  0  0  0     Symptoms  None  None  Pt reported fatigue due to new medication added     Comments  None  None  Pt first session since 11/25     Duration  Progress to 45 minutes of aerobic exercise without signs/symptoms of physical distress  Progress to 45 minutes of aerobic exercise without signs/symptoms of physical distress  Progress to 45 minutes of aerobic exercise without signs/symptoms of physical distress     Intensity  THRR unchanged  THRR unchanged  THRR unchanged       Progression   Progression  Continue to progress workloads to maintain intensity without signs/symptoms of physical distress.  Continue to progress workloads to maintain intensity without signs/symptoms of physical distress.  Continue to progress workloads to maintain intensity without signs/symptoms of physical distress.     Average METs  3  3  3.12       Resistance Training   Training Prescription  Yes  Yes  No     Weight  5lbs  5lbs  -     Reps  10-15  10-15  -     Time  10 Minutes  10 Minutes  -       Interval Training   Interval Training  No  No  -       Treadmill   MPH  2.1  2.1  2.4     Grade  1  2  2      Minutes  10  10  10      METs  2.9  3.5  3.2       Recumbant Bike   Level  2 Upright SciFit Bike  2 Upright SciFit Bike  1     Minutes  10  10  10      METs  3  3.2  3       NuStep   Level  5  5  5      SPM  95  95  90     Minutes  10  10  10      METs  3.1  2.9  3       Home Exercise Plan   Plans to continue exercise at  Home (comment) Walking  Home (comment) Walking  Home (comment) Walking      Frequency  Add 2 additional days to program exercise sessions.  Add 2 additional days to program exercise sessions.  Add 2 additional days to program exercise sessions.     Initial Home Exercises Provided  05/18/18  05/18/18  05/18/18        Exercise Comments: Exercise Comments    Row Name 04/27/18 0810 05/22/18 1628 05/25/18 1627 06/29/18 1023 07/22/18 0914   Exercise Comments  Pt oriented exercise equipment. Pt tolerated workloads well. Will continue to monitor and progress pt as tolerated.   Reviewed HEP with pt. Will continue to monitor and progress pt as tolerated.   -  Reviewed METs and Goals with pt. Pt is continuing to put forth great effort with exercise. Will continue to monitor pt's progression.  Pt  has been out of rehab for 2 weeks due to adverse side effects of a new medication. Pt returned today, states she is still not feeling well, but able to exercise. Reviewed METs and Goals with pt. Will continue to monitor.       Exercise Goals and Review: Exercise Goals    Row Name 04/21/18 0902             Exercise Goals   Increase Physical Activity  Yes       Intervention  Provide advice, education, support and counseling about physical activity/exercise needs.;Develop an individualized exercise prescription for aerobic and resistive training based on initial evaluation findings, risk stratification, comorbidities and participant's personal goals.       Expected Outcomes  Short Term: Attend rehab on a regular basis to increase amount of physical activity.;Long Term: Exercising regularly at least 3-5 days a week.;Long Term: Add in home exercise to make exercise part of routine and to increase amount of physical activity.       Increase Strength and Stamina  Yes       Intervention  Provide advice, education, support and counseling about physical activity/exercise needs.;Develop an individualized exercise prescription for aerobic and resistive training based on initial evaluation  findings, risk stratification, comorbidities and participant's personal goals.       Expected Outcomes  Short Term: Increase workloads from initial exercise prescription for resistance, speed, and METs.;Short Term: Perform resistance training exercises routinely during rehab and add in resistance training at home;Long Term: Improve cardiorespiratory fitness, muscular endurance and strength as measured by increased METs and functional capacity (6MWT)       Able to understand and use rate of perceived exertion (RPE) scale  Yes       Intervention  Provide education and explanation on how to use RPE scale       Expected Outcomes  Short Term: Able to use RPE daily in rehab to express subjective intensity level;Long Term:  Able to use RPE to guide intensity level when exercising independently       Knowledge and understanding of Target Heart Rate Range (THRR)  Yes       Intervention  Provide education and explanation of THRR including how the numbers were predicted and where they are located for reference       Expected Outcomes  Short Term: Able to state/look up THRR;Long Term: Able to use THRR to govern intensity when exercising independently;Short Term: Able to use daily as guideline for intensity in rehab       Able to check pulse independently  Yes       Intervention  Provide education and demonstration on how to check pulse in carotid and radial arteries.;Review the importance of being able to check your own pulse for safety during independent exercise       Expected Outcomes  Short Term: Able to explain why pulse checking is important during independent exercise;Long Term: Able to check pulse independently and accurately       Understanding of Exercise Prescription  Yes       Intervention  Provide education, explanation, and written materials on patient's individual exercise prescription       Expected Outcomes  Short Term: Able to explain program exercise prescription;Long Term: Able to explain home  exercise prescription to exercise independently          Exercise Goals Re-Evaluation : Exercise Goals Re-Evaluation    Row Name 05/22/18 1629 06/29/18 1024 07/22/18 4259  Exercise Goal Re-Evaluation   Exercise Goals Review  Increase Physical Activity;Able to understand and use rate of perceived exertion (RPE) scale;Knowledge and understanding of Target Heart Rate Range (THRR);Understanding of Exercise Prescription;Increase Strength and Stamina;Able to check pulse independently  Increase Physical Activity;Understanding of Exercise Prescription  Increase Physical Activity;Understanding of Exercise Prescription     Comments  Reviewed HEP with pt. Also reviewed THRR, RPE Scale, weather conditions, NTG use, endpoints of exercise, warmup and cool down.   Reviewed METs and Goals with pt. Added Upright Recumbent bike to pt's exercise prescription. Pt states this bike feels better than the regular recumbent bike. Pt puts forth great effort and is able to exercise 30 minutes with no issues.   Pt is continuing to respond well to exericse prescription. Pt put forth great effort today despite not feeling well. Will continue to work with pt to increase strength and stamina.      Expected Outcomes  Pt will plan to continue to walk 2-3 days a week for 5 minutes. Pt will gradually increase walking time till she gets 30 minutes. Pt will continue to increase cardiovascular fitness. Will continue to monitor pt.   Pt will continue to walk 2-3 days a week for 10-15 minutes. Pt will continue to increase cardiovascular fitness. Will continue to monitor.   Pt will continue to walk at home 2 days a week for 30 minutes. Pt will continue to increase cardiovascular capacity. Will continue to monitor and progress pt.          Discharge Exercise Prescription (Final Exercise Prescription Changes): Exercise Prescription Changes - 07/22/18 0900      Response to Exercise   Blood Pressure (Admit)  92/78    Blood Pressure  (Exercise)  120/62    Blood Pressure (Exit)  108/60    Heart Rate (Admit)  98 bpm    Heart Rate (Exercise)  141 bpm    Heart Rate (Exit)  101 bpm    Rating of Perceived Exertion (Exercise)  12    Perceived Dyspnea (Exercise)  0    Symptoms  Pt reported fatigue due to new medication added    Comments  Pt first session since 11/25    Duration  Progress to 45 minutes of aerobic exercise without signs/symptoms of physical distress    Intensity  THRR unchanged      Progression   Progression  Continue to progress workloads to maintain intensity without signs/symptoms of physical distress.    Average METs  3.12      Resistance Training   Training Prescription  No      Treadmill   MPH  2.4    Grade  2    Minutes  10    METs  3.2      Recumbant Bike   Level  1    Minutes  10    METs  3      NuStep   Level  5    SPM  90    Minutes  10    METs  3      Home Exercise Plan   Plans to continue exercise at  Home (comment)   Walking   Frequency  Add 2 additional days to program exercise sessions.    Initial Home Exercises Provided  05/18/18       Nutrition:  Target Goals: Understanding of nutrition guidelines, daily intake of sodium 1500mg , cholesterol 200mg , calories 30% from fat and 7% or less from saturated fats, daily to  have 5 or more servings of fruits and vegetables.  Biometrics: Pre Biometrics - 04/21/18 1029      Pre Biometrics   Height  5\' 7"  (1.702 m)    Weight  (!) 136.1 kg    Waist Circumference  42 inches    Hip Circumference  56.25 inches    Waist to Hip Ratio  0.75 %    BMI (Calculated)  46.98    Triceps Skinfold  49 mm    % Body Fat  51.6 %    Grip Strength  31.5 kg    Flexibility  0 in    Single Leg Stand  3.41 seconds        Nutrition Therapy Plan and Nutrition Goals: Nutrition Therapy & Goals - 04/21/18 0851      Nutrition Therapy   Diet  heart healthy      Personal Nutrition Goals   Nutrition Goal  Pt to identify and limit food sources of  saturated fat, trans fat, refined carbohydrates and sodium    Personal Goal #2  Pt to identify food quantities necessary to achieve weight loss of 6-24 lbs. at graduation from cardiac rehab      [place], educate and counsel regarding individualized specific dietary modifications aiming towards targeted core components such as weight, hypertension, lipid management, diabetes, heart failure and other comorbidities.    Expected Outcomes  Short Term Goal: Understand basic principles of dietary content, such as calories, fat, sodium, cholesterol and nutrients.;Long Term Goal: Adherence to prescribed nutrition plan.       Nutrition Assessments: Nutrition Assessments - 04/21/18 0852      MEDFICTS Scores   Pre Score  62       Nutrition Goals Re-Evaluation: Nutrition Goals Re-Evaluation    [place] Name 04/21/18 0851             Goals   Current Weight  300 lb 0.7 oz (136.1 kg)          Nutrition Goals Re-Evaluation: Nutrition Goals Re-Evaluation    [place] Name 04/21/18 0851             Goals   Current Weight  300 lb 0.7 oz (136.1 kg)          Nutrition Goals Discharge (Final Nutrition Goals Re-Evaluation): Nutrition Goals Re-Evaluation - 04/21/18 0851      Goals   Current Weight  300 lb 0.7 oz (136.1 kg)       Psychosocial: Target Goals: Acknowledge presence or absence of significant depression and/or stress, maximize coping skills, provide positive support system. Participant is able to verbalize types and ability to use techniques and skills needed for reducing stress and depression.  Initial Review & Psychosocial Screening: Initial Psych Review & Screening - 04/21/18 1020      Initial Review   Current issues with  None Identified;Current Stress Concerns    Source of Stress Concerns  Chronic Illness;Unable to perform yard/household activities;Financial      Family Dynamics   Good Support System?  Yes   husband      Barriers    Psychosocial barriers to participate in program  There are no identifiable barriers or psychosocial needs.      Screening Interventions   Interventions  Encouraged to exercise    Expected Outcomes  Short Term goal: Utilizing psychosocial counselor, staff and physician to assist with identification of specific Stressors or current issues interfering with healing process. Setting desired goal for each  stressor or current issue identified.;Long Term Goal: Stressors or current issues are controlled or eliminated.;Short Term goal: Identification and review with participant of any Quality of Life or Depression concerns found by scoring the questionnaire.;Long Term goal: The participant improves quality of Life and PHQ9 Scores as seen by post scores and/or verbalization of changes       Quality of Life Scores: Quality of Life - 04/21/18 1021      Quality of Life   Select  Quality of Life      Quality of Life Scores   Health/Function Pre  18.07 %    Socioeconomic Pre  16.56 %    Psych/Spiritual Pre  23.1 %    Family Pre  21.6 %    GLOBAL Pre  19.24 %      Scores of 19 and below usually indicate a poorer quality of life in these areas.  A difference of  2-3 points is a clinically meaningful difference.  A difference of 2-3 points in the total score of the Quality of Life Index has been associated with significant improvement in overall quality of life, self-image, physical symptoms, and general health in studies assessing change in quality of life.  PHQ-9: Recent Review Flowsheet Data    Depression screen [facility] 2/9 04/27/2018   Decreased Interest 0   Down, Depressed, Hopeless 0   PHQ - 2 Score 0     Interpretation of Total Score  Total Score Depression Severity:  1-4 = Minimal depression, 5-9 = Mild depression, 10-14 = Moderate depression, 15-19 = Moderately severe depression, 20-27 = Severe depression   Psychosocial Evaluation and Intervention: Psychosocial Evaluation - 04/27/18 0849       Psychosocial Evaluation & Interventions   Interventions  Encouraged to exercise with the program and follow exercise prescription;Stress management education;Relaxation education    Comments  Amanda [name] with some stress concerns related to her chronic illness.     Expected Outcomes  Amanda [name] will report decreased stress related to her chronic illness and report feeling the ability to manage this illness.     Continue Psychosocial Services   Follow up required by staff       Psychosocial Re-Evaluation: Psychosocial Re-Evaluation    Row Name 05/14/18 0811 06/04/18 1641 06/29/18 1529 07/22/18 1534       Psychosocial Re-Evaluation   Current issues with  Current Stress Concerns  Current Stress Concerns  Current Stress Concerns  Current Stress Concerns    Comments  Encouraged attendance of stress management class to learn techniques.   Encouraged attendance of stress management class to learn techniques.   Encouraged attendance of stress management class to learn techniques.   Encouraged attendance of stress management class to learn techniques.     Expected Outcomes  Amanda [name] will report ability to manage her stress with good coping skills.   [place] will report ability to manage her stress with good coping skills.   Amanda [name] will report ability to manage her stress with good coping skills.   Amanda [name] will report ability to manage her stress with good coping skills.     Interventions  Stress management education;Relaxation education;Encouraged to attend Cardiac Rehabilitation for the exercise  Stress management education;Relaxation education;Encouraged to attend Cardiac Rehabilitation for the exercise  Stress management education;Relaxation education;Encouraged to attend Cardiac Rehabilitation for the exercise  Stress management education;Relaxation education;Encouraged to attend Cardiac Rehabilitation for the exercise    Continue Psychosocial Services   Follow up required by staff  Follow up required by staff  Follow up  required by staff  Follow up required by staff       Psychosocial Discharge (Final Psychosocial Re-Evaluation): Psychosocial Re-Evaluation - 07/22/18 1534      Psychosocial Re-Evaluation   Current issues with  Current Stress Concerns    Comments  Encouraged attendance of stress management class to learn techniques.     Expected Outcomes  Amanda [name] will report ability to manage her stress with good coping skills.     Interventions  Stress management education;Relaxation education;Encouraged to attend Cardiac Rehabilitation for the exercise    Continue Psychosocial Services   Follow up required by staff       Vocational Rehabilitation: Provide vocational rehab assistance to qualifying candidates.   Vocational Rehab Evaluation & Intervention: Vocational Rehab - 04/27/18 0859      Vocational Rehab Re-[facility]   Comments  Paperwork given to Amanda [name] 04/27/18.       Education: Education Goals: Education classes will be provided on a weekly basis, covering required topics. Participant will state understanding/return demonstration of topics presented.  Learning Barriers/Preferences: Learning Barriers/Preferences - 04/21/18 0837      Learning Barriers/Preferences   Learning Barriers  None    Learning Preferences  None       Education Topics: Count Your Pulse:  -Group instruction provided by verbal instruction, demonstration, patient participation and written materials to support subject.  Instructors address importance of being able to find your pulse and how to count your pulse when at home without a heart monitor.  Patients get hands on experience counting their pulse with staff help and individually.   Heart Attack, Angina, and Risk Factor Modification:  -Group instruction provided by verbal instruction, video, and written materials to support subject.  Instructors address signs and symptoms of angina and heart attacks.    Also discuss risk factors for heart disease and how to make  changes to improve heart health risk factors.   Functional Fitness:  -Group instruction provided by verbal instruction, demonstration, patient participation, and written materials to support subject.  Instructors address safety measures for doing things around the house.  Discuss how to get up and down off the floor, how to pick things up properly, how to safely get out of a chair without assistance, and balance training.   Meditation and Mindfulness:  -Group instruction provided by verbal instruction, patient participation, and written materials to support subject.  Instructor addresses importance of mindfulness and meditation practice to help reduce stress and improve awareness.  Instructor also leads participants through a meditation exercise.    Stretching for Flexibility and Mobility:  -Group instruction provided by verbal instruction, patient participation, and written materials to support subject.  Instructors lead participants through series of stretches that are designed to increase flexibility thus improving mobility.  These stretches are additional exercise for major muscle groups that are typically performed during regular warm up and cool down.   Hands Only CPR:  -Group verbal, video, and participation provides a basic overview of AHA guidelines for community CPR. Role-play of emergencies allow participants the opportunity to practice calling for help and chest compression technique with discussion of AED use.   Hypertension: -Group verbal and written instruction that provides a basic overview of hypertension including the most recent diagnostic guidelines, risk factor reduction with self-care instructions and medication management.    Nutrition I class: Heart Healthy Eating:  -Group instruction provided by PowerPoint slides, verbal discussion, and written materials to support subject matter. The instructor gives an explanation and review  of the Therapeutic Lifestyle Changes  diet recommendations, which includes a discussion on lipid goals, dietary fat, sodium, fiber, plant stanol/sterol esters, sugar, and the components of a well-balanced, healthy diet.   Nutrition II class: Lifestyle Skills:  -Group instruction provided by PowerPoint slides, verbal discussion, and written materials to support subject matter. The instructor gives an explanation and review of label reading, grocery shopping for heart health, heart healthy recipe modifications, and ways to make healthier choices when eating out.   Diabetes Question & Answer:  -Group instruction provided by PowerPoint slides, verbal discussion, and written materials to support subject matter. The instructor gives an explanation and review of diabetes co-morbidities, pre- and post-prandial blood glucose goals, pre-exercise blood glucose goals, signs, symptoms, and treatment of hypoglycemia and hyperglycemia, and foot care basics.   Diabetes Blitz:  -Group instruction provided by PowerPoint slides, verbal discussion, and written materials to support subject matter. The instructor gives an explanation and review of the physiology behind type 1 and type 2 diabetes, diabetes medications and rational behind using different medications, pre- and post-prandial blood glucose recommendations and Hemoglobin A1c goals, diabetes diet, and exercise including blood glucose guidelines for exercising safely.    Portion Distortion:  -Group instruction provided by PowerPoint slides, verbal discussion, written materials, and food models to support subject matter. The instructor gives an explanation of serving size versus portion size, changes in portions sizes over the last 20 years, and what consists of a serving from each food group.   Stress Management:  -Group instruction provided by verbal instruction, video, and written materials to support subject matter.  Instructors review role of stress in heart disease and how to cope with  stress positively.     Exercising on Your Own:  -Group instruction provided by verbal instruction, power point, and written materials to support subject.  Instructors discuss benefits of exercise, components of exercise, frequency and intensity of exercise, and end points for exercise.  Also discuss use of nitroglycerin and activating EMS.  Review options of places to exercise outside of rehab.  Review guidelines for sex with heart disease.   Cardiac Drugs I:  -Group instruction provided by verbal instruction and written materials to support subject.  Instructor reviews cardiac drug classes: antiplatelets, anticoagulants, beta blockers, and statins.  Instructor discusses reasons, side effects, and lifestyle considerations for each drug class.   Cardiac Drugs II:  -Group instruction provided by verbal instruction and written materials to support subject.  Instructor reviews cardiac drug classes: angiotensin converting enzyme inhibitors (ACE-I), angiotensin II receptor blockers (ARBs), nitrates, and calcium channel blockers.  Instructor discusses reasons, side effects, and lifestyle considerations for each drug class.   Anatomy and Physiology of the Circulatory System:  Group verbal and written instruction and models provide basic cardiac anatomy and physiology, with the coronary electrical and arterial systems. Review of: AMI, Angina, Valve disease, Heart Failure, Peripheral Artery Disease, Cardiac Arrhythmia, Pacemakers, and the ICD.   Other Education:  -Group or individual verbal, written, or video instructions that support the educational goals of the cardiac rehab program.   Holiday Eating Survival Tips:  -Group instruction provided by PowerPoint slides, verbal discussion, and written materials to support subject matter. The instructor gives patients tips, tricks, and techniques to help them not only survive but enjoy the holidays despite the onslaught of food that accompanies the  holidays.   Knowledge Questionnaire Score: Knowledge Questionnaire Score - 04/21/18 1023      Knowledge Questionnaire Score   Pre Score  20/24  Core Components/Risk Factors/Patient Goals at Admission: Personal Goals and Risk Factors at Admission - 04/21/18 1040      Core Components/Risk Factors/Patient Goals on Admission    Weight Management  Obesity;Yes    Intervention  Weight Management/Obesity: Establish reasonable short term and long term weight goals.;Obesity: Provide education and appropriate resources to help participant work on and attain dietary goals.    Admit Weight  300 lb 0.7 oz (136.1 kg)    Goal Weight: Short Term  294 lb (133.4 kg)    Goal Weight: Long Term  200 lb (90.7 kg)    Expected Outcomes  Short Term: Continue to assess and modify interventions until short term weight is achieved;Long Term: Adherence to nutrition and physical activity/exercise program aimed toward attainment of established weight goal;Weight Loss: Understanding of general recommendations for a balanced deficit meal plan, which promotes 1-2 lb weight loss per week and includes a negative energy balance of [phone] kcal/d;Understanding recommendations for meals to include 15-35% energy as protein, 25-35% energy from fat, 35-60% energy from carbohydrates, less than 200mg  of dietary cholesterol, 20-35 gm of total fiber daily;Understanding of distribution of calorie intake throughout the day with the consumption of 4-5 meals/snacks    Heart Failure  Yes    Intervention  Provide a combined exercise and nutrition program that is supplemented with education, support and counseling about heart failure. Directed toward relieving symptoms such as shortness of breath, decreased exercise tolerance, and extremity edema.    Expected Outcomes  Improve functional capacity of life;Short term: Attendance in program 2-3 days a week with increased exercise capacity. Reported lower sodium intake. Reported increased fruit  and vegetable intake. Reports medication compliance.;Short term: Daily weights obtained and reported for increase. Utilizing diuretic protocols set by physician.;Long term: Adoption of self-care skills and reduction of barriers for early signs and symptoms recognition and intervention leading to self-care maintenance.    Hypertension  Yes    Intervention  Provide education on lifestyle modifcations including regular physical activity/exercise, weight management, moderate sodium restriction and increased consumption of fresh fruit, vegetables, and low fat dairy, alcohol moderation, and smoking cessation.;Monitor prescription use compliance.    Expected Outcomes  Short Term: Continued assessment and intervention until BP is < 140/36mm HG in hypertensive participants. < 130/78mm HG in hypertensive participants with diabetes, heart failure or chronic kidney disease.;Long Term: Maintenance of blood pressure at goal levels.       Core Components/Risk Factors/Patient Goals Review:  Goals and Risk Factor Review    Row Name 04/27/18 0900 05/14/18 4193 06/04/18 1641 06/29/18 1530 07/22/18 1534     Core Components/Risk Factors/Patient Goals Review   Personal Goals Review  Weight Management/Obesity;Hypertension;Heart Failure  Weight Management/Obesity;Hypertension;Heart Failure  Weight Management/Obesity;Hypertension;Heart Failure  Weight Management/Obesity;Hypertension;Heart Failure  Weight Management/Obesity;Hypertension;Heart Failure   Review  Pt with multiple CAD RFs willing to participate in CR exercise.  Talullah would like to increase her heart strength.  Pt with multiple CAD RFs willing to participate in CR exercise.  Amanda [name] is tolerating exercise well.  VSS.  Pt with multiple CAD RFs willing to participate in CR exercise.  Amanda [name] is tolerating exercise well.  She continues to have intermittent absences but reports increased motivation and balance.   Pt with multiple CAD RFs willing to participate in CR  exercise.  Amanda [name] continues to tolerate exercise well. She feels that she is increasing her strength and stamina.   Pt with multiple CAD RFs willing to participate in CR exercise.  Amanda [name] has had some  absences d/t side effects from a new medication.  She was able to resume exercise today with decreasing the dose.    Expected Outcomes  Pt will continue to participate in CR exercise, nutrition, and lifestyle modification opportunities.   Pt will continue to participate in CR exercise, nutrition, and lifestyle modification opportunities.   Pt will continue to participate in CR exercise, nutrition, and lifestyle modification opportunities.   Pt will continue to participate in CR exercise, nutrition, and lifestyle modification opportunities.   Pt will continue to participate in CR exercise, nutrition, and lifestyle modification opportunities.       Core Components/Risk Factors/Patient Goals at Discharge (Final Review):  Goals and Risk Factor Review - 07/22/18 1534      Core Components/Risk Factors/Patient Goals Review   Personal Goals Review  Weight Management/Obesity;Hypertension;Heart Failure    Review  Pt with multiple CAD RFs willing to participate in CR exercise.  Amanda [name] has had some absences d/t side effects from a new medication.  She was able to resume exercise today with decreasing the dose.     Expected Outcomes  Pt will continue to participate in CR exercise, nutrition, and lifestyle modification opportunities.        ITP Comments: ITP Comments    Row Name 04/21/18 0835 04/27/18 0849 05/14/18 0810 06/04/18 1640 06/29/18 1529   ITP Comments  Medical Director - Dr. Fransico Him, MD  Pt started exercise today and tolerated it well.   30 Day ITP Review.  Janeli is tolerating exercise well.  She has had some absences d/t sickness and appointments.   30 Day ITP Review.  Crissy is tolerating exercise well.  She continues to have intermittent absences d/t sickness and appointments.  She does feel that she  is increasing his balance.   30 Day ITP Review.  Mikaili continues to tolerate exercise.  She feels that she is increasing her strength and stamina.    [place] Name 07/22/18 1533           ITP Comments  30 Day ITP Review.  Amanda [name] has had some absences during this period d/t a new medication.  She has decreased the dose and was able to participate in exercise.  She graduates 07/29/18.          Comments: See ITP Comments.

## 2018-07-24 ENCOUNTER — Encounter (HOSPITAL_COMMUNITY): Payer: PRIVATE HEALTH INSURANCE

## 2018-07-24 ENCOUNTER — Encounter (HOSPITAL_COMMUNITY)
Admission: RE | Admit: 2018-07-24 | Discharge: 2018-07-24 | Disposition: A | Discharge status: Home / Self Care | Payer: PRIVATE HEALTH INSURANCE | Source: Ambulatory Visit | Attending: Cardiology | Admitting: Cardiology

## 2018-07-24 DIAGNOSIS — I5022 Chronic systolic (congestive) heart failure: Secondary | ICD-10-CM

## 2018-07-27 ENCOUNTER — Encounter (HOSPITAL_COMMUNITY): Payer: PRIVATE HEALTH INSURANCE

## 2018-07-27 ENCOUNTER — Encounter (HOSPITAL_COMMUNITY)
Admission: RE | Admit: 2018-07-27 | Discharge: 2018-07-27 | Disposition: A | Discharge status: Home / Self Care | Payer: PRIVATE HEALTH INSURANCE | Source: Ambulatory Visit | Attending: Cardiology | Admitting: Cardiology

## 2018-07-27 VITALS — Ht 67.0 in | Wt 304.0 lb

## 2018-07-27 DIAGNOSIS — I5022 Chronic systolic (congestive) heart failure: Secondary | ICD-10-CM

## 2018-07-29 ENCOUNTER — Encounter (HOSPITAL_COMMUNITY): Payer: PRIVATE HEALTH INSURANCE

## 2018-07-29 ENCOUNTER — Encounter (HOSPITAL_COMMUNITY)
Admission: RE | Admit: 2018-07-29 | Discharge: 2018-07-29 | Disposition: A | Discharge status: Home / Self Care | Payer: PRIVATE HEALTH INSURANCE | Source: Ambulatory Visit | Attending: Cardiology | Admitting: Cardiology

## 2018-07-29 DIAGNOSIS — I5022 Chronic systolic (congestive) heart failure: Secondary | ICD-10-CM

## 2018-08-10 ENCOUNTER — Other Ambulatory Visit (HOSPITAL_COMMUNITY): Payer: Self-pay

## 2018-08-10 MED ORDER — SPIRONOLACTONE 25 MG PO TABS: 25.0000 mg | ORAL_TABLET | Freq: Every day | ORAL | 10 refills | Status: DC

## 2018-08-10 NOTE — Progress Notes (Signed)
Discharge Progress Report  Patient Details  Name: Amanda [name] MRN: [id number] Date of Birth: 07-12-1981 Referring Provider:     CARDIAC REHAB PHASE II ORIENTATION from 04/21/2018 in [place]  Referring Provider  Loralie Champagne, MD       Number of Visits: 27  Reason for Discharge:  Patient reached a stable level of exercise. Patient independent in their exercise. Patient has met program and personal goals.  Smoking History:  Social History   Tobacco Use  Smoking Status Never Smoker  Smokeless Tobacco Never Used    Diagnosis:  Heart failure, chronic systolic (HCC)  ADL UCSD:   Initial Exercise Prescription: Initial Exercise Prescription - 04/21/18 1000      Date of Initial Exercise RX and Referring Provider   Date  04/21/18    Referring Provider  Loralie Champagne, MD      Treadmill   MPH  1.7    Grade  0    Minutes  10    METs  2.3      Recumbant Bike   Level  2    Watts  25    Minutes  10    METs  2.58      NuStep   Level  2    SPM  85    Minutes  10    METs  2.5      Track   Laps  10    Minutes  10    METs  2.74      Prescription Details   Frequency (times per week)  3    Duration  Progress to 30 minutes of continuous aerobic without signs/symptoms of physical distress      Intensity   THRR 40-80% of Max Heartrate  74-148    Ratings of Perceived Exertion  11-13    Perceived Dyspnea  0-4      Progression   Progression  Continue to progress workloads to maintain intensity without signs/symptoms of physical distress.      Resistance Training   Training Prescription  Yes    Weight  3lbs       Discharge Exercise Prescription (Final Exercise Prescription Changes): Exercise Prescription Changes - 07/29/18 1425      Response to Exercise   Blood Pressure (Admit)  108/60    Blood Pressure (Exercise)  118/60    Blood Pressure (Exit)  108/64    Heart Rate (Admit)  93 bpm    Heart Rate (Exercise)  151 bpm    Heart Rate (Exit)  103 bpm    Rating of Perceived Exertion (Exercise)  12    Perceived Dyspnea (Exercise)  0    Symptoms  Non    Comments  Pt graduated from Cardiac Rehab     Duration  Progress to 45 minutes of aerobic exercise without signs/symptoms of physical distress    Intensity  THRR unchanged      Progression   Progression  Continue to progress workloads to maintain intensity without signs/symptoms of physical distress.    Average METs  3.2      Resistance Training   Training Prescription  Yes    Weight  5lbs    Reps  10-15    Time  10 Minutes      Interval Training   Interval Training  No      Treadmill   MPH  2.4    Grade  2    Minutes  10  METs  3.2      Recumbant Bike   Level  3    Minutes  10    METs  3.2      NuStep   Level  5    SPM  90    Minutes  10    METs  2.8      Home Exercise Plan   Plans to continue exercise at  Home (comment)   Walking   Frequency  Add 3 additional days to program exercise sessions.    Initial Home Exercises Provided  05/18/18       Functional Capacity: 6 Minute Walk    Row Name 04/21/18 0822 07/27/18 1049       6 Minute Walk   Phase  Initial  Discharge    Distance  1340 feet  1616 feet    Distance % Change  -  20.6 %    Distance Feet Change  -  276 ft    Walk Time  6 minutes  6 minutes    # of Rest Breaks  0  0    MPH  2.54  3.1    METS  3.94  4.58    RPE  11  11    Perceived Dyspnea   3  0    VO2 Peak  13.78  16.02    Symptoms  Yes (comment)  Yes (comment)    Comments  Dyspnea  -    Resting HR  95 bpm  104 bpm    Resting BP  98/60  104/68    Resting Oxygen Saturation   98 %  -    Exercise Oxygen Saturation  during 6 min walk  98 %  -    Max Ex. HR  130 bpm  160 bpm    Max Ex. BP  120/70  114/66    2 Minute Post BP  108/60  132/82       Psychological, QOL, Others - Outcomes: PHQ 2/9: Depression screen [facility] 2/9 07/29/2018 04/27/2018  Decreased Interest 0 0  Down, Depressed, Hopeless 0 0  PHQ - 2  Score 0 0    Quality of Life: Quality of Life - 07/24/18 1030      Quality of Life Scores   Health/Function Pre  18.07 %    Health/Function Post  23.83 %    Health/Function % Change  31.88 %    Socioeconomic Pre  16.56 %    Socioeconomic Post  20.14 %    Socioeconomic % Change   21.62 %    Psych/Spiritual Pre  23.1 %    Psych/Spiritual Post  26.57 %    Psych/Spiritual % Change  15.02 %    Family Pre  21.6 %    Family Post  25.5 %    Family % Change  18.06 %    GLOBAL Pre  19.24 %    GLOBAL Post  23.83 %    GLOBAL % Change  23.86 %       Personal Goals: Goals established at orientation with interventions provided to work toward goal. Personal Goals and Risk Factors at Admission - 04/21/18 1040      Core Components/Risk Factors/Patient Goals on Admission    Weight Management  Obesity;Yes    Intervention  Weight Management/Obesity: Establish reasonable short term and long term weight goals.;Obesity: Provide education and appropriate resources to help participant work on and attain dietary goals.    Admit Weight  300 lb 0.7  oz (136.1 kg)    Goal Weight: Short Term  294 lb (133.4 kg)    Goal Weight: Long Term  200 lb (90.7 kg)    Expected Outcomes  Short Term: Continue to assess and modify interventions until short term weight is achieved;Long Term: Adherence to nutrition and physical activity/exercise program aimed toward attainment of established weight goal;Weight Loss: Understanding of general recommendations for a balanced deficit meal plan, which promotes 1-2 lb weight loss per week and includes a negative energy balance of [phone] kcal/d;Understanding recommendations for meals to include 15-35% energy as protein, 25-35% energy from fat, 35-60% energy from carbohydrates, less than 221m of dietary cholesterol, 20-35 gm of total fiber daily;Understanding of distribution of calorie intake throughout the day with the consumption of 4-5 meals/snacks    Heart Failure  Yes     Intervention  Provide a combined exercise and nutrition program that is supplemented with education, support and counseling about heart failure. Directed toward relieving symptoms such as shortness of breath, decreased exercise tolerance, and extremity edema.    Expected Outcomes  Improve functional capacity of life;Short term: Attendance in program 2-3 days a week with increased exercise capacity. Reported lower sodium intake. Reported increased fruit and vegetable intake. Reports medication compliance.;Short term: Daily weights obtained and reported for increase. Utilizing diuretic protocols set by physician.;Long term: Adoption of self-care skills and reduction of barriers for early signs and symptoms recognition and intervention leading to self-care maintenance.    Hypertension  Yes    Intervention  Provide education on lifestyle modifcations including regular physical activity/exercise, weight management, moderate sodium restriction and increased consumption of fresh fruit, vegetables, and low fat dairy, alcohol moderation, and smoking cessation.;Monitor prescription use compliance.    Expected Outcomes  Short Term: Continued assessment and intervention until BP is < 140/930mHG in hypertensive participants. < 130/8036mG in hypertensive participants with diabetes, heart failure or chronic kidney disease.;Long Term: Maintenance of blood pressure at goal levels.        Personal Goals Discharge: Goals and Risk Factor Review    Row Name 04/27/18 0900 05/14/18 0818003/24/19 [id number] 06/29/18 1530 07/22/18 1534     Core Components/Risk Factors/Patient Goals Review   Personal Goals Review  Weight Management/Obesity;Hypertension;Heart Failure  Weight Management/Obesity;Hypertension;Heart Failure  Weight Management/Obesity;Hypertension;Heart Failure  Weight Management/Obesity;Hypertension;Heart Failure  Weight Management/Obesity;Hypertension;Heart Failure   Review  Pt with multiple CAD RFs willing to  participate in CR exercise.  Jullisa would like to increase her heart strength.  Pt with multiple CAD RFs willing to participate in CR exercise.  Jannae is tolerating exercise well.  VSS.  Pt with multiple CAD RFs willing to participate in CR exercise.  Azlynn is tolerating exercise well.  She continues to have intermittent absences but reports increased motivation and balance.   Pt with multiple CAD RFs willing to participate in CR exercise.  Makell continues to tolerate exercise well. She feels that she is increasing her strength and stamina.   Pt with multiple CAD RFs willing to participate in CR exercise.  Baylei has had some absences d/t side effects from a new medication.  She was able to resume exercise today with decreasing the dose.    Expected Outcomes  Pt will continue to participate in CR exercise, nutrition, and lifestyle modification opportunities.   Pt will continue to participate in CR exercise, nutrition, and lifestyle modification opportunities.   Pt will continue to participate in CR exercise, nutrition, and lifestyle modification opportunities.   Pt will continue to  participate in CR exercise, nutrition, and lifestyle modification opportunities.   Pt will continue to participate in CR exercise, nutrition, and lifestyle modification opportunities.    [place] Name 07/29/18 1031             Core Components/Risk Factors/Patient Goals Review   Personal Goals Review  Weight Management/Obesity;Hypertension;Heart Failure       Review  Pt has graduated CR with 27 complete sessions. She feels that she has increased her strength and motivation       Expected Outcomes  Pt will continue to participate in exercise, nutrition, and lifestyle modification opportunities.  Yamina plans to join a gym and walk.           Exercise Goals and Review: Exercise Goals    Row Name 04/21/18 0902             Exercise Goals   Increase Physical Activity  Yes       Intervention  Provide advice, education, support  and counseling about physical activity/exercise needs.;Develop an individualized exercise prescription for aerobic and resistive training based on initial evaluation findings, risk stratification, comorbidities and participant's personal goals.       Expected Outcomes  Short Term: Attend rehab on a regular basis to increase amount of physical activity.;Long Term: Exercising regularly at least 3-5 days a week.;Long Term: Add in home exercise to make exercise part of routine and to increase amount of physical activity.       Increase Strength and Stamina  Yes       Intervention  Provide advice, education, support and counseling about physical activity/exercise needs.;Develop an individualized exercise prescription for aerobic and resistive training based on initial evaluation findings, risk stratification, comorbidities and participant's personal goals.       Expected Outcomes  Short Term: Increase workloads from initial exercise prescription for resistance, speed, and METs.;Short Term: Perform resistance training exercises routinely during rehab and add in resistance training at home;Long Term: Improve cardiorespiratory fitness, muscular endurance and strength as measured by increased METs and functional capacity (6MWT)       Able to understand and use rate of perceived exertion (RPE) scale  Yes       Intervention  Provide education and explanation on how to use RPE scale       Expected Outcomes  Short Term: Able to use RPE daily in rehab to express subjective intensity level;Long Term:  Able to use RPE to guide intensity level when exercising independently       Knowledge and understanding of Target Heart Rate Range (THRR)  Yes       Intervention  Provide education and explanation of THRR including how the numbers were predicted and where they are located for reference       Expected Outcomes  Short Term: Able to state/look up THRR;Long Term: Able to use THRR to govern intensity when exercising  independently;Short Term: Able to use daily as guideline for intensity in rehab       Able to check pulse independently  Yes       Intervention  Provide education and demonstration on how to check pulse in carotid and radial arteries.;Review the importance of being able to check your own pulse for safety during independent exercise       Expected Outcomes  Short Term: Able to explain why pulse checking is important during independent exercise;Long Term: Able to check pulse independently and accurately       Understanding of Exercise Prescription  Yes  Intervention  Provide education, explanation, and written materials on patient's individual exercise prescription       Expected Outcomes  Short Term: Able to explain program exercise prescription;Long Term: Able to explain home exercise prescription to exercise independently          Exercise Goals Re-Evaluation: Exercise Goals Re-Evaluation    Row Name 05/22/18 1629 06/29/18 1024 07/22/18 0918 07/29/18 1504       Exercise Goal Re-Evaluation   Exercise Goals Review  Increase Physical Activity;Able to understand and use rate of perceived exertion (RPE) scale;Knowledge and understanding of Target Heart Rate Range (THRR);Understanding of Exercise Prescription;Increase Strength and Stamina;Able to check pulse independently  Increase Physical Activity;Understanding of Exercise Prescription  Increase Physical Activity;Understanding of Exercise Prescription  Increase Physical Activity;Understanding of Exercise Prescription    Comments  Reviewed HEP with pt. Also reviewed THRR, RPE Scale, weather conditions, NTG use, endpoints of exercise, warmup and cool down.   Reviewed METs and Goals with pt. Added Upright Recumbent bike to pt's exercise prescription. Pt states this bike feels better than the regular recumbent bike. Pt puts forth great effort and is able to exercise 30 minutes with no issues.   Pt is continuing to respond well to exericse  prescription. Pt put forth great effort today despite not feeling well. Will continue to work with pt to increase strength and stamina.   Pt completed 27 sessions of rehab. Pt increased functional capacity by 20.60%. Pt increased post 6MWT distance by 260f. Pt's goals was to increase cardiovascular strength. Pt reached that goal and states she feels better after exercising. Encouraged pt to continue to exercise to help continue to build her cardiovascular strength and make her heart stronger.     Expected Outcomes  Pt will plan to continue to walk 2-3 days a week for 5 minutes. Pt will gradually increase walking time till she gets 30 minutes. Pt will continue to increase cardiovascular fitness. Will continue to monitor pt.   Pt will continue to walk 2-3 days a week for 10-15 minutes. Pt will continue to increase cardiovascular fitness. Will continue to monitor.   Pt will continue to walk at home 2 days a week for 30 minutes. Pt will continue to increase cardiovascular capacity. Will continue to monitor and progress pt.    Pt will cotinue to exercise 3-4 days a week 30-45 minutes. Pt [place] walk for exercise [place]        Nutrition & Weight - Outcomes: Pre Biometrics - 04/21/18 1029      Pre Biometrics   Height  _0  (1.702 m)    Weight  (!) 136.1 kg    Waist Circumference  42 inches    Hip Circumference  56.25 inches    Waist to Hip Ratio  0.75 %    BMI (Calculated)  46.98    Triceps Skinfold  49 mm    % Body Fat  51.6 %    Grip Strength  31.5 kg    Flexibility  0 in    Single Leg Stand  3.41 seconds      Post Biometrics - 07/27/18 1050       Post  Biometrics   Height  _1  (1.702 m)    Weight  (!) 137.9 kg    Waist Circumference  44 inches    Hip Circumference  55 inches    Waist to Hip Ratio  0.8 %    BMI (Calculated)  47.6  Triceps Skinfold  45 mm    % Body Fat  52.3 %    Grip Strength  42 kg    Flexibility  15 in    Single Leg Stand  4.25 seconds        Nutrition: Nutrition Therapy & Goals - 08/07/18 1121      Nutrition Therapy   Diet  heart healthy      Personal Nutrition Goals   Nutrition Goal  Pt to identify and limit food sources of saturated fat, trans fat, refined carbohydrates and sodium    Personal Goal #2  Pt to identify food quantities necessary to achieve weight loss of 6-24 lbs. at graduation from cardiac rehab      [place], educate and counsel regarding individualized specific dietary modifications aiming towards targeted core components such as weight, hypertension, lipid management, diabetes, heart failure and other comorbidities.    Expected Outcomes  Short Term Goal: Understand basic principles of dietary content, such as calories, fat, sodium, cholesterol and nutrients.;Long Term Goal: Adherence to prescribed nutrition plan.       Nutrition Discharge: Nutrition Assessments - 08/07/18 1120      MEDFICTS Scores   Pre Score  62    Post Score  12    Score Difference  -50       Education Questionnaire Score: Knowledge Questionnaire Score - 07/27/18 1048      Knowledge Questionnaire Score   Pre Score  20/24    Post Score  20/24       Goals reviewed with patient; copy given to patient.

## 2018-08-26 ENCOUNTER — Telehealth (HOSPITAL_COMMUNITY): Payer: Self-pay | Admitting: Licensed Clinical Social Worker

## 2018-08-26 NOTE — Telephone Encounter (Signed)
CSW received consult that pt is struggling with copay for Entresto ($100 for 30 day supply).  Pt states she had copay card last year that made the cost affordable- CSW put [place] copay card at front desk- pt to pick up by tomorrow morning  CSW will continue to follow through clinic and assist as needed  Jorge Ny, [place] Worker [facility] [phone]

## 2018-09-15 ENCOUNTER — Encounter (HOSPITAL_COMMUNITY): Payer: PRIVATE HEALTH INSURANCE | Admitting: Cardiology

## 2018-11-17 ENCOUNTER — Encounter (HOSPITAL_COMMUNITY): Payer: PRIVATE HEALTH INSURANCE | Admitting: Cardiology

## 2018-11-23 ENCOUNTER — Telehealth (HOSPITAL_COMMUNITY): Payer: Self-pay

## 2018-11-23 ENCOUNTER — Other Ambulatory Visit: Payer: Self-pay

## 2018-11-23 ENCOUNTER — Ambulatory Visit (HOSPITAL_COMMUNITY)
Admission: RE | Admit: 2018-11-23 | Discharge: 2018-11-23 | Disposition: A | Discharge status: Home / Self Care | Payer: PRIVATE HEALTH INSURANCE | Source: Ambulatory Visit | Attending: Cardiology | Admitting: Cardiology

## 2018-11-23 DIAGNOSIS — R0683 Snoring: Secondary | ICD-10-CM | POA: Diagnosis not present

## 2018-11-23 DIAGNOSIS — Z8742 Personal history of other diseases of the female genital tract: Secondary | ICD-10-CM

## 2018-11-23 DIAGNOSIS — I429 Cardiomyopathy, unspecified: Secondary | ICD-10-CM

## 2018-11-23 DIAGNOSIS — E669 Obesity, unspecified: Secondary | ICD-10-CM

## 2018-11-23 DIAGNOSIS — I428 Other cardiomyopathies: Secondary | ICD-10-CM | POA: Diagnosis not present

## 2018-11-23 DIAGNOSIS — G35 Multiple sclerosis: Secondary | ICD-10-CM

## 2018-11-23 DIAGNOSIS — Z862 Personal history of diseases of the blood and blood-forming organs and certain disorders involving the immune mechanism: Secondary | ICD-10-CM

## 2018-11-23 DIAGNOSIS — I5022 Chronic systolic (congestive) heart failure: Secondary | ICD-10-CM

## 2018-11-23 NOTE — Progress Notes (Signed)
Heart Failure TeleHealth Note  Due to national recommendations of social distancing due to [place] 19, Audio/video telehealth visit is felt to be most appropriate for this patient at this time.  See MyChart message from today for patient consent regarding telehealth for [facility].  Date:  11/23/2018   ID:  Amanda [name], DOB Aug 09, 1981, MRN [id number]  Location: Home  Provider location: [facility] Type of Visit: Established patient   PCP:  [name], PA  Cardiologist:  Dr. Aundra Dubin  Chief Complaint: Exertional shortness of breath.   History of Present Illness: Amanda [name] is a 38 y.o. female who presents via audio/video conferencing for a telehealth visit today.     she denies symptoms worrisome for COVID 19.   Patient has a history of multiple sclerosis, anemia due to uterine fibroids, and systolic HF (dx 09/8784).  Ad[facility] to [facility]/31-01/13/18  with volume overload and found to have acute systolic heart failure. Echo showed EF 10-15%. HF team consulted. R/LHC completed, which showed no CAD and preserved CO. Cardiac MRI showed no LGE, EF 14%. She was diuresed with IV lasix and transitioned to lasix 40 mg daily. HF meds were optimized. DC weight: 299 lbs.  Echo in 11/18 showed EF 35% with diffuse hypokinesis.   At last appointment, I started her Bidil.  She has had severe headaches with Bidil even after cutting dose back to 1/2 tab tid.  She has mild lightheadedness with standing at times but no falls.  Her balance has been off more recently, she attributes this to worsening of her MS. She had an episode of dyspnea while shopping in 2/20, but no significant dyspnea since that time.  No orthopnea/PND.  No chest pain.  Weight has been fairly stable at home.   Labs (6/19): K 4.3, creatinine 0.88 Labs (10/19): K 3.4, creatinine 0.8 Labs (11/19): K 4.1, creatinine 0.74  PMH:  1. Multiple sclerosis 2. Uterine fibroids 3. Anemia: Likely related to  fibroids.  4. Chronic systolic CHF: Diagnosed in 5/19.  Nonischemic cardiomyopathy.  - Echo (6/19) with EF 10-15%.  - RHC/LHC (6/19): mean RA 5, PA 30/13 mean 22, mean PCWP 9, CI 2.63.  No angiographic CAD.  - Cardiac MRI (6/19): EF 14% with severely dilated LV, mildly dilated RV with mildly decreased systolic function, no myocardial LGE.  - Echo (8/19): EF 30%, diffuse hypokinesis, mildly dilated RV with normal systolic function, PASP 40 mmHg.  - Echo (11/19): EF 35%, diffuse hypokinesis, mildly dilated RV with normal systolic function.  5. Sleep study (9/19): No OSA.   Past Surgical History:  Procedure Laterality Date  . CARDIAC CATHETERIZATION  01/12/2018     Current Outpatient Medications  Medication Sig Dispense Refill  . carvedilol (COREG) 25 MG tablet Take 1 tablet (25 mg total) by mouth 2 (two) times daily with a meal. 60 tablet 5  . ferrous sulfate 325 (65 FE) MG tablet Take 1 tablet (325 mg total) by mouth daily with breakfast. 30 tablet 3  . furosemide (LASIX) 40 MG tablet Take 0.5 tablets (20 mg total) by mouth daily. 15 tablet 5  . ibuprofen (ADVIL,MOTRIN) 200 MG tablet Take 200 mg by mouth every 6 (six) hours as needed for headache.    . potassium chloride SA (K-DUR,KLOR-CON) 20 MEQ tablet Take 2 tablets (40 mEq total) by mouth daily. 60 tablet 5  . sacubitril-valsartan (ENTRESTO) 97-103 MG Take 1 tablet by mouth 2 (two) times daily. 60 tablet  5  . spironolactone (ALDACTONE) 25 MG tablet Take 1 tablet (25 mg total) by mouth daily. 30 tablet 10  . Teriflunomide (AUBAGIO) 14 MG TABS Take 14 mg by mouth daily.      No current facility-administered medications for this encounter.     Allergies:   Patient has no known allergies.   Social History:  The patient  reports that she has never smoked. She has never used smokeless tobacco. She reports that she does not drink alcohol or use drugs.   Family History:  The patient's family history includes Diabetes in her mother;  Fibroids in her sister; Hypertension in her mother.   ROS:  Please see the history of present illness.   All other systems are personally reviewed and negative.   Exam:  (Video/Tele Health Call; Exam is subjective and or/visual.) General:  Speaks in full sentences. No resp difficulty. Lungs: Normal respiratory effort with conversation.  Abdomen: Non-distended per patient report Extremities: Pt denies edema. Neuro: Alert & oriented x 3.   Recent Labs: 01/09/2018: B Natriuretic Peptide 1,069.8 01/13/2018: Hemoglobin 12.3; Platelets 265 04/14/2018: TSH 1.531 06/16/2018: BUN 11; Creatinine, Ser 0.74; Potassium 4.1; Sodium 140  Personally reviewed   Wt Readings from Last 3 Encounters:  07/27/18 (!) 137.9 kg (304 lb 0.2 oz)  07/16/18 (!) 138.3 kg (305 lb)  06/16/18 (!) 137.4 kg (303 lb)      ASSESSMENT AND PLAN:  1. Chronic systolic CHF: Echo 9/37 with EF 10-15%, moderate MR. Nonischemic cardiomyopathy with no coronary disease on 6/19 cath. 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 Mother developed CHF in her 71s but no other family members have CHF that she knows of (has several healthy siblings). She does not drink ETOH or use drugs. No children, has not been pregnant so not peri-partum CMP. TSH normal.  RHC 01/12/18 with normal filling pressures and preserved CO.  Cardiac MRI 01/12/18 did not show LGE, EF 14%.Repeat echo 11/19 showed EF up to 35%.  NYHA class II symptoms with stable weight.   - Continue Lasix 20 mg daily.  I will arrange for BMET.  - Continue Entresto 97/103 bid. - Continue spironolactone 25 daily.  - Continue Coreg 25 mg bid.  - I do not think that she will be able to tolerate even a low dose of Bidil.  I will stop it.   - EF was better on last echo at 35%.  I will repeat an echo at followup in 2 months.  If EF > 35%, no ICD.  However, if < 35%, would go forward with ICD (though nonischemic, she is of young age).  We discussed this today.  2. History of uterine fibroids  with bleeding/anemia: Stable currently.  3. Multiple sclerosis: Aubagio, her MS medication, does not appear to have CHF as a side effect. Only cardiac side effect appears to be HTN.  She thinks that her MS is "acting up" and affected her balance.  She is going to call her neurologist.  4. Snores: Sleep study was negative.   5. Obesity: We discussed weight loss with diet and exercise.   COVID screen The patient does not have any symptoms that suggest any further testing/ screening at this time.  Social distancing reinforced today.  Relevant cardiac medications were reviewed at length with the patient today.   The patient does not have concerns regarding their medications at this time.   Recommended follow-up:  2 months with echo.   Today, I have spent 22 minutes with  the patient with telehealth technology discussing the above issues .    Signed, Loralie Champagne, MD  11/23/2018 10:22 PM  [place] [address] [facility] [place] [address] [phone] (office) [phone] (fax)

## 2018-11-23 NOTE — Telephone Encounter (Signed)
Went over AVS with pt, scheudled f/u BMET, placed order for ECHO. Needs f/u 2 month with DM/ECHO

## 2018-11-23 NOTE — Patient Instructions (Addendum)
Office visit + echo in 2 months, scheduler will call  Needs BMET scheduled for 23 April @10 :53 .

## 2018-12-03 ENCOUNTER — Ambulatory Visit (HOSPITAL_COMMUNITY)
Admission: RE | Admit: 2018-12-03 | Discharge: 2018-12-03 | Disposition: A | Discharge status: Home / Self Care | Payer: BLUE CROSS/BLUE SHIELD | Source: Ambulatory Visit | Attending: Cardiology | Admitting: Cardiology

## 2018-12-03 ENCOUNTER — Other Ambulatory Visit: Payer: Self-pay

## 2018-12-03 DIAGNOSIS — I429 Cardiomyopathy, unspecified: Secondary | ICD-10-CM

## 2018-12-03 DIAGNOSIS — I428 Other cardiomyopathies: Secondary | ICD-10-CM | POA: Diagnosis not present

## 2018-12-03 LAB — BASIC METABOLIC PANEL
Anion gap: 6 (ref 5–15)
BUN: 8 mg/dL (ref 6–20)
CO2: 22 mmol/L (ref 22–32)
Calcium: 9 mg/dL (ref 8.9–10.3)
Chloride: 113 mmol/L — ABNORMAL HIGH (ref 98–111)
Creatinine, Ser: 0.78 mg/dL (ref 0.44–1.00)
GFR calc Af Amer: >60 mL/min (ref >60)
GFR calc non Af Amer: >60 mL/min (ref >60)
Glucose, Bld: 107 mg/dL — ABNORMAL HIGH (ref 70–99)
Potassium: 4.1 mmol/L (ref 3.5–5.1)
Sodium: 141 mmol/L (ref 135–145)

## 2018-12-25 ENCOUNTER — Other Ambulatory Visit (HOSPITAL_COMMUNITY): Payer: Self-pay | Admitting: *Deleted

## 2018-12-25 MED ORDER — SACUBITRIL-VALSARTAN 97-103 MG PO TABS: 1.0000 | ORAL_TABLET | Freq: Two times a day (BID) | ORAL | 5 refills | Status: DC

## 2018-12-25 MED ORDER — CARVEDILOL 25 MG PO TABS: 25.0000 mg | ORAL_TABLET | Freq: Two times a day (BID) | ORAL | 5 refills | Status: DC

## 2018-12-25 MED ORDER — FUROSEMIDE 40 MG PO TABS: 20.0000 mg | ORAL_TABLET | Freq: Every day | ORAL | 5 refills | Status: DC

## 2019-01-06 DIAGNOSIS — Z0000 Encounter for general adult medical examination without abnormal findings: Secondary | ICD-10-CM | POA: Diagnosis not present

## 2019-01-11 DIAGNOSIS — D508 Other iron deficiency anemias: Secondary | ICD-10-CM | POA: Diagnosis not present

## 2019-01-11 DIAGNOSIS — E559 Vitamin D deficiency, unspecified: Secondary | ICD-10-CM | POA: Diagnosis not present

## 2019-01-11 DIAGNOSIS — Z136 Encounter for screening for cardiovascular disorders: Secondary | ICD-10-CM | POA: Diagnosis not present

## 2019-01-26 DIAGNOSIS — G35 Multiple sclerosis: Secondary | ICD-10-CM | POA: Diagnosis not present

## 2019-01-26 DIAGNOSIS — Z719 Counseling, unspecified: Secondary | ICD-10-CM | POA: Diagnosis not present

## 2019-02-04 ENCOUNTER — Telehealth (HOSPITAL_COMMUNITY): Payer: Self-pay | Admitting: Cardiology

## 2019-02-04 NOTE — Telephone Encounter (Signed)
Patient returned my call to confirm appt.

## 2019-02-04 NOTE — Telephone Encounter (Signed)
Called patient and left VM with new appt info for 03/11/2019 for Echo and f/u with Dr. Aundra Dubin.  Asked pt to call back to confirm.

## 2019-02-17 ENCOUNTER — Encounter (HOSPITAL_COMMUNITY): Payer: BLUE CROSS/BLUE SHIELD | Admitting: Cardiology

## 2019-02-17 ENCOUNTER — Other Ambulatory Visit (HOSPITAL_COMMUNITY): Payer: BLUE CROSS/BLUE SHIELD

## 2019-02-26 DIAGNOSIS — R252 Cramp and spasm: Secondary | ICD-10-CM | POA: Diagnosis not present

## 2019-02-26 DIAGNOSIS — G35 Multiple sclerosis: Secondary | ICD-10-CM | POA: Diagnosis not present

## 2019-03-11 ENCOUNTER — Encounter (HOSPITAL_COMMUNITY): Payer: Self-pay | Admitting: Cardiology

## 2019-03-11 ENCOUNTER — Ambulatory Visit (HOSPITAL_COMMUNITY)
Admission: RE | Admit: 2019-03-11 | Discharge: 2019-03-11 | Disposition: A | Discharge status: Home / Self Care | Payer: BC Managed Care – PPO | Source: Ambulatory Visit | Attending: Cardiology | Admitting: Cardiology

## 2019-03-11 ENCOUNTER — Ambulatory Visit (HOSPITAL_BASED_OUTPATIENT_CLINIC_OR_DEPARTMENT_OTHER)
Admission: RE | Admit: 2019-03-11 | Discharge: 2019-03-11 | Disposition: A | Discharge status: Home / Self Care | Payer: BC Managed Care – PPO | Source: Ambulatory Visit | Attending: Cardiology | Admitting: Cardiology

## 2019-03-11 ENCOUNTER — Other Ambulatory Visit: Payer: Self-pay

## 2019-03-11 DIAGNOSIS — Z6841 Body Mass Index (BMI) 40.0 and over, adult: Secondary | ICD-10-CM | POA: Insufficient documentation

## 2019-03-11 DIAGNOSIS — D649 Anemia, unspecified: Secondary | ICD-10-CM | POA: Diagnosis not present

## 2019-03-11 DIAGNOSIS — Z7984 Long term (current) use of oral hypoglycemic drugs: Secondary | ICD-10-CM | POA: Diagnosis not present

## 2019-03-11 DIAGNOSIS — I429 Cardiomyopathy, unspecified: Secondary | ICD-10-CM

## 2019-03-11 DIAGNOSIS — I428 Other cardiomyopathies: Secondary | ICD-10-CM | POA: Diagnosis not present

## 2019-03-11 DIAGNOSIS — I110 Hypertensive heart disease with heart failure: Secondary | ICD-10-CM | POA: Diagnosis not present

## 2019-03-11 DIAGNOSIS — Z79899 Other long term (current) drug therapy: Secondary | ICD-10-CM | POA: Insufficient documentation

## 2019-03-11 DIAGNOSIS — Z8249 Family history of ischemic heart disease and other diseases of the circulatory system: Secondary | ICD-10-CM | POA: Insufficient documentation

## 2019-03-11 DIAGNOSIS — I5022 Chronic systolic (congestive) heart failure: Secondary | ICD-10-CM | POA: Insufficient documentation

## 2019-03-11 DIAGNOSIS — D259 Leiomyoma of uterus, unspecified: Secondary | ICD-10-CM | POA: Diagnosis not present

## 2019-03-11 DIAGNOSIS — G35 Multiple sclerosis: Secondary | ICD-10-CM | POA: Diagnosis not present

## 2019-03-11 DIAGNOSIS — E669 Obesity, unspecified: Secondary | ICD-10-CM | POA: Insufficient documentation

## 2019-03-11 LAB — BASIC METABOLIC PANEL
Anion gap: 4 — ABNORMAL LOW (ref 5–15)
BUN: 9 mg/dL (ref 6–20)
CO2: 26 mmol/L (ref 22–32)
Calcium: 9 mg/dL (ref 8.9–10.3)
Chloride: 107 mmol/L (ref 98–111)
Creatinine, Ser: 0.68 mg/dL (ref 0.44–1.00)
GFR calc Af Amer: >60 mL/min (ref >60)
GFR calc non Af Amer: >60 mL/min (ref >60)
Glucose, Bld: 88 mg/dL (ref 70–99)
Potassium: 4.3 mmol/L (ref 3.5–5.1)
Sodium: 137 mmol/L (ref 135–145)

## 2019-03-11 MED ORDER — DAPAGLIFLOZIN PROPANEDIOL 10 MG PO TABS: 10.0000 mg | ORAL_TABLET | Freq: Every day | ORAL | 3 refills | Status: DC

## 2019-03-11 NOTE — Progress Notes (Signed)
  Echocardiogram 2D Echocardiogram has been performed.  Roseanna Rainbow R 03/11/2019, 11:08 AM

## 2019-03-11 NOTE — Progress Notes (Signed)
Date:  03/11/2019   ID:  Nira Retort, DOB 23-Mar-1981, MRN [id number]   Provider location: [place] [facility] Type of Visit: Established patient   PCP:  [name], PA  Cardiologist:  Dr. Aundra Dubin   History of Present Illness: Amanda [name] is a 38 y.o. female who has a history of multiple sclerosis, anemia due to uterine fibroids, and systolic HF (dx 11/979).  Admitted to [facility] 5/31-01/13/18  with volume overload and found to have acute systolic heart failure. Echo showed EF 10-15%. HF team consulted. R/LHC completed, which showed no CAD and preserved CO. Cardiac MRI showed no LGE, EF 14%. She was diuresed with IV lasix and transitioned to lasix 40 mg daily. HF meds were optimized. DC weight: 299 lbs.  Echo in 11/18 showed EF 35% with diffuse hypokinesis.   She has been unable to tolerate even low dose Bidil.   I reviewed her echo done today. This showed EF 40%, mild LVH, normal RV, normal IVC.   She returns today for followup of CHF.  Weight is up 25 lbs.  Since the start of the coronavirus pandemic, she has mostly stopped exercising and has not been watching her diet as well. She has only mild dyspnea with heavy exertion.  No orthopnea/PND.  No chest pain.  No lightheadedness or palpitations.  She thinks that her weight gain is caloric and not due to fluid retention.   Labs (6/19): K 4.3, creatinine 0.88 Labs (10/19): K 3.4, creatinine 0.8 Labs (11/19): K 4.1, creatinine 0.74 Labs (4/20): K 4.1, creatinine 0.78  PMH:  1. Multiple sclerosis 2. Uterine fibroids 3. Anemia: Likely related to fibroids.  4. Chronic systolic CHF: Diagnosed in 5/19.  Nonischemic cardiomyopathy.  - Echo (6/19) with EF 10-15%.  - RHC/LHC (6/19): mean RA 5, PA 30/13 mean 22, mean PCWP 9, CI 2.63.  No angiographic CAD.  - Cardiac MRI (6/19): EF 14% with severely dilated LV, mildly dilated RV with mildly decreased systolic function, no myocardial LGE.  - Echo (8/19): EF 30%, diffuse  hypokinesis, mildly dilated RV with normal systolic function, PASP 40 mmHg.  - Echo (11/19): EF 35%, diffuse hypokinesis, mildly dilated RV with normal systolic function.  - Echo (4/20): EF 40% with mild LV dilation and mild LVH, normal RV, normal IVC.  5. Sleep study (9/19): No OSA.   Past Surgical History:  Procedure Laterality Date  . CARDIAC CATHETERIZATION  01/12/2018     Current Outpatient Medications  Medication Sig Dispense Refill  . carvedilol (COREG) 25 MG tablet Take 1 tablet (25 mg total) by mouth 2 (two) times daily with a meal. 60 tablet 5  . ferrous sulfate 325 (65 FE) MG tablet Take 1 tablet (325 mg total) by mouth daily with breakfast. 30 tablet 3  . furosemide (LASIX) 40 MG tablet Take 0.5 tablets (20 mg total) by mouth daily. 15 tablet 5  . gabapentin (NEURONTIN) 300 MG capsule Take by mouth.    Marland Kitchen ibuprofen (ADVIL,MOTRIN) 200 MG tablet Take 200 mg by mouth every 6 (six) hours as needed for headache.    . potassium chloride SA (K-DUR,KLOR-CON) 20 MEQ tablet Take 2 tablets (40 mEq total) by mouth daily. 60 tablet 5  . sacubitril-valsartan (ENTRESTO) 97-103 MG Take 1 tablet by mouth 2 (two) times daily. 60 tablet 5  . spironolactone (ALDACTONE) 25 MG tablet Take 1 tablet (25 mg total) by mouth daily. 30 tablet 10  . Teriflunomide (AUBAGIO) 14 MG TABS  Take 14 mg by mouth daily.     . dapagliflozin propanediol (FARXIGA) 10 MG TABS tablet Take 10 mg by mouth daily. 90 tablet 3   No current facility-administered medications for this encounter.     Allergies:   Patient has no known allergies.   Social History:  The patient  reports that she has never smoked. She has never used smokeless tobacco. She reports that she does not drink alcohol or use drugs.   Family History:  The patient's family history includes Diabetes in her mother; Fibroids in her sister; Hypertension in her mother.   ROS:  Please see the history of present illness.   All other systems are personally  reviewed and negative.   Exam:   BP (!) 131/51   Pulse 92   Wt (!) 149.1 kg (328 lb 9.6 oz)   SpO2 96%   BMI 51.47 kg/m  General: NAD, obese.  Neck: Thick, no JVD, no thyromegaly or thyroid nodule.  Lungs: Clear to auscultation bilaterally with normal respiratory effort. CV: Nondisplaced PMI.  Heart regular S1/S2, no S3/S4, no murmur.  No peripheral edema.  No carotid bruit.  Normal pedal pulses.  Abdomen: Soft, nontender, no hepatosplenomegaly, no distention.  Skin: Intact without lesions or rashes.  Neurologic: Alert and oriented x 3.  Psych: Normal affect. Extremities: No clubbing or cyanosis.  HEENT: Normal.   Recent Labs: 04/14/2018: TSH 1.531 03/11/2019: BUN 9; Creatinine, Ser 0.68; Potassium 4.3; Sodium 137  Personally reviewed   Wt Readings from Last 3 Encounters:  03/11/19 (!) 149.1 kg (328 lb 9.6 oz)  07/27/18 (!) 137.9 kg (304 lb 0.2 oz)  07/16/18 (!) 138.3 kg (305 lb)      ASSESSMENT AND PLAN:  1. Chronic systolic CHF: Echo 4/65 with EF 10-15%, moderate MR. Nonischemic cardiomyopathy with no coronary disease on 6/19 cath. 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 Mother developed CHF in her 12s but no other family members have CHF that she knows of (has several healthy siblings). She does not drink ETOH or use drugs. No children, has not been pregnant so not peri-partum CMP. TSH normal.  RHC 01/12/18 with normal filling pressures and preserved CO. Cardiac MRI 01/12/18 did not show LGE, EF 14%.Repeat echo 11/19 showed EF up to 35%.  Echo today was reviewed, EF 40%.  NYHA class II symptoms.  Weight is up considerably but she does not appear to be volume overloaded.  She has stopped exercising and following her diet, suspect this is the cause of the weight gain.   - Continue Lasix 20 mg daily.  BMET today.   - Continue Entresto 97/103 bid. - Continue spironolactone 25 daily.  - Continue Coreg 25 mg bid.  - I do not think that she will be able to tolerate even a low dose  of Bidil.   - EF is now out of ICD range.  - I will start her on dapagliflozin 10 mg daily.  2. History of uterine fibroids with bleeding/anemia: Stable currently.  3. Multiple sclerosis: Aubagio, her MS medication, does not appear to have CHF as a side effect. Only cardiac side effect appears to be HTN.   4. Snores: Sleep study was negative.   5. Obesity: We discussed weight loss with diet and exercise.  - I am going to refer her to the [facility] weight loss clinic with Dr. Leafy Ro.   Signed, Loralie Champagne, MD  03/11/2019  [facility]  [place] [address] [phone] (office) [phone] (fax)

## 2019-03-11 NOTE — Patient Instructions (Signed)
Labs were done today. We will call you with any ABNORMAL results. No news is good news!  BEGIN taking Farxiga 10 mg (1 tab) daily.  You have been referred to Medical Weight Management. This office will call to schedule an appointment with you.  Your physician recommends that you schedule a follow-up appointment in: 4 months.  At the [facility], you and your health needs are our priority. As part of our continuing mission to provide you with exceptional heart care, we have created designated Provider Care Teams. These Care Teams include your primary Cardiologist (physician) and Advanced Practice Providers (APPs- Physician Assistants and Nurse Practitioners) who all work together to provide you with the care you need, when you need it.   You may see any of the following providers on your designated Care Team at your next follow up: Marland Kitchen Dr Glori Bickers . Dr Loralie Champagne . Darrick Grinder, NP

## 2019-03-15 ENCOUNTER — Other Ambulatory Visit (HOSPITAL_COMMUNITY): Payer: Self-pay

## 2019-03-15 MED ORDER — DAPAGLIFLOZIN PROPANEDIOL 10 MG PO TABS: 10.0000 mg | ORAL_TABLET | Freq: Every day | ORAL | 3 refills | Status: DC

## 2019-03-23 ENCOUNTER — Telehealth (HOSPITAL_COMMUNITY): Payer: Self-pay | Admitting: Pharmacy Technician

## 2019-03-23 NOTE — Telephone Encounter (Signed)
Advanced Heart Failure Patient Advocate Encounter  Prior Authorization for Wilder Glade 10mg  has been approved.    PA# [id number] Effective dates: 02/21/2019 through 03/22/2020  Patients co-pay is $15.00 for 30 Day supply.  Called patient to make her aware of this approval and had to leave voicemail.  Also called [place] to ensure claim went through successfully and they are getting the medication ready for Mrs. Dills.  Charlann Boxer, CPhT

## 2019-03-24 ENCOUNTER — Other Ambulatory Visit (HOSPITAL_COMMUNITY): Payer: Self-pay

## 2019-03-24 MED ORDER — DAPAGLIFLOZIN PROPANEDIOL 10 MG PO TABS: 10.0000 mg | ORAL_TABLET | Freq: Every day | ORAL | 3 refills | Status: DC

## 2019-04-24 ENCOUNTER — Other Ambulatory Visit (HOSPITAL_COMMUNITY): Payer: Self-pay | Admitting: Cardiology

## 2019-04-27 ENCOUNTER — Other Ambulatory Visit (HOSPITAL_COMMUNITY): Payer: Self-pay | Admitting: Cardiology

## 2019-07-05 ENCOUNTER — Telehealth (HOSPITAL_COMMUNITY): Payer: Self-pay | Admitting: *Deleted

## 2019-07-05 NOTE — Telephone Encounter (Signed)
PT returned call regarding switching appt to telehealth appt.  Pt confirmed it was ok to make the switch as she was feeling well and did not have any concerns that she needed to come for an in person visit for.    Appt type changed.

## 2019-07-05 NOTE — Telephone Encounter (Signed)
Left vm asking patient to return my call about changing apt on 11/30 to a telehealth visit.

## 2019-07-12 ENCOUNTER — Other Ambulatory Visit: Payer: Self-pay

## 2019-07-12 ENCOUNTER — Ambulatory Visit (HOSPITAL_COMMUNITY)
Admission: RE | Admit: 2019-07-12 | Discharge: 2019-07-12 | Disposition: A | Discharge status: Home / Self Care | Payer: BC Managed Care – PPO | Source: Ambulatory Visit | Attending: Cardiology | Admitting: Cardiology

## 2019-07-12 DIAGNOSIS — I5022 Chronic systolic (congestive) heart failure: Secondary | ICD-10-CM | POA: Diagnosis not present

## 2019-07-12 MED ORDER — SPIRONOLACTONE 25 MG PO TABS: 25.0000 mg | ORAL_TABLET | Freq: Every day | ORAL | 10 refills | Status: DC

## 2019-07-12 MED ORDER — FUROSEMIDE 40 MG PO TABS: 20.0000 mg | ORAL_TABLET | Freq: Every day | ORAL | 11 refills | Status: DC

## 2019-07-12 NOTE — Progress Notes (Signed)
Heart Failure TeleHealth Note  Due to national recommendations of social distancing due to [place] 19, Audio/video telehealth visit is felt to be most appropriate for this patient at this time.  See MyChart message from today for patient consent regarding telehealth for [name].  Date:  07/12/2019   ID:  Nira Retort, DOB Mar 05, 1981, MRN [id number]  Location: Home  Provider location: [facility] [facility] Type of Visit: Established patient   PCP:  [name], PA  Cardiologist:  Dr. Aundra Dubin  Chief Complaint: Fatigue.    History of Present Illness: Amanda [name] is a 38 y.o. female who presents via audio/video conferencing for a telehealth visit today.     She denies symptoms worrisome for COVID 19.   Patient has a history of multiple sclerosis, anemia due to uterine fibroids, and systolic HF (dx [id number]).  Admitted to [facility] 5/31-01/13/18  with volume overload and found to have acute systolic heart failure. Echo showed EF 10-15%. HF team consulted. R/LHC completed, which showed no CAD and preserved CO. Cardiac MRI showed no LGE, EF 14%. She was diuresed with IV lasix and transitioned to lasix 40 mg daily. HF meds were optimized. DC weight: 299 lbs.  Echo in 11/18 showed EF 35% with diffuse hypokinesis.   She has been unable to tolerate even low dose Bidil.   Echo in 4/20 showed EF 40%, mild LVH, normal RV, normal IVC.   She has been symptomatically stable.  No dyspnea walking up stairs.  No problems with ADLs.  No orthopnea/PND.  Plans to start in the [facility] after the new year.  Weight has been stable.   Labs (6/19): K 4.3, creatinine 0.88 Labs (10/19): K 3.4, creatinine 0.8 Labs (11/19): K 4.1, creatinine 0.74 Labs (4/20): K 4.1, creatinine 0.78 Labs (7/20): K 4.3, creatinine 0.68  PMH:  1. Multiple sclerosis 2. Uterine fibroids 3. Anemia: Likely related to fibroids.  4. Chronic systolic CHF: Diagnosed in 5/19.   Nonischemic cardiomyopathy.  - Echo (6/19) with EF 10-15%.  - RHC/LHC (6/19): mean RA 5, PA 30/13 mean 22, mean PCWP 9, CI 2.63.  No angiographic CAD.  - Cardiac MRI (6/19): EF 14% with severely dilated LV, mildly dilated RV with mildly decreased systolic function, no myocardial LGE.  - Echo (8/19): EF 30%, diffuse hypokinesis, mildly dilated RV with normal systolic function, PASP 40 mmHg.  - Echo (11/19): EF 35%, diffuse hypokinesis, mildly dilated RV with normal systolic function.  - Echo (4/20): EF 40% with mild LV dilation and mild LVH, normal RV, normal IVC.  5. Sleep study (9/19): No OSA.   Past Surgical History:  Procedure Laterality Date  . CARDIAC CATHETERIZATION  01/12/2018     Current Outpatient Medications  Medication Sig Dispense Refill  . carvedilol (COREG) 25 MG tablet Take 1 tablet (25 mg total) by mouth 2 (two) times daily with a meal. 60 tablet 5  . dapagliflozin propanediol (FARXIGA) 10 MG TABS tablet Take 10 mg by mouth daily. 90 tablet 3  . ferrous sulfate 325 (65 FE) MG tablet Take 1 tablet (325 mg total) by mouth daily with breakfast. 30 tablet 3  . furosemide (LASIX) 40 MG tablet Take 0.5 tablets (20 mg total) by mouth daily. 15 tablet 11  . gabapentin (NEURONTIN) 300 MG capsule Take by mouth.    Marland Kitchen ibuprofen (ADVIL,MOTRIN) 200 MG tablet Take 200 mg by mouth every 6 (six) hours as needed for headache.    Marland Kitchen  potassium chloride SA (K-DUR) 20 MEQ tablet Take 2 tablets by mouth once daily 60 tablet 0  . sacubitril-valsartan (ENTRESTO) 97-103 MG Take 1 tablet by mouth 2 (two) times daily. 60 tablet 5  . spironolactone (ALDACTONE) 25 MG tablet Take 1 tablet (25 mg total) by mouth daily. 30 tablet 10  . Teriflunomide (AUBAGIO) 14 MG TABS Take 14 mg by mouth daily.      No current facility-administered medications for this encounter.     Allergies:   Patient has no known allergies.   Social History:  The patient  reports that she has never smoked. She has never used  smokeless tobacco. She reports that she does not drink alcohol or use drugs.   Family History:  The patient's family history includes Diabetes in her mother; Fibroids in her sister; Hypertension in her mother.   ROS:  Please see the history of present illness.   All other systems are personally reviewed and negative.   Exam:  (Video/Tele Health Call; Exam is subjective and or/visual.) General:  Speaks in full sentences. No resp difficulty. Lungs: Normal respiratory effort with conversation.  Abdomen: Non-distended per patient report Extremities: Pt denies edema. Neuro: Alert & oriented x 3.  Neck: No JVD  Recent Labs: 03/11/2019: BUN 9; Creatinine, Ser 0.68; Potassium 4.3; Sodium 137  Personally reviewed   Wt Readings from Last 3 Encounters:  03/11/19 (!) 149.1 kg (328 lb 9.6 oz)  07/27/18 (!) 137.9 kg (304 lb 0.2 oz)  07/16/18 (!) 138.3 kg (305 lb)      ASSESSMENT AND PLAN:  1. Chronic systolic CHF: Echo 0000000 with EF 10-15%, moderate MR. Nonischemic cardiomyopathy with no coronary disease on 6/19 cath. 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 Mother developed CHF in her 7s but no other family members have CHF that she knows of (has several healthy siblings). She does not drink ETOH or use drugs. No children, has not been pregnant so not peri-partum CMP. TSH normal.  RHC 01/12/18 with normal filling pressures and preserved CO. Cardiac MRI 01/12/18 did not show LGE, EF 14%.Repeat echo 11/19 showed EF up to 35%.  Echo in 4/20 showed EF 40%.  NYHA class II symptoms.  Weight is stable at home. - Continue Lasix 20 mg daily.  I will arrange for a BMET.   - Continue Entresto 97/103 bid. - Continue spironolactone 25 daily.  - Continue Coreg 25 mg bid.  - Continue dapagliflozin 10 mg daily.  - I do not think that she will be able to tolerate even a low dose of Bidil.   - EF is now out of ICD range.  2. History of uterine fibroids with bleeding/anemia: Stable currently.  3. Multiple  sclerosis: Aubagio, her MS medication, does not appear to have CHF as a side effect. Only cardiac side effect appears to be HTN.   4. Snores: Sleep study was negative.   5. Obesity: We again discussed weight loss with diet and exercise.  - I have referred her to the [facility] weight loss clinic with Dr. Leafy Ro, she plans to start in the new year due to issues with her insurance.   COVID screen The patient does not have any symptoms that suggest any further testing/ screening at this time.  Social distancing reinforced today.  Patient Risk: After full review of this patients clinical status, I feel that they are at moderate risk for cardiac decompensation at this time.  Relevant cardiac medications were reviewed at length with the patient today. The  patient does not have concerns regarding their medications at this time.   Recommended follow-up:  3 months.   Today, I have spent 16 minutes with the patient with telehealth technology discussing the above issues .    Signed, Loralie Champagne, MD  07/12/2019  [facility] [address] [facility] [place] [address] [phone] (office) [phone] (fax)

## 2019-07-12 NOTE — Progress Notes (Signed)
Orders per Dr Aundra Dubin:  1. Bmet in next couple of weeks 2. F/u 3 months  Spoke w/pt via phone, she is aware and agreeable, appts scheduled, AVS and appt card mailed to pt.  She request refills on Spiro and Lasix, refills sent in.

## 2019-07-12 NOTE — Patient Instructions (Signed)
Refills for Spironolactone and Furosemide have been sent in for you  Lab work is scheduled for Monday 07/26/2019 at 9:30 AM, parking code [place] recommends that you schedule a follow-up appointment in: 3 months:  Friday 10/01/2019 at 9:40 AM, parking code [facility]  If you have any questions or concerns before your next appointment please send [place] a message through [place] or call our office at [phone].  At the [facility], you and your health needs are our priority. As part of our continuing mission to provide you with exceptional heart care, we have created designated Provider Care Teams. These Care Teams include your primary Cardiologist (physician) and Advanced Practice Providers (APPs- Physician Assistants and Nurse Practitioners) who all work together to provide you with the care you need, when you need it.   You may see any of the following providers on your designated Care Team at your next follow up: Marland Kitchen Dr Glori Bickers . Dr Loralie Champagne . Darrick Grinder, NP . Lyda Jester, PA   Please be sure to bring in all your medications bottles to every appointment.

## 2019-07-13 ENCOUNTER — Telehealth (HOSPITAL_COMMUNITY): Payer: Self-pay

## 2019-07-13 NOTE — Telephone Encounter (Signed)
Received a Request for Medical records from [place]. Faxed to [phone].

## 2019-07-20 DIAGNOSIS — G35 Multiple sclerosis: Secondary | ICD-10-CM | POA: Diagnosis not present

## 2019-07-20 DIAGNOSIS — Z719 Counseling, unspecified: Secondary | ICD-10-CM | POA: Diagnosis not present

## 2019-07-26 ENCOUNTER — Ambulatory Visit (HOSPITAL_COMMUNITY)
Admission: RE | Admit: 2019-07-26 | Discharge: 2019-07-26 | Disposition: A | Discharge status: Home / Self Care | Payer: BC Managed Care – PPO | Source: Ambulatory Visit | Attending: Cardiology | Admitting: Cardiology

## 2019-07-26 ENCOUNTER — Other Ambulatory Visit: Payer: Self-pay

## 2019-07-26 ENCOUNTER — Telehealth (HOSPITAL_COMMUNITY): Payer: Self-pay

## 2019-07-26 DIAGNOSIS — I5022 Chronic systolic (congestive) heart failure: Secondary | ICD-10-CM | POA: Diagnosis not present

## 2019-07-26 LAB — BASIC METABOLIC PANEL
Anion gap: 10 (ref 5–15)
BUN: 11 mg/dL (ref 6–20)
CO2: 20 mmol/L — ABNORMAL LOW (ref 22–32)
Calcium: 8.6 mg/dL — ABNORMAL LOW (ref 8.9–10.3)
Chloride: 109 mmol/L (ref 98–111)
Creatinine, Ser: 0.74 mg/dL (ref 0.44–1.00)
GFR calc Af Amer: >60 mL/min (ref >60)
GFR calc non Af Amer: >60 mL/min (ref >60)
Glucose, Bld: 109 mg/dL — ABNORMAL HIGH (ref 70–99)
Potassium: 5.1 mmol/L (ref 3.5–5.1)
Sodium: 139 mmol/L (ref 135–145)

## 2019-07-26 NOTE — Telephone Encounter (Signed)
-----   Message from Larey Dresser, MD sent at 07/26/2019 11:31 AM EST ----- Stop KCl, repeat BMET 1 week.

## 2019-07-26 NOTE — Telephone Encounter (Signed)
Pt aware of lab results. Advised tos top kcl. Will rtc in 1 week for lab work

## 2019-08-02 ENCOUNTER — Ambulatory Visit (HOSPITAL_COMMUNITY)
Admission: RE | Admit: 2019-08-02 | Discharge: 2019-08-02 | Disposition: A | Discharge status: Home / Self Care | Payer: BC Managed Care – PPO | Source: Ambulatory Visit | Attending: Internal Medicine | Admitting: Internal Medicine

## 2019-08-02 ENCOUNTER — Other Ambulatory Visit: Payer: Self-pay

## 2019-08-02 DIAGNOSIS — I5022 Chronic systolic (congestive) heart failure: Secondary | ICD-10-CM | POA: Insufficient documentation

## 2019-08-02 LAB — BASIC METABOLIC PANEL
Anion gap: 4 — ABNORMAL LOW (ref 5–15)
BUN: 12 mg/dL (ref 6–20)
CO2: 26 mmol/L (ref 22–32)
Calcium: 8.6 mg/dL — ABNORMAL LOW (ref 8.9–10.3)
Chloride: 109 mmol/L (ref 98–111)
Creatinine, Ser: 0.74 mg/dL (ref 0.44–1.00)
GFR calc Af Amer: >60 mL/min (ref >60)
GFR calc non Af Amer: >60 mL/min (ref >60)
Glucose, Bld: 112 mg/dL — ABNORMAL HIGH (ref 70–99)
Potassium: 3.7 mmol/L (ref 3.5–5.1)
Sodium: 139 mmol/L (ref 135–145)

## 2019-08-16 DIAGNOSIS — G35 Multiple sclerosis: Secondary | ICD-10-CM | POA: Diagnosis not present

## 2019-08-17 DIAGNOSIS — G35 Multiple sclerosis: Secondary | ICD-10-CM | POA: Diagnosis not present

## 2019-08-17 DIAGNOSIS — Z719 Counseling, unspecified: Secondary | ICD-10-CM | POA: Diagnosis not present

## 2019-09-02 IMAGING — DX DG CHEST 2V
2 series · 2 of 2 positions shown · non-contrast
Comparison: None.

CLINICAL DATA: Chest and left arm pain.

EXAM:
CHEST - 2 VIEW

[w chest pa]
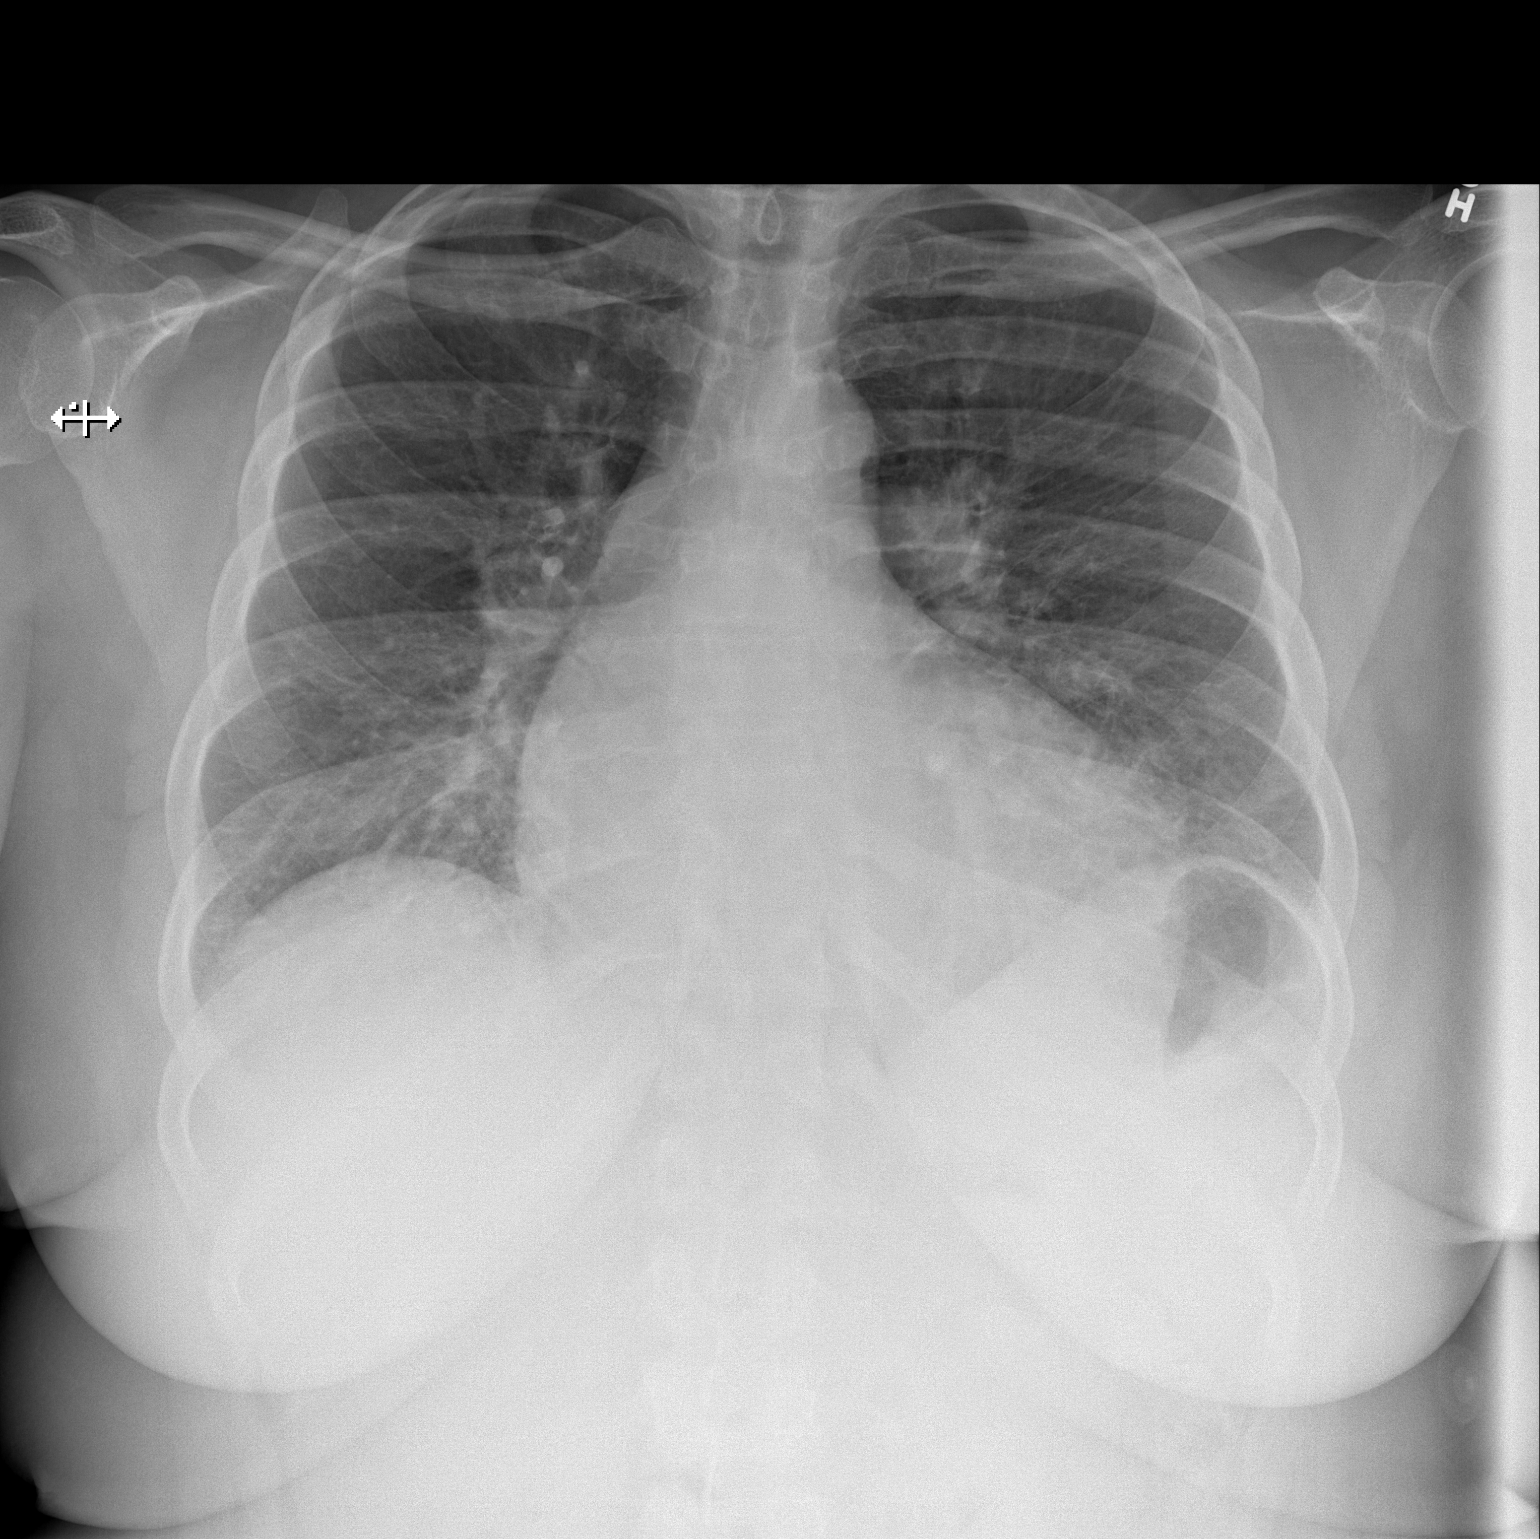

[w chest lat]
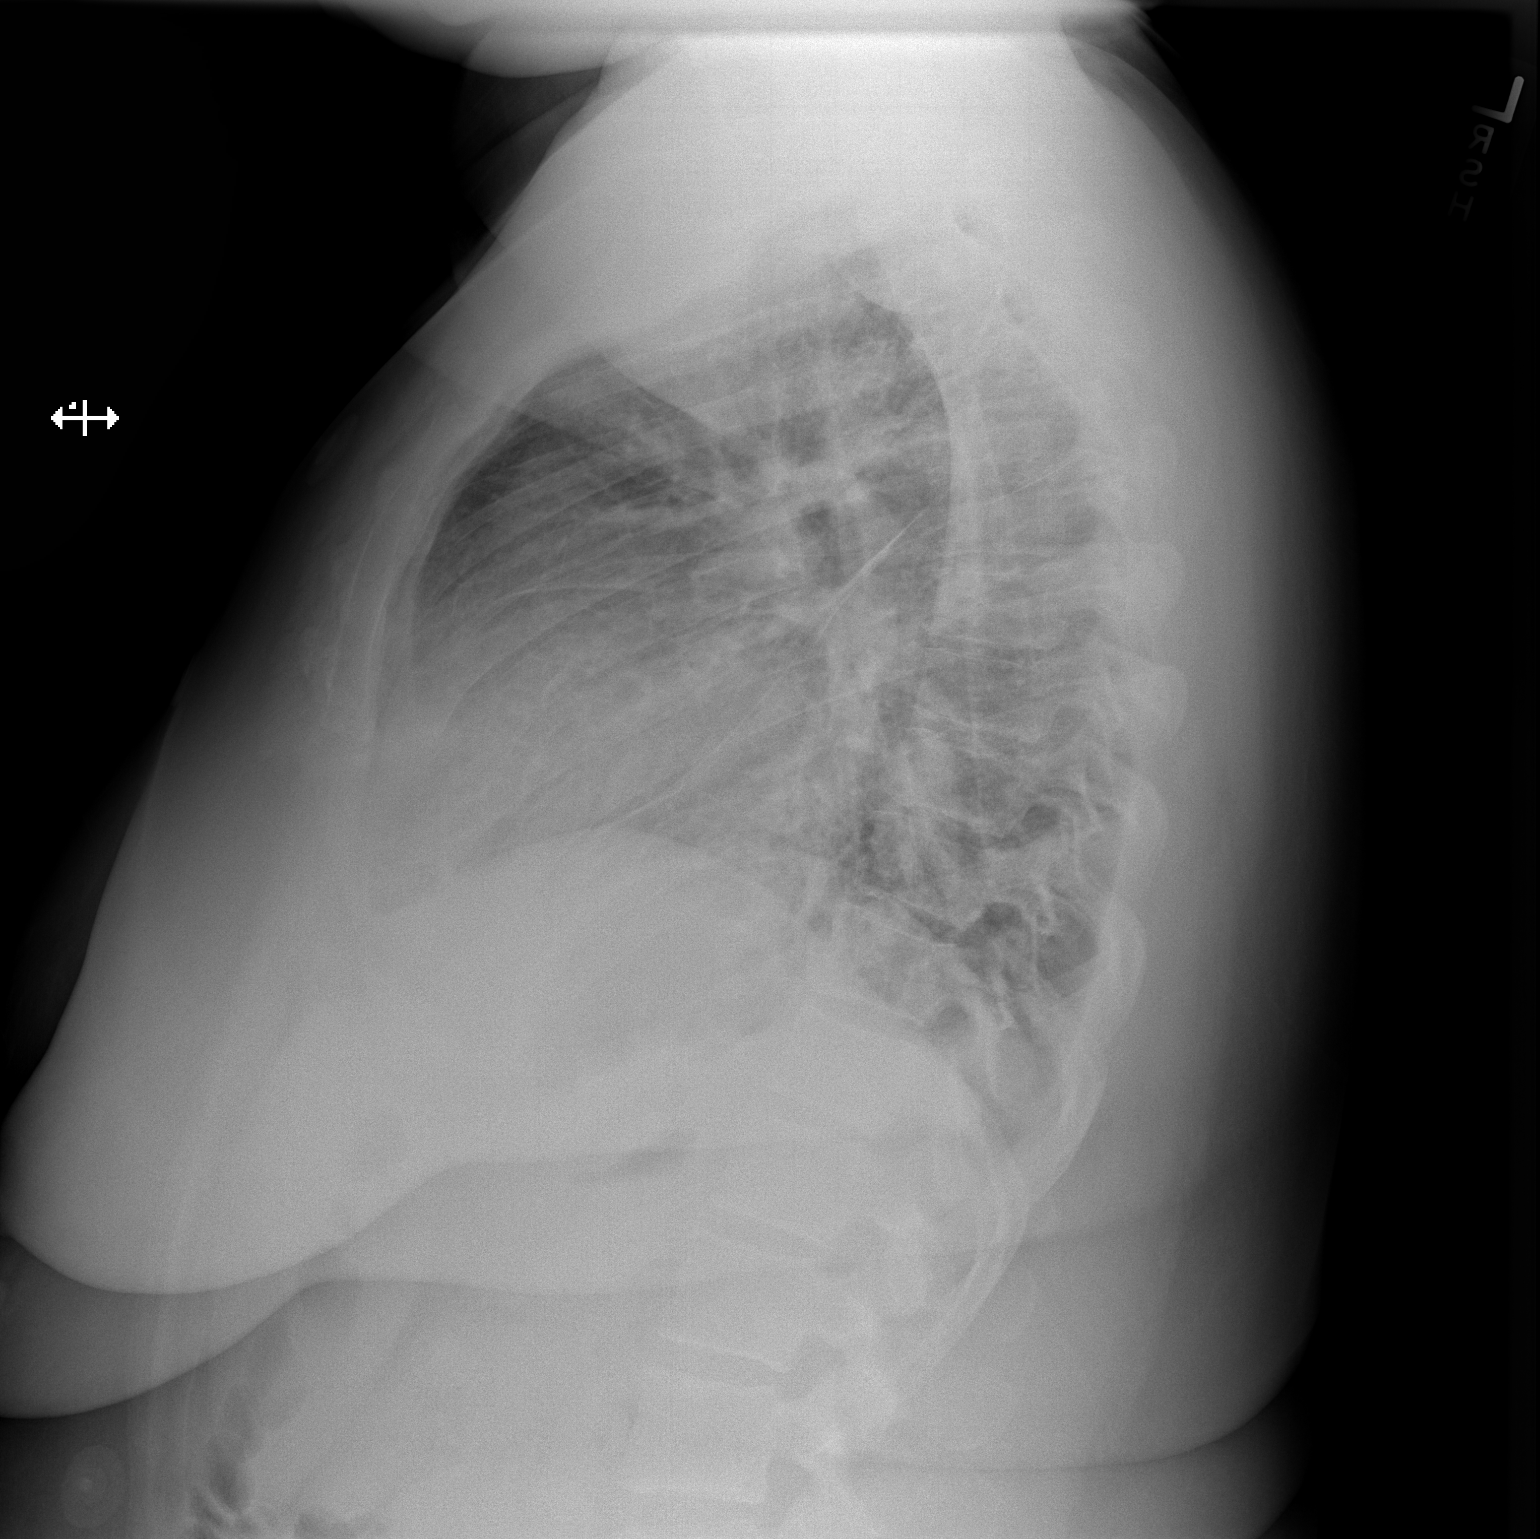

[2 of 2 positions shown; findings below may reference images not displayed]

FINDINGS: Patchy airspace opacity noted in the left lower lobe. Lung volumes
are low bilaterally. Insert upper no The visualized bony structures
of the thorax are intact.
IMPRESSION: Patchy airspace disease at the left base suggests pneumonia.

## 2019-09-20 IMAGING — CR DG CHEST 2V
2 series · 2 of 2 positions shown · non-contrast
Comparison: Two-view chest x-ray 12/19/2017

CLINICAL DATA: Recent pneumonia.  Shortness of breath.

EXAM:
CHEST - 2 VIEW

[w chest pa]
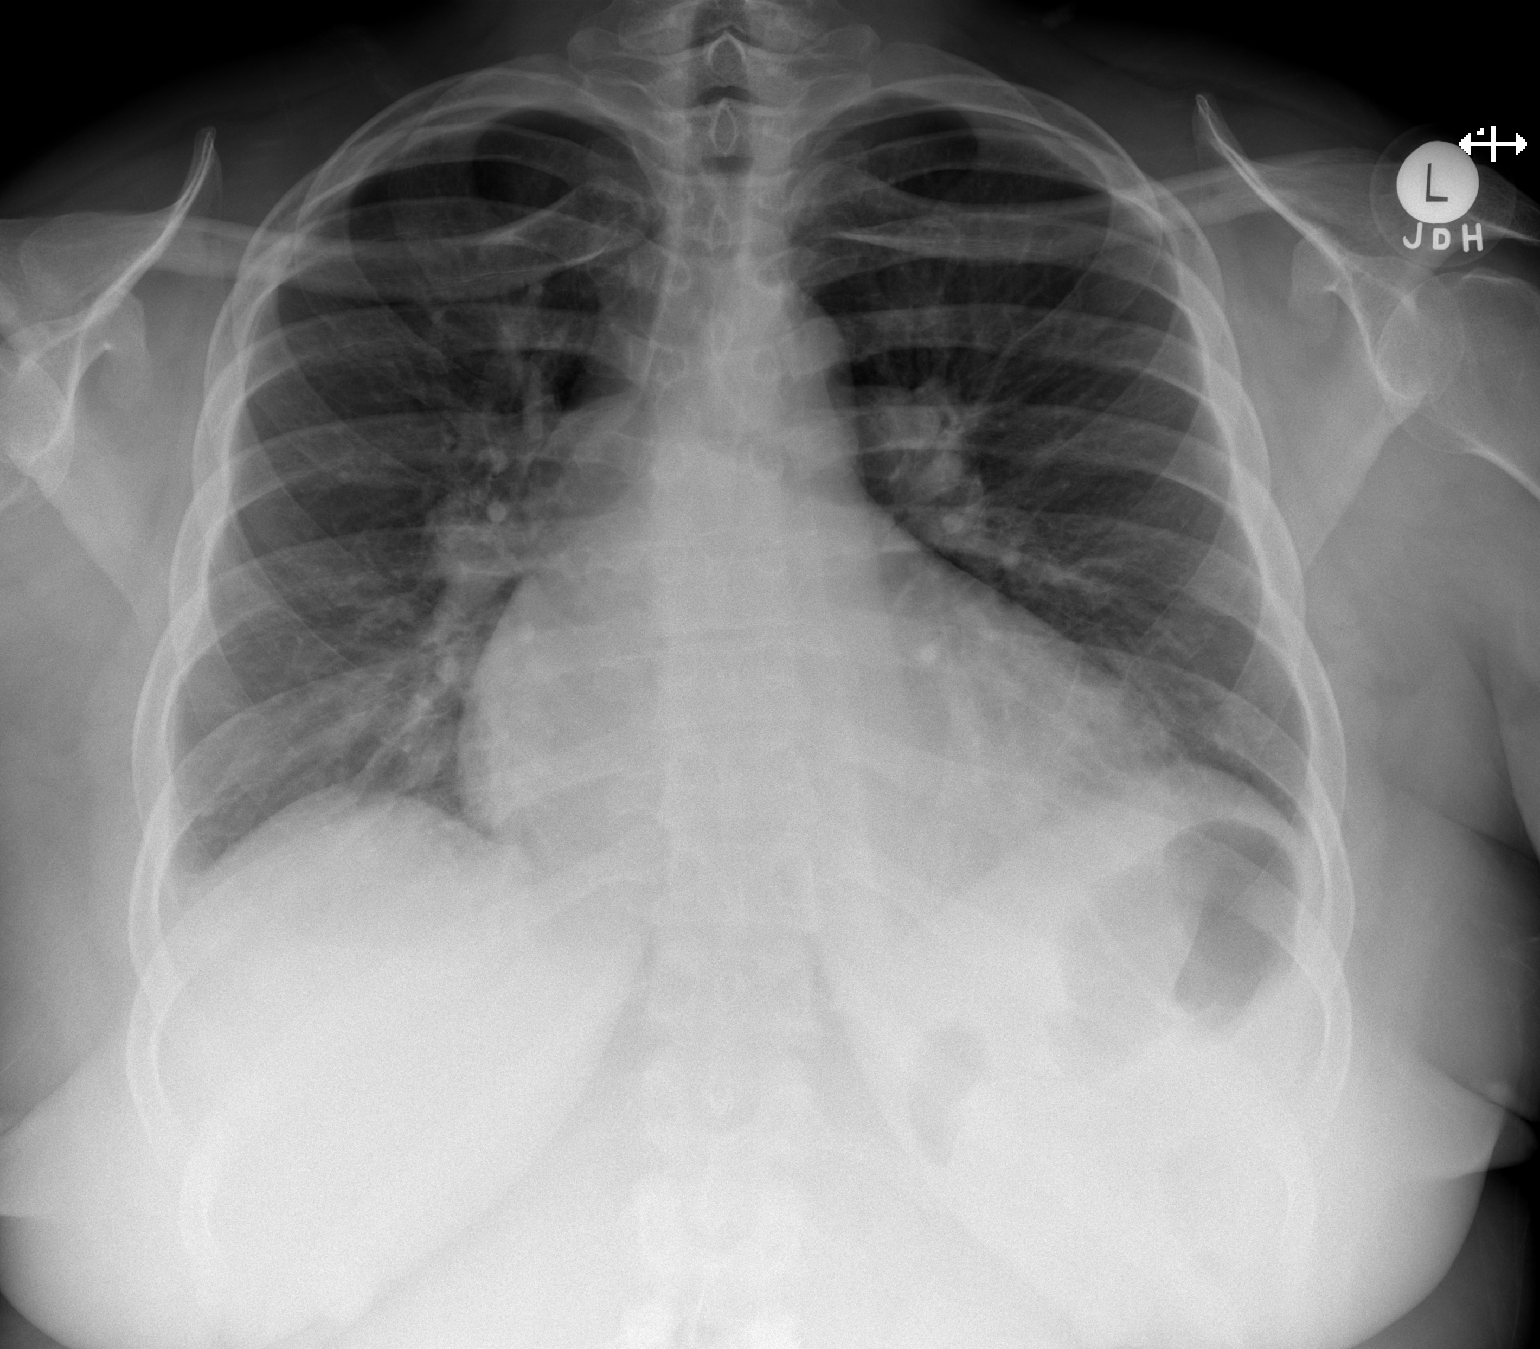

[w chest lat]
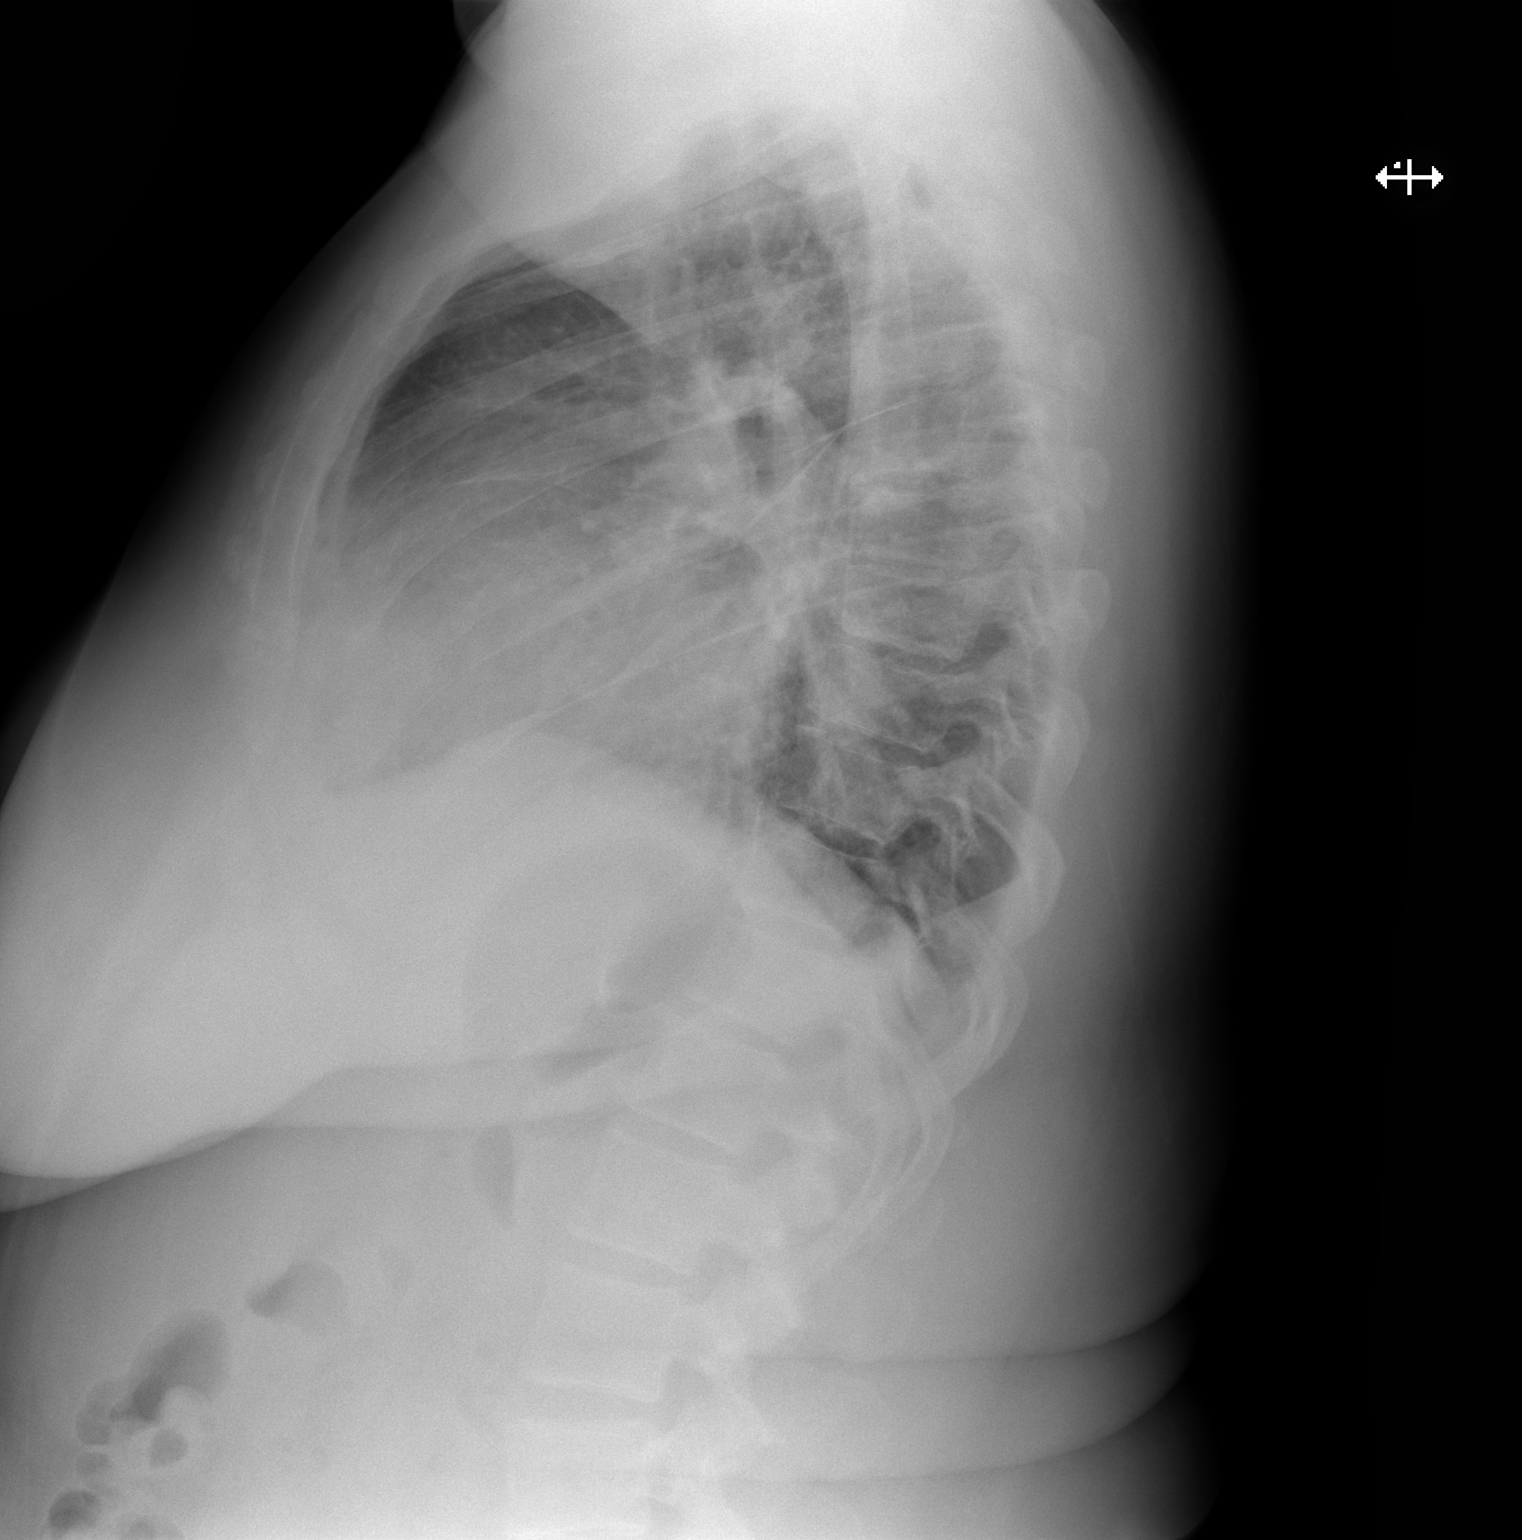

[2 of 2 positions shown; findings below may reference images not displayed]

FINDINGS: The heart is enlarged. Left lower lobe airspace disease is
improving. Mild pulmonary vascular congestion is present without
frank edema. The no significant effusions are present.
IMPRESSION: 1. Cardiomegaly and mild pulmonary vascular congestion.
2. Improving left lower lobe airspace disease without complete
resolution.

## 2019-09-23 IMAGING — CR DG CHEST 2V
2 series · 2 of 2 positions shown · non-contrast
Comparison: 01/06/2018.  12/19/2017.

CLINICAL DATA: Chest pain and shortness of breath with cough,
several months duration.

EXAM:
CHEST - 2 VIEW

[chest pa]
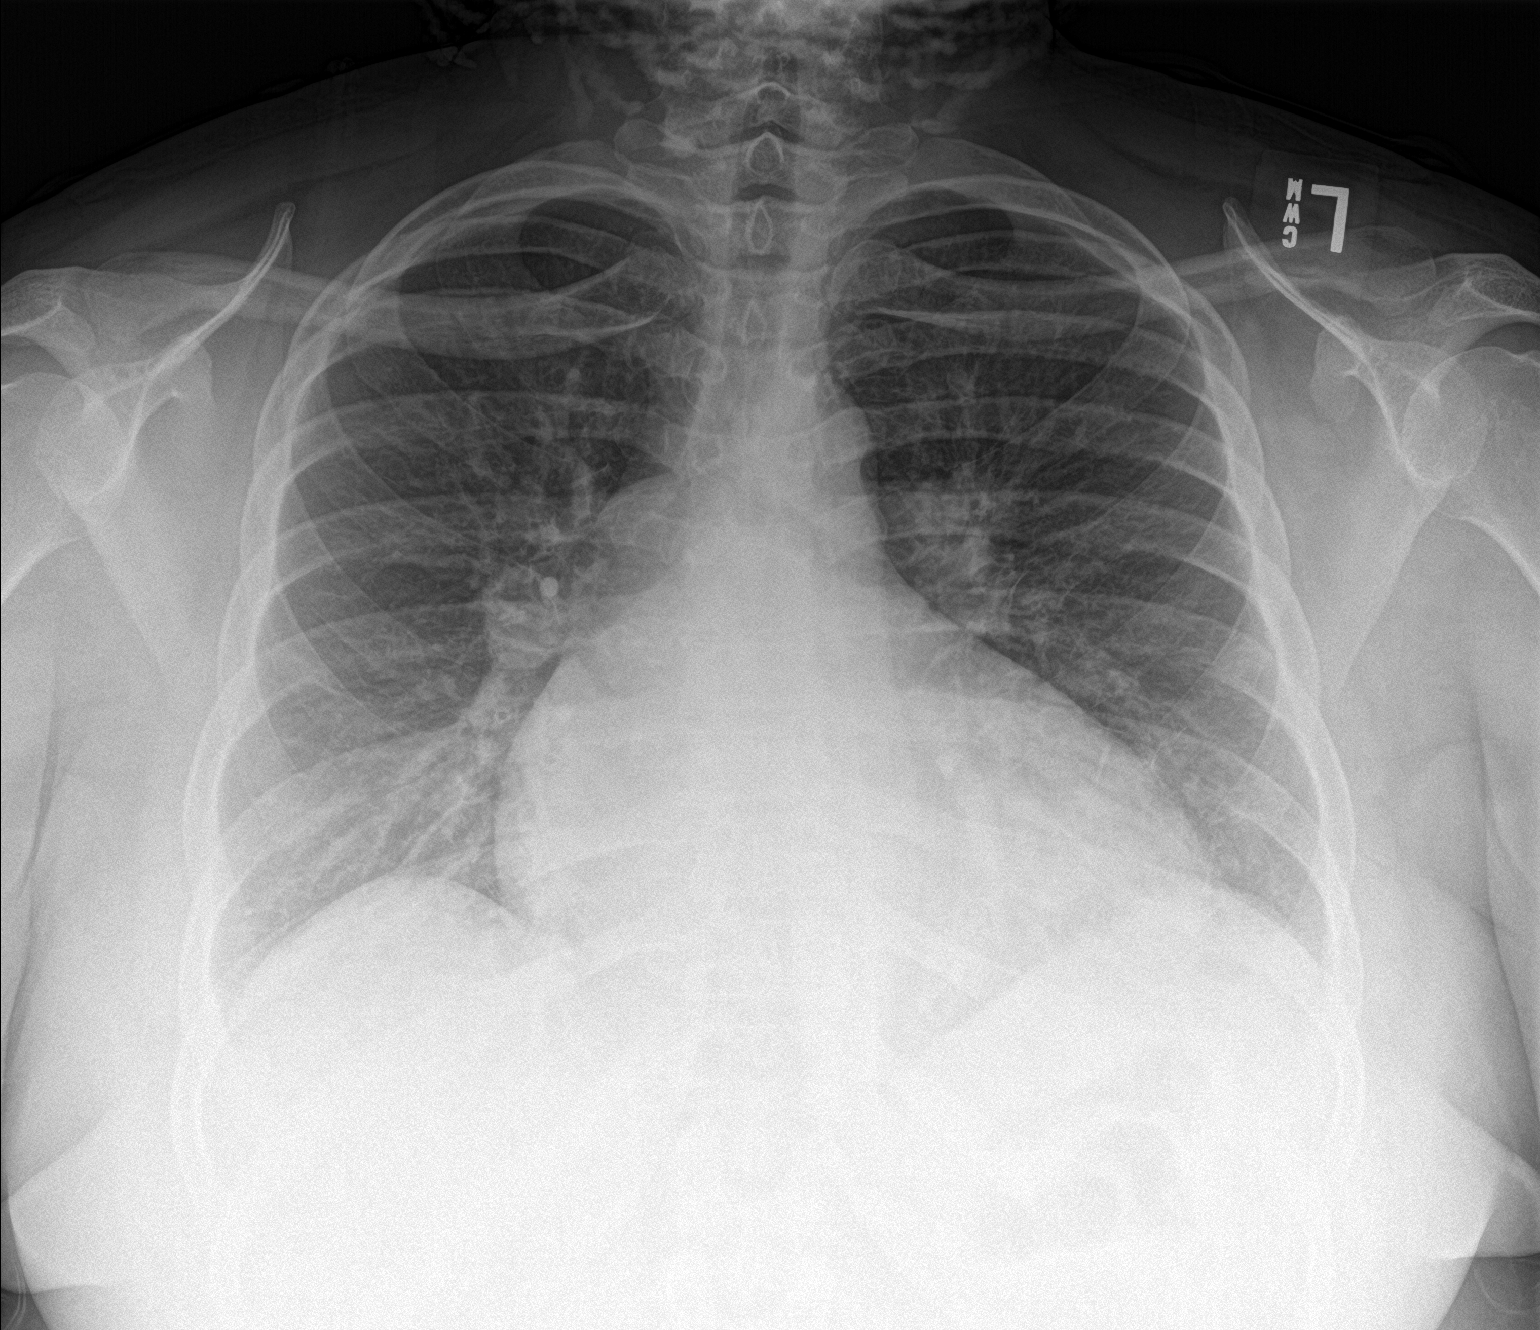

[chest lat]
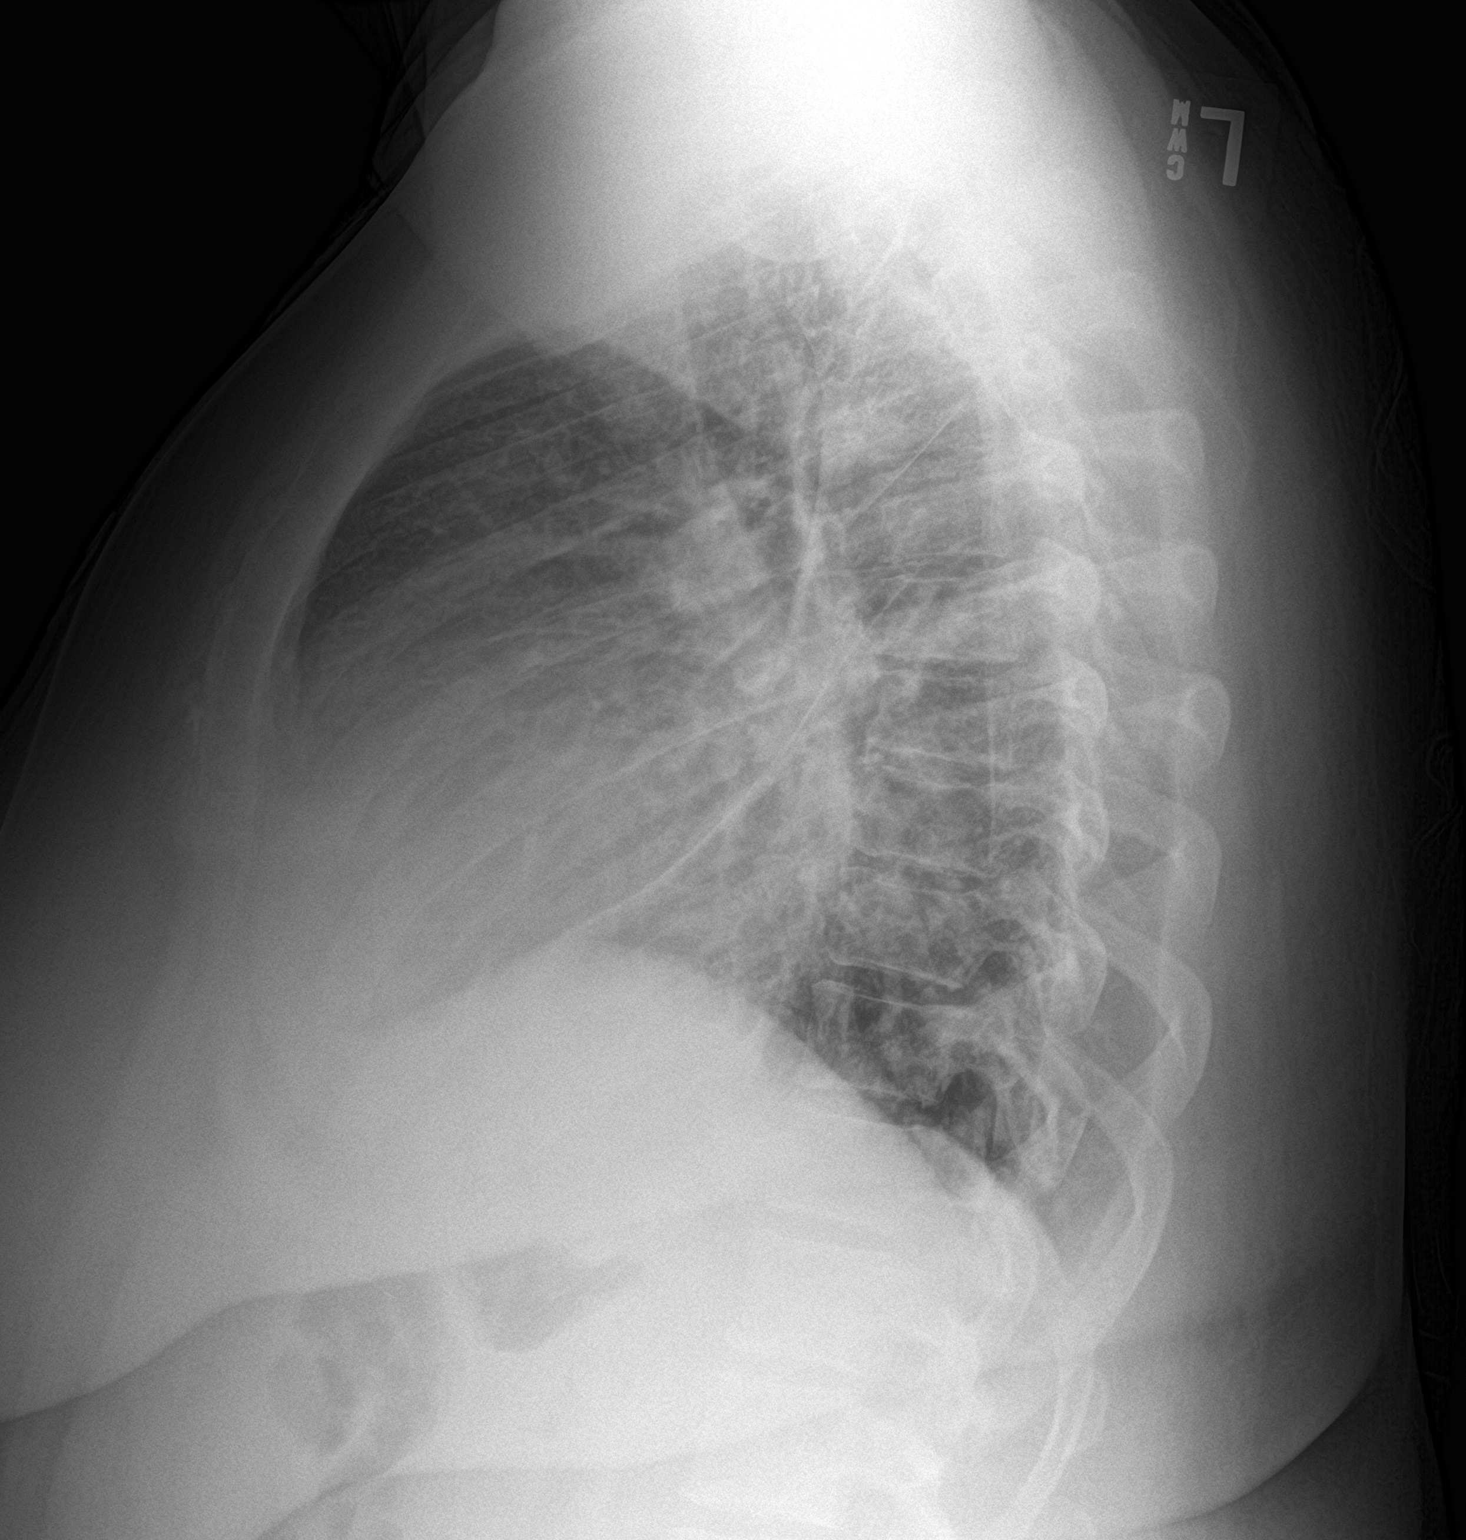

[2 of 2 positions shown; findings below may reference images not displayed]

FINDINGS: Cardiac silhouette is enlarged. Mediastinal shadows are otherwise
normal. There is central bronchial thickening. There is a small
amount of fluid in the fissures. No consolidation or collapse. No
significant bone finding.
IMPRESSION: Question mild CHF. Enlarged cardiac silhouette. This could be due to
cardiomegaly and/or pericardial fluid. Small amount of fluid in the
fissures.

Central bronchial thickening that could go along with bronchitis.

## 2019-10-01 ENCOUNTER — Encounter (HOSPITAL_COMMUNITY): Payer: BC Managed Care – PPO | Admitting: Cardiology

## 2019-10-04 ENCOUNTER — Telehealth (HOSPITAL_COMMUNITY): Payer: Self-pay

## 2019-10-04 NOTE — Telephone Encounter (Signed)

## 2019-10-05 ENCOUNTER — Ambulatory Visit (HOSPITAL_COMMUNITY)
Admission: RE | Admit: 2019-10-05 | Discharge: 2019-10-05 | Disposition: A | Discharge status: Home / Self Care | Payer: Self-pay | Source: Ambulatory Visit | Attending: Cardiology | Admitting: Cardiology

## 2019-10-05 ENCOUNTER — Other Ambulatory Visit: Payer: Self-pay

## 2019-10-05 VITALS — BP 122/64 | HR 93 | Wt 345.2 lb

## 2019-10-05 DIAGNOSIS — I5022 Chronic systolic (congestive) heart failure: Secondary | ICD-10-CM | POA: Insufficient documentation

## 2019-10-05 DIAGNOSIS — I5021 Acute systolic (congestive) heart failure: Secondary | ICD-10-CM

## 2019-10-05 DIAGNOSIS — Z79899 Other long term (current) drug therapy: Secondary | ICD-10-CM | POA: Insufficient documentation

## 2019-10-05 DIAGNOSIS — D259 Leiomyoma of uterus, unspecified: Secondary | ICD-10-CM | POA: Insufficient documentation

## 2019-10-05 DIAGNOSIS — I428 Other cardiomyopathies: Secondary | ICD-10-CM | POA: Insufficient documentation

## 2019-10-05 DIAGNOSIS — Z7901 Long term (current) use of anticoagulants: Secondary | ICD-10-CM | POA: Insufficient documentation

## 2019-10-05 DIAGNOSIS — R42 Dizziness and giddiness: Secondary | ICD-10-CM | POA: Insufficient documentation

## 2019-10-05 DIAGNOSIS — D649 Anemia, unspecified: Secondary | ICD-10-CM | POA: Insufficient documentation

## 2019-10-05 DIAGNOSIS — Z8249 Family history of ischemic heart disease and other diseases of the circulatory system: Secondary | ICD-10-CM | POA: Insufficient documentation

## 2019-10-05 DIAGNOSIS — E669 Obesity, unspecified: Secondary | ICD-10-CM | POA: Insufficient documentation

## 2019-10-05 DIAGNOSIS — Z6841 Body Mass Index (BMI) 40.0 and over, adult: Secondary | ICD-10-CM | POA: Insufficient documentation

## 2019-10-05 DIAGNOSIS — G35 Multiple sclerosis: Secondary | ICD-10-CM | POA: Insufficient documentation

## 2019-10-05 LAB — BASIC METABOLIC PANEL
Anion gap: 7 (ref 5–15)
BUN: 6 mg/dL (ref 6–20)
CO2: 26 mmol/L (ref 22–32)
Calcium: 8.8 mg/dL — ABNORMAL LOW (ref 8.9–10.3)
Chloride: 106 mmol/L (ref 98–111)
Creatinine, Ser: 0.64 mg/dL (ref 0.44–1.00)
GFR calc Af Amer: >60 mL/min (ref >60)
GFR calc non Af Amer: >60 mL/min (ref >60)
Glucose, Bld: 111 mg/dL — ABNORMAL HIGH (ref 70–99)
Potassium: 4 mmol/L (ref 3.5–5.1)
Sodium: 139 mmol/L (ref 135–145)

## 2019-10-05 NOTE — Patient Instructions (Signed)
Use over the counter medication for vertigo   Labs today We will only contact you if something comes back abnormal or we need to make some changes. Otherwise no news is good news!    Your physician has requested that you have an echocardiogram. Echocardiography is a painless test that uses sound waves to create images of your heart. It provides your doctor with information about the size and shape of your heart and how well your heart's chambers and valves are working. This procedure takes approximately one hour. There are no restrictions for this procedure.   Your physician recommends that you schedule a follow-up appointment in: 3 months with Dr Aundra Dubin with an Echo   At the [facility], you and your health needs are our priority. As part of our continuing mission to provide you with exceptional heart care, we have created designated Provider Care Teams. These Care Teams include your primary Cardiologist (physician) and Advanced Practice Providers (APPs- Physician Assistants and Nurse Practitioners) who all work together to provide you with the care you need, when you need it.   You may see any of the following providers on your designated Care Team at your next follow up: Marland Kitchen Dr Glori Bickers . Dr Loralie Champagne . Darrick Grinder, NP . Lyda Jester, PA . Audry Riles, PharmD   Please be sure to bring in all your medications bottles to every appointment.

## 2019-10-06 NOTE — Progress Notes (Signed)
Date:  07/12/2019   ID:  Amanda [name], DOB 08-13-1980, MRN [id number]   Provider location: [place] [facility] Type of Visit: Established patient   PCP:  [name], PA  Cardiologist:  Dr. Aundra Dubin.    History of Present Illness: Amanda [name] is a 39 y.o. who has a history of multiple sclerosis, anemia due to uterine fibroids, and systolic HF (dx XX123456).  Admitted to [facility] 5/31-01/13/18  with volume overload and found to have acute systolic heart failure. Echo showed EF 10-15%. HF team consulted. R/LHC completed, which showed no CAD and preserved CO. Cardiac MRI showed no LGE, EF 14%. She was diuresed with IV lasix and transitioned to lasix 40 mg daily. HF meds were optimized. DC weight: 299 lbs.  Echo in 11/18 showed EF 35% with diffuse hypokinesis.   She has been unable to tolerate even low dose Bidil.   Echo in 4/20 showed EF 40%, mild LVH, normal RV, normal IVC.   Main complaint today is a sensation of "spinning" if she turns her head a particular way.  No nausea/vomiting.  This began last week.  Weight is up 17 lbs. She has not been exercising or eating well.  No significant exertional dyspnea, orthopnea/PND.  No chest pain. Of note, she felt "shaky" when she took [place] and is concerned about the side effects.   Labs (6/19): K 4.3, creatinine 0.88 Labs (10/19): K 3.4, creatinine 0.8 Labs (11/19): K 4.1, creatinine 0.74 Labs (4/20): K 4.1, creatinine 0.78 Labs (7/20): K 4.3, creatinine 0.68 Labs (12/20): K 3.7, creatinine 0.74  PMH:  1. Multiple sclerosis 2. Uterine fibroids 3. Anemia: Likely related to fibroids.  4. Chronic systolic CHF: Diagnosed in 5/19.  Nonischemic cardiomyopathy.  - Echo (6/19) with EF 10-15%.  - RHC/LHC (6/19): mean RA 5, PA 30/13 mean 22, mean PCWP 9, CI 2.63.  No angiographic CAD.  - Cardiac MRI (6/19): EF 14% with severely dilated LV, mildly dilated RV with mildly decreased systolic function, no myocardial LGE.  - Echo  (8/19): EF 30%, diffuse hypokinesis, mildly dilated RV with normal systolic function, PASP 40 mmHg.  - Echo (11/19): EF 35%, diffuse hypokinesis, mildly dilated RV with normal systolic function.  - Echo (4/20): EF 40% with mild LV dilation and mild LVH, normal RV, normal IVC.  5. Sleep study (9/19): No OSA.  6. BPPV  Past Surgical History:  Procedure Laterality Date  . CARDIAC CATHETERIZATION  01/12/2018     Current Outpatient Medications  Medication Sig Dispense Refill  . carvedilol (COREG) 25 MG tablet Take 1 tablet (25 mg total) by mouth 2 (two) times daily with a meal. 60 tablet 5  . ferrous sulfate 325 (65 FE) MG tablet Take 1 tablet (325 mg total) by mouth daily with breakfast. 30 tablet 3  . furosemide (LASIX) 40 MG tablet Take 0.5 tablets (20 mg total) by mouth daily. 15 tablet 11  . gabapentin (NEURONTIN) 300 MG capsule Take by mouth.    Marland Kitchen ibuprofen (ADVIL,MOTRIN) 200 MG tablet Take 200 mg by mouth every 6 (six) hours as needed for headache.    . sacubitril-valsartan (ENTRESTO) 97-103 MG Take 1 tablet by mouth 2 (two) times daily. 60 tablet 5  . spironolactone (ALDACTONE) 25 MG tablet Take 1 tablet (25 mg total) by mouth daily. 30 tablet 10  . Teriflunomide (AUBAGIO) 14 MG TABS Take 14 mg by mouth daily.      No current facility-administered medications for this  encounter.    Allergies:   Patient has no known allergies.   Social History:  The patient  reports that she has never smoked. She has never used smokeless tobacco. She reports that she does not drink alcohol or use drugs.   Family History:  The patient's family history includes Diabetes in her mother; Fibroids in her sister; Hypertension in her mother.   ROS:  Please see the history of present illness.   All other systems are personally reviewed and negative.   Exam:   BP 122/64   Pulse 93   Wt (!) 156.6 kg (345 lb 4 oz)   SpO2 99%   BMI 54.07 kg/m  General: NAD, obese.  Neck: No JVD, no thyromegaly or  thyroid nodule.  Lungs: Clear to auscultation bilaterally with normal respiratory effort. CV: Nondisplaced PMI.  Heart regular S1/S2, no S3/S4, no murmur.  No peripheral edema.  No carotid bruit.  Normal pedal pulses.  Abdomen: Soft, nontender, no hepatosplenomegaly, no distention.  Skin: Intact without lesions or rashes.  Neurologic: Alert and oriented x 3.  Psych: Normal affect. Extremities: No clubbing or cyanosis.  HEENT: Normal.   Recent Labs: 10/05/2019: BUN 6; Creatinine, Ser 0.64; Potassium 4.0; Sodium 139  Personally reviewed   Wt Readings from Last 3 Encounters:  10/05/19 (!) 156.6 kg (345 lb 4 oz)  03/11/19 (!) 149.1 kg (328 lb 9.6 oz)  07/27/18 (!) 137.9 kg (304 lb 0.2 oz)      ASSESSMENT AND PLAN:  1. Chronic systolic CHF: Echo 0000000 with EF 10-15%, moderate MR. Nonischemic cardiomyopathy with no coronary disease on 6/19 cath. 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 Mother developed CHF in her 9s but no other family members have CHF that she knows of (has several healthy siblings). She does not drink ETOH or use drugs. No children, has not been pregnant so not peri-partum CMP. TSH normal.  RHC 01/12/18 with normal filling pressures and preserved CO. Cardiac MRI 01/12/18 did not show LGE, EF 14%.Repeat echo 11/19 showed EF up to 35%.  Echo in 4/20 showed EF 40%.  NYHA class II symptoms.  Weight is up, but she does not appear volume overloaded.  - Continue Lasix 20 mg daily.  BMET today.  - Continue Entresto 97/103 bid. - Continue spironolactone 25 daily.  - Continue Coreg 25 mg bid.  - She stopped dapagliflozin ("felt funny") and does not want to retry it.  - I do not think that she will be able to tolerate even a low dose of Bidil.   - EF is now out of ICD range.  - I will get an echo at followlkdsklsdf 2. History of uterine fibroids with bleeding/anemia: Stable currently.  3. Multiple sclerosis: Aubagio, her MS medication, does not appear to have CHF as a side  effect. Only cardiac side effect appears to be HTN.   4. Vertigo: By description, suspect benign paroxysmal positional vertigo (BPPV).  - Try meclizine for now.  - If symptoms don't improve, could need vestibular rehab.    5. Obesity: We again discussed weight loss with diet and exercise.  - [facility].   Followup in 3 months with echo.   Signed, Loralie Champagne, MD  07/12/2019  [facility] [address] [facility] [place] [address] [phone] (office) [phone] (fax)

## 2019-10-20 ENCOUNTER — Other Ambulatory Visit (HOSPITAL_COMMUNITY): Payer: Self-pay | Admitting: Cardiology

## 2019-11-05 ENCOUNTER — Ambulatory Visit: Payer: Self-pay | Attending: Internal Medicine

## 2019-11-05 ENCOUNTER — Ambulatory Visit: Payer: Self-pay

## 2019-11-05 DIAGNOSIS — Z23 Encounter for immunization: Secondary | ICD-10-CM

## 2019-11-05 NOTE — Progress Notes (Signed)
   Covid-19 Vaccination Clinic  Name:  FARA BONTA    MRN: [id number] DOB: Mar 16, 1981  11/05/2019  Ms. Smalls was observed post Covid-19 immunization for 15 minutes without incident. She was provided with Vaccine Information Sheet and instruction to access the V-Safe system.   Ms. [place] was instructed to call 911 with any severe reactions post vaccine: Marland Kitchen Difficulty breathing  . Swelling of face and throat  . A fast heartbeat  . A bad rash all over body  . Dizziness and weakness   Immunizations Administered    Name Date Dose VIS Date Route   Pfizer COVID-19 Vaccine 11/05/2019 12:08 PM 0.3 mL 07/23/2019 Intramuscular   Manufacturer: [place]   Lot: [id number]   [place]: [id number]

## 2019-11-30 ENCOUNTER — Ambulatory Visit: Payer: Self-pay | Attending: Internal Medicine

## 2019-11-30 DIAGNOSIS — Z23 Encounter for immunization: Secondary | ICD-10-CM

## 2019-11-30 NOTE — Progress Notes (Signed)
   Covid-19 Vaccination Clinic  Name:  Amanda [name]    MRN: [id number] DOB: 1981/04/09  11/30/2019  [name] was observed post Covid-19 immunization for 15 minutes without incident. She was provided with Vaccine Information Sheet and instruction to access the V-Safe system.   [name] was instructed to call 911 with any severe reactions post vaccine: Marland Kitchen Difficulty breathing  . Swelling of face and throat  . A fast heartbeat  . A bad rash all over body  . Dizziness and weakness   Immunizations Administered    Name Date Dose VIS Date Route   Pfizer COVID-19 Vaccine 11/30/2019  8:50 AM 0.3 mL 10/06/2018 Intramuscular   Manufacturer: [place]   Lot: [id number]   [place]: [id number]

## 2019-12-29 DIAGNOSIS — G35 Multiple sclerosis: Secondary | ICD-10-CM | POA: Diagnosis not present

## 2020-01-03 ENCOUNTER — Encounter (HOSPITAL_COMMUNITY): Payer: Self-pay | Admitting: Cardiology

## 2020-01-03 ENCOUNTER — Ambulatory Visit (HOSPITAL_COMMUNITY)
Admission: RE | Admit: 2020-01-03 | Discharge: 2020-01-03 | Disposition: A | Discharge status: Home / Self Care | Payer: BC Managed Care – PPO | Source: Ambulatory Visit | Attending: Cardiology | Admitting: Cardiology

## 2020-01-03 ENCOUNTER — Ambulatory Visit (HOSPITAL_BASED_OUTPATIENT_CLINIC_OR_DEPARTMENT_OTHER)
Admission: RE | Admit: 2020-01-03 | Discharge: 2020-01-03 | Disposition: A | Discharge status: Home / Self Care | Payer: BC Managed Care – PPO | Source: Ambulatory Visit | Attending: Cardiology | Admitting: Cardiology

## 2020-01-03 ENCOUNTER — Other Ambulatory Visit: Payer: Self-pay

## 2020-01-03 VITALS — BP 128/89 | HR 79 | Wt 350.6 lb

## 2020-01-03 DIAGNOSIS — I5021 Acute systolic (congestive) heart failure: Secondary | ICD-10-CM

## 2020-01-03 DIAGNOSIS — Z6841 Body Mass Index (BMI) 40.0 and over, adult: Secondary | ICD-10-CM | POA: Diagnosis not present

## 2020-01-03 DIAGNOSIS — I5022 Chronic systolic (congestive) heart failure: Secondary | ICD-10-CM

## 2020-01-03 DIAGNOSIS — Z79899 Other long term (current) drug therapy: Secondary | ICD-10-CM | POA: Insufficient documentation

## 2020-01-03 DIAGNOSIS — G35 Multiple sclerosis: Secondary | ICD-10-CM | POA: Diagnosis not present

## 2020-01-03 DIAGNOSIS — Z7901 Long term (current) use of anticoagulants: Secondary | ICD-10-CM | POA: Insufficient documentation

## 2020-01-03 LAB — BASIC METABOLIC PANEL
Anion gap: 5 (ref 5–15)
BUN: 8 mg/dL (ref 6–20)
CO2: 25 mmol/L (ref 22–32)
Calcium: 8.3 mg/dL — ABNORMAL LOW (ref 8.9–10.3)
Chloride: 108 mmol/L (ref 98–111)
Creatinine, Ser: 0.69 mg/dL (ref 0.44–1.00)
GFR calc Af Amer: >60 mL/min (ref >60)
GFR calc non Af Amer: >60 mL/min (ref >60)
Glucose, Bld: 106 mg/dL — ABNORMAL HIGH (ref 70–99)
Potassium: 3.7 mmol/L (ref 3.5–5.1)
Sodium: 138 mmol/L (ref 135–145)

## 2020-01-03 NOTE — Patient Instructions (Signed)
NO medication changes!  You have been referred to Dr Leafy Ro with [facility]. Her office will call you to schedule this appointment  Your physician recommends that you schedule a follow-up appointment in: 4 months with Dr Aundra Dubin  Please call office at [phone] option 2 if you have any questions or concerns.    At the [facility], you and your health needs are our priority. As part of our continuing mission to provide you with exceptional heart care, we have created designated Provider Care Teams. These Care Teams include your primary Cardiologist (physician) and Advanced Practice Providers (APPs- Physician Assistants and Nurse Practitioners) who all work together to provide you with the care you need, when you need it.   You may see any of the following providers on your designated Care Team at your next follow up: Marland Kitchen Dr Glori Bickers . Dr Loralie Champagne . Darrick Grinder, NP . Lyda Jester, PA . Audry Riles, PharmD   Please be sure to bring in all your medications bottles to every appointment.

## 2020-01-03 NOTE — Progress Notes (Signed)
  Echocardiogram 2D Echocardiogram has been performed.  Amanda [name] 01/03/2020, 10:17 AM

## 2020-01-03 NOTE — Progress Notes (Signed)
ID:  Amanda [name], DOB [birth year], MRN [id number]   Provider location: [place] [facility] Type of Visit: Established patient   PCP:  [name], PA  Cardiologist:  Dr. Aundra Dubin.    History of Present Illness: Amanda [name] is a 39 y.o. who has a history of multiple sclerosis, anemia due to uterine fibroids, and systolic HF (dx XX123456).  Admitted to [facility] 5/31-01/13/18  with volume overload and found to have acute systolic heart failure. Echo showed EF 10-15%. HF team consulted. R/LHC completed, which showed no CAD and preserved CO. Cardiac MRI showed no LGE, EF 14%. She was diuresed with IV lasix and transitioned to lasix 40 mg daily. HF meds were optimized. DC weight: 299 lbs.  Echo in 11/18 showed EF 35% with diffuse hypokinesis.   She has been unable to tolerate even low dose Bidil.   Echo in 4/20 showed EF 40%, mild LVH, normal RV, normal IVC.   Echo was done today and reviewed, EF 45%, normal RV.   She returns today for followup of CHF.  She seems to be doing well.  She is walking for exercise, no dyspnea walking on flat ground.  Mild dyspnea walking up stairs.  She was on vacation at [facility] recently and developed peripheral edema, this resolved after she got home.  She has uterine fibroids that need removal.  Weight up 5 lbs.   Labs (6/19): K 4.3, creatinine 0.88 Labs (10/19): K 3.4, creatinine 0.8 Labs (11/19): K 4.1, creatinine 0.74 Labs (4/20): K 4.1, creatinine 0.78 Labs (7/20): K 4.3, creatinine 0.68 Labs (12/20): K 3.7, creatinine 0.74 Labs (2/21): K 4, creatinine 0.64  PMH:  1. Multiple sclerosis 2. Uterine fibroids 3. Anemia: Likely related to fibroids.  4. Chronic systolic CHF: Diagnosed in 5/19.  Nonischemic cardiomyopathy.  - Echo (6/19) with EF 10-15%.  - RHC/LHC (6/19): mean RA 5, PA 30/13 mean 22, mean PCWP 9, CI 2.63.  No angiographic CAD.  - Cardiac MRI (6/19): EF 14% with severely dilated LV, mildly dilated RV with mildly  decreased systolic function, no myocardial LGE.  - Echo (8/19): EF 30%, diffuse hypokinesis, mildly dilated RV with normal systolic function, PASP 40 mmHg.  - Echo (11/19): EF 35%, diffuse hypokinesis, mildly dilated RV with normal systolic function.  - Echo (4/20): EF 40% with mild LV dilation and mild LVH, normal RV, normal IVC.  - Echo (5/21): EF 45%, mild LVH, normal RV, normal IVC 5. Sleep study (9/19): No OSA.  6. BPPV  Past Surgical History:  Procedure Laterality Date  . CARDIAC CATHETERIZATION  01/12/2018     Current Outpatient Medications  Medication Sig Dispense Refill  . carvedilol (COREG) 25 MG tablet TAKE 1 TABLET BY MOUTH TWICE DAILY WITH A MEAL 180 tablet 0  . ENTRESTO 97-103 MG Take 1 tablet by mouth twice daily 180 tablet 0  . ferrous sulfate 325 (65 FE) MG tablet Take 1 tablet (325 mg total) by mouth daily with breakfast. 30 tablet 3  . furosemide (LASIX) 40 MG tablet Take 0.5 tablets (20 mg total) by mouth daily. 15 tablet 11  . gabapentin (NEURONTIN) 300 MG capsule Take 300 mg by mouth daily as needed.     Marland Kitchen ibuprofen (ADVIL,MOTRIN) 200 MG tablet Take 200 mg by mouth every 6 (six) hours as needed for headache.    . spironolactone (ALDACTONE) 25 MG tablet Take 1 tablet (25 mg total) by mouth daily. 30 tablet 10  .  Teriflunomide (AUBAGIO) 14 MG TABS Take 14 mg by mouth daily.      No current facility-administered medications for this encounter.    Allergies:   Patient has no known allergies.   Social History:  The patient  reports that she has never smoked. She has never used smokeless tobacco. She reports that she does not drink alcohol or use drugs.   Family History:  The patient's family history includes Diabetes in her mother; Fibroids in her sister; Hypertension in her mother.   ROS:  Please see the history of present illness.   All other systems are personally reviewed and negative.   Exam:   BP 128/89   Pulse 79   Wt (!) 159 kg (350 lb 9.6 oz)   SpO2  100%   BMI 54.91 kg/m  General: NAD, obese Neck: No JVD, no thyromegaly or thyroid nodule.  Lungs: Clear to auscultation bilaterally with normal respiratory effort. CV: Nondisplaced PMI.  Heart regular S1/S2, no S3/S4, no murmur.  No peripheral edema.  No carotid bruit.  Normal pedal pulses.  Abdomen: Soft, nontender, no hepatosplenomegaly, no distention.  Skin: Intact without lesions or rashes.  Neurologic: Alert and oriented x 3.  Psych: Normal affect. Extremities: No clubbing or cyanosis.  HEENT: Normal.   Recent Labs: 01/03/2020: BUN 8; Creatinine, Ser 0.69; Potassium 3.7; Sodium 138  Personally reviewed   Wt Readings from Last 3 Encounters:  01/03/20 (!) 159 kg (350 lb 9.6 oz)  10/05/19 (!) 156.6 kg (345 lb 4 oz)  03/11/19 (!) 149.1 kg (328 lb 9.6 oz)      ASSESSMENT AND PLAN:  1. Chronic systolic CHF: Echo 0000000 with EF 10-15%, moderate MR. Nonischemic cardiomyopathy with no coronary disease on 6/19 cath. 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 Mother developed CHF in her 36s but no other family members have CHF that she knows of (has several healthy siblings). She does not drink ETOH or use drugs. No children, has not been pregnant so not peri-partum CMP. TSH normal.  RHC 01/12/18 with normal filling pressures and preserved CO. Cardiac MRI 01/12/18 did not show LGE, EF 14%.Repeat echo 11/19 showed EF up to 35%.  Echo in 4/20 showed EF 40%.  Echo today was reviewed and shows EF up to 45%.  NYHA class II symptoms.  Weight is up, but she does not appear volume overloaded.  - Continue Lasix 20 mg daily.  BMET today.  - Continue Entresto 97/103 bid. - Continue spironolactone 25 daily.  - Continue Coreg 25 mg bid.  - She stopped dapagliflozin ("felt funny") and does not want to retry it.  - She did not tolerate even low dose Bidil.   - EF is now out of ICD range.  2. History of uterine fibroids with bleeding/anemia: At this point, I think that she would be at reasonable risk to  undergo fibroid surgery.  3. Obesity: We again discussed weight loss with diet and exercise.  - [facility] to [facility], she never got a call the last time we referred her.   Followup in 4 months.    Signed, Loralie Champagne, MD  01/03/2020  [facility] [address] [facility] [place] [address] [phone] (office) [phone] (fax)

## 2020-02-08 ENCOUNTER — Encounter (INDEPENDENT_AMBULATORY_CARE_PROVIDER_SITE_OTHER): Payer: Self-pay | Admitting: Family Medicine

## 2020-02-08 ENCOUNTER — Ambulatory Visit (INDEPENDENT_AMBULATORY_CARE_PROVIDER_SITE_OTHER): Payer: BC Managed Care – PPO | Admitting: Family Medicine

## 2020-02-08 ENCOUNTER — Other Ambulatory Visit: Payer: Self-pay

## 2020-02-08 VITALS — BP 120/86 | HR 88 | Temp 98.0°F | Ht 67.0 in | Wt 341.0 lb

## 2020-02-08 DIAGNOSIS — Z9189 Other specified personal risk factors, not elsewhere classified: Secondary | ICD-10-CM | POA: Diagnosis not present

## 2020-02-08 DIAGNOSIS — I509 Heart failure, unspecified: Secondary | ICD-10-CM | POA: Diagnosis not present

## 2020-02-08 DIAGNOSIS — F3289 Other specified depressive episodes: Secondary | ICD-10-CM

## 2020-02-08 DIAGNOSIS — Z862 Personal history of diseases of the blood and blood-forming organs and certain disorders involving the immune mechanism: Secondary | ICD-10-CM | POA: Insufficient documentation

## 2020-02-08 DIAGNOSIS — I10 Essential (primary) hypertension: Secondary | ICD-10-CM

## 2020-02-08 DIAGNOSIS — E559 Vitamin D deficiency, unspecified: Secondary | ICD-10-CM | POA: Diagnosis not present

## 2020-02-08 DIAGNOSIS — Z0289 Encounter for other administrative examinations: Secondary | ICD-10-CM

## 2020-02-08 DIAGNOSIS — Z6841 Body Mass Index (BMI) 40.0 and over, adult: Secondary | ICD-10-CM

## 2020-02-08 DIAGNOSIS — R5383 Other fatigue: Secondary | ICD-10-CM | POA: Diagnosis not present

## 2020-02-08 DIAGNOSIS — I5022 Chronic systolic (congestive) heart failure: Secondary | ICD-10-CM | POA: Insufficient documentation

## 2020-02-08 DIAGNOSIS — G35 Multiple sclerosis: Secondary | ICD-10-CM

## 2020-02-08 DIAGNOSIS — R0602 Shortness of breath: Secondary | ICD-10-CM | POA: Diagnosis not present

## 2020-02-08 DIAGNOSIS — E66813 Obesity, class 3: Secondary | ICD-10-CM

## 2020-02-08 DIAGNOSIS — Z86018 Personal history of other benign neoplasm: Secondary | ICD-10-CM

## 2020-02-08 DIAGNOSIS — F32A Depression, unspecified: Secondary | ICD-10-CM | POA: Insufficient documentation
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
## 2020-02-08 NOTE — Progress Notes (Signed)
Dear Dr. Aundra Dubin,   Thank you for referring Amanda [name] to our clinic. The following note includes my evaluation and treatment recommendations.  Chief Complaint:   OBESITY [name] (MR# [id number]) is a 39 y.o. female who presents for evaluation and treatment of obesity and related comorbidities. Current BMI is Body mass index is 53.41 kg/m. Amanda [name] has been struggling with her weight for many years and has been unsuccessful in either losing weight, maintaining weight loss, or reaching her healthy weight goal.  Amanda [name] is currently in the action stage of change and ready to dedicate time achieving and maintaining a healthier weight. Analyssa is interested in becoming our patient and working on intensive lifestyle modifications including (but not limited to) diet and exercise for weight loss.  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  She eats outside the home 3+ times per week.  She usually eats fast foods 2+ times pr week.  She desires to lose weight so they can "start a family", among other reasons.  She was referred to [place] by Dr. Aundra Dubin (Cardiologist).  She previously saw Dr. Jannifer Franklin, Neurology, at [facility] in the past, and now sees a doctor in [facility], Dr. Azucena Freed.  Her PCP is Marilynne Drivers, [place], of [facility].  Her last job was in 2019.  Her husband will be eating healthy with her.  Amanda [name]'s habits were reviewed today and are as follows: Her family eats meals together, she thinks her family will eat healthier with her, her desired weight loss is 145 pounds, she has been heavy most of her life, her heaviest weight ever was 363 pounds, she is a picky eater and doesn't like to eat healthier foods, she craves sweets, she snacks frequently in the evenings, she skips breakfast and/or lunch sometimes, she frequently makes poor food choices and she frequently eats larger portions than normal.  Depression Screen Amanda [name]'s Food and Mood (modified PHQ-9) score was 10.  Depression screen PHQ 2/9  02/08/2020  Decreased Interest 1  Down, Depressed, Hopeless 1  PHQ - 2 Score 2  Altered sleeping 2  Tired, decreased energy 2  Change in appetite 1  Feeling bad or failure about yourself  1  Trouble concentrating 2  Moving slowly or fidgety/restless 0  Suicidal thoughts 0  PHQ-9 Score 10   Subjective:   1. Other fatigue Sabel admits to daytime somnolence and denies waking up still tired. Patent has a history of symptoms of daytime fatigue. Amanda [name] generally gets 5 hours of sleep per night, and states that she has poor quality sleep. Snoring is not present. Apneic episodes are not present. Epworth Sleepiness Score is 4.  - h/o Sleep study (9/19): No OSA.   2. Shortness of breath on exertion Tahtiana notes increasing shortness of breath with exercising and seems to be worsening over time with weight gain. She notes getting out of breath sooner with activity than she used to. This has gotten worse recently. Amanda [name] denies shortness of breath at rest or orthopnea.   3. Congestive heart failure, unspecified HF chronicity, unspecified heart failure type ([place]) Amanda [name] has the diagnosis of CHF and is followed by Dr. Aundra Dubin in Cardiology.  She takes Coreg, Entresto, Lasix, and spironolactone.  - systolic HF (dx in 0/6269).    - h/o Cardiac Cath- 6/19   - Recent Echo (5/21): EF 45%;  mild LV decreased function with global hypokinesis.  mild LVH, Grade I diastolic dysfunction, normal RV, normal IVC.  4. Multiple sclerosis ([place]) Rendi suffers from chronic dizziness/neuro dysfunction.  She was diagnosed in 2009.  She sees Neurology regularly (every 6 months).  She sees a neurologist is in [facility] now.   5. Essential hypertension Review: taking medications as instructed, no medication side effects noted, no chest pain on exertion, no dyspnea on exertion, no swelling of ankles.  Blood pressure is essentially at goal currently.  BP Readings from Last 3 Encounters:  02/08/20 120/86    01/03/20 128/89  10/05/19 122/64    6. Vitamin D deficiency She is currently taking no vitamin D supplement.    7. History of anemia Amanda [name] is not a vegetarian.  She does not have a history of weight loss surgery.  She has a history of uterine fibroids.  She is taking an OTC iron supplement daily.  CBC Latest Ref Rng & Units 01/13/2018 01/12/2018 01/09/2018  WBC 4.0 - 10.5 K/uL 3.6(L) 4.5 4.4  Hemoglobin 12.0 - 15.0 g/dL 12.3 12.4 12.0  Hematocrit 36 - 46 % 40.3 40.6 39.3  Platelets 150 - 400 K/uL 265 260 270   Lab Results  Component Value Date   IRON 31 09/05/2017   TIBC 458 (H) 09/05/2017   FERRITIN 2 (L) 09/05/2017    8. Other depression with emotional eating Amanda [name] is struggling with emotional eating and using food for comfort to the extent that it is negatively impacting her health. She has been working on behavior modification techniques to help reduce her emotional eating and has been unsuccessful. She shows no sign of suicidal or homicidal ideations.  PHQ-9 is 10.   9. At risk for diabetes mellitus Amanda [name] is at higher than average risk for developing diabetes due to her strong family history of diabetes, elevated BMI, and very sedentary lifestyle.   Assessment/Plan:   1. Other fatigue Amanda [name] does feel that her weight is causing her energy to be lower than it should be. Fatigue may be related to obesity, depression or many other causes. Labs will be ordered, and in the meanwhile, Amanda [name] will focus on self care including making healthy food choices, increasing physical activity and focusing on stress reduction.  Check labs, continue prudent nutritional plan, weight loss.  EKG today. - EKG 12-Lead - Lipid panel - Vitamin B12 - Folate - T3, free - T4, free - TSH  2. Shortness of breath on exertion Amanda [name] does feel that she gets out of breath more easily that she used to when she exercises. Amanda [name]'s shortness of breath appears to be obesity related and exercise induced. She has  agreed to work on weight loss and gradually increase exercise to treat her exercise induced shortness of breath. Will continue to monitor closely.  Check labs, continue prudent nutritional plan, weight loss.  IC today.  3. Congestive heart failure, unspecified HF chronicity, unspecified heart failure type ([place]) Disease process and medications reviewed with an emphasis on salt restriction, regular exercise, and regular weight monitoring.  - Comprehensive metabolic panel  4. Multiple sclerosis ([place]) Will check labs.  Treatment plan per specialist.  5. Essential hypertension Sumiko is working on healthy weight loss and exercise to improve blood pressure control. We will watch for signs of hypotension as she continues her lifestyle modifications.  Check labs, continue prudent nutritional plan, weight loss.  She will monitor her blood pressure at home and keep a log.    6. Vitamin D deficiency Low Vitamin D level contributes to fatigue and are associated with obesity, breast, and colon cancer.  Will check labs today. - VITAMIN D 25 Hydroxy (Vit-D Deficiency, Fractures)  7. History of iron deficiency anemia Orders and follow up as documented in patient record.  Check labs including anemia panel, continue prudent nutritional plan, weight loss.  Counseling . Iron is essential for our bodies to make red blood cells.  Reasons that someone may be deficient include: an iron-deficient diet (more likely in those following vegan or vegetarian diets), women with heavy menses, patients with GI disorders or poor absorption, patients that have had bariatric surgery, frequent blood donors, patients with cancer, and patients with heart disease.   [place] Noble foods include dark leafy greens, red and white meats, eggs, seafood, and beans.   . Certain foods and drinks prevent your body from absorbing iron properly. Avoid eating these foods in the same meal as iron-rich foods or with iron supplements. These foods  include: coffee, black tea, and red wine; milk, dairy products, and foods that are high in calcium; beans and soybeans; whole grains.  . Constipation can be a side effect of iron supplementation. Increased water and fiber intake are helpful. Water goal: > 2 liters/day. Fiber goal: > 25 grams/day. - CBC with Differential/Platelet  8. Other depression with emotional eating Patient was referred to Dr. Mallie Mussel, our Bariatric Psychologist, for evaluation due to her elevated PHQ-9 score of10 and significant struggles with emotional eating.  9. At risk for diabetes mellitus Annjeanette was given approximately 15 minutes of diabetes education and counseling today. We discussed intensive lifestyle modifications today with an emphasis on weight loss as well as increasing exercise and decreasing simple carbohydrates in her diet. We also reviewed medication options with an emphasis on risk versus benefit of those discussed.   Repetitive spaced learning was employed today to elicit superior memory formation and behavioral change. - Hemoglobin A1c - Insulin, random  10. Class 3 severe obesity with serious comorbidity and body mass index (BMI) of 50.0 to 59.9 in adult, unspecified obesity type ([place]) Deneane is currently in the action stage of change and her goal is to continue with weight loss efforts. I recommend Marly begin the structured treatment plan as follows:  She has agreed to the Category 1 Plan.  Exercise goals: As is.   Behavioral modification strategies: increasing water intake, keeping healthy foods in the home and planning for success.  She was informed of the importance of frequent follow-up visits to maximize her success with intensive lifestyle modifications for her multiple health conditions. She was informed we would discuss her lab results at her next visit unless there is a critical issue that needs to be addressed sooner. Samentha agreed to keep her next visit at the agreed upon time to discuss  these results.  Objective:   Blood pressure 120/86, pulse 88, temperature 98 F (36.7 C), temperature source Oral, height 5\' 7"  (1.702 m), weight (!) 341 lb (154.7 kg), last menstrual period 02/03/2020, SpO2 99 %. Body mass index is 53.41 kg/m.  EKG: Normal sinus rhythm, rate 82 bpm.  Indirect Calorimeter completed today shows a VO2 of 202 and a REE of 1406.  Her calculated basal metabolic rate is 8841 thus her basal metabolic rate is worse than expected.  General: Cooperative, alert, well developed, in no acute distress. HEENT: Conjunctivae and lids unremarkable. Cardiovascular: Regular rhythm.  Lungs: Normal work of breathing. Neurologic: No focal deficits.   Lab Results  Component Value Date   CREATININE 0.69 01/03/2020   BUN 8 01/03/2020   NA 138 01/03/2020  K 3.7 01/03/2020   CL 108 01/03/2020   CO2 25 01/03/2020   Lab Results  Component Value Date   ALT 19 09/09/2017   AST 28 09/09/2017   ALKPHOS 37 (L) 09/09/2017   BILITOT 0.4 09/09/2017   Lab Results  Component Value Date   TSH 1.531 04/14/2018   Lab Results  Component Value Date   WBC 3.6 (L) 01/13/2018   HGB 12.3 01/13/2018   HCT 40.3 01/13/2018   MCV 82.4 01/13/2018   PLT 265 01/13/2018   Lab Results  Component Value Date   IRON 31 09/05/2017   TIBC 458 (H) 09/05/2017   FERRITIN 2 (L) 09/05/2017   Attestation Statements:   Reviewed by clinician on day of visit: allergies, medications, problem list, medical history, surgical history, family history, social history, and previous encounter notes.  I, Water quality scientist, CMA, am acting as Location manager for [employer], DO.  I have reviewed the above documentation for accuracy and completeness, and I agree with the above. Mellody Dance, DO

## 2020-02-09 LAB — COMPREHENSIVE METABOLIC PANEL
ALT: 12 IU/L (ref 0–32)
AST: 15 IU/L (ref 0–40)
Albumin/Globulin Ratio: 1.3 (ref 1.2–2.2)
Albumin: 3.7 g/dL — ABNORMAL LOW (ref 3.8–4.8)
Alkaline Phosphatase: 45 IU/L — ABNORMAL LOW (ref 48–121)
BUN/Creatinine Ratio: 13 (ref 9–23)
BUN: 9 mg/dL (ref 6–20)
Bilirubin Total: 0.3 mg/dL (ref 0.0–1.2)
CO2: 24 mmol/L (ref 20–29)
Calcium: 8.8 mg/dL (ref 8.7–10.2)
Chloride: 108 mmol/L — ABNORMAL HIGH (ref 96–106)
Creatinine, Ser: 0.69 mg/dL (ref 0.57–1.00)
GFR calc Af Amer: 129 mL/min/{1.73_m2} (ref >59)
GFR calc non Af Amer: 112 mL/min/{1.73_m2} (ref >59)
Globulin, Total: 2.9 g/dL (ref 1.5–4.5)
Glucose: 81 mg/dL (ref 65–99)
Potassium: 4.4 mmol/L (ref 3.5–5.2)
Sodium: 142 mmol/L (ref 134–144)
Total Protein: 6.6 g/dL (ref 6.0–8.5)

## 2020-02-09 LAB — CBC WITH DIFFERENTIAL/PLATELET
Basophils Absolute: 0 10*3/uL (ref 0.0–0.2)
Basos: 1 %
EOS (ABSOLUTE): 0.4 10*3/uL (ref 0.0–0.4)
Eos: 10 %
Hemoglobin: 10.4 g/dL — ABNORMAL LOW (ref 11.1–15.9)
Immature Grans (Abs): 0 10*3/uL (ref 0.0–0.1)
Immature Granulocytes: 0 %
Lymphocytes Absolute: 0.8 10*3/uL (ref 0.7–3.1)
Lymphs: 22 %
MCH: 26.3 pg — ABNORMAL LOW (ref 26.6–33.0)
MCHC: 31 g/dL — ABNORMAL LOW (ref 31.5–35.7)
MCV: 85 fL (ref 79–97)
Monocytes Absolute: 0.3 10*3/uL (ref 0.1–0.9)
Monocytes: 8 %
Neutrophils Absolute: 2.2 10*3/uL (ref 1.4–7.0)
Neutrophils: 59 %
Platelets: 203 10*3/uL (ref 150–450)
RBC: 3.96 x10E6/uL (ref 3.77–5.28)
RDW: 15.6 % — ABNORMAL HIGH (ref 11.7–15.4)
WBC: 3.8 10*3/uL (ref 3.4–10.8)

## 2020-02-09 LAB — VITAMIN D 25 HYDROXY (VIT D DEFICIENCY, FRACTURES): Vit D, 25-Hydroxy: 17.3 ng/mL — ABNORMAL LOW (ref 30.0–100.0)

## 2020-02-09 LAB — LIPID PANEL
Chol/HDL Ratio: 3.7 ratio (ref 0.0–4.4)
Cholesterol, Total: 147 mg/dL (ref 100–199)
HDL: 40 mg/dL (ref >39)
LDL Chol Calc (NIH): 94 mg/dL (ref 0–99)
Triglycerides: 62 mg/dL (ref 0–149)
VLDL Cholesterol Cal: 13 mg/dL (ref 5–40)

## 2020-02-09 LAB — ANEMIA PANEL
Ferritin: 31 ng/mL (ref 15–150)
Folate, Hemolysate: 318 ng/mL
Folate, RBC: 946 ng/mL (ref >498)
Hematocrit: 33.6 % — ABNORMAL LOW (ref 34.0–46.6)
Iron Saturation: 10 % — ABNORMAL LOW (ref 15–55)
Iron: 31 ug/dL (ref 27–159)
Retic Ct Pct: 1.6 % (ref 0.6–2.6)
Total Iron Binding Capacity: 297 ug/dL (ref 250–450)
UIBC: 266 ug/dL (ref 131–425)
Vitamin B-12: 356 pg/mL (ref 232–1245)

## 2020-02-09 LAB — T4, FREE: Free T4: 1.14 ng/dL (ref 0.82–1.77)

## 2020-02-09 LAB — INSULIN, RANDOM: INSULIN: 17 u[IU]/mL (ref 2.6–24.9)

## 2020-02-09 LAB — HEMOGLOBIN A1C
Est. average glucose Bld gHb Est-mCnc: 114 mg/dL
Hgb A1c MFr Bld: 5.6 % (ref 4.8–5.6)

## 2020-02-09 LAB — TSH: TSH: 3.25 u[IU]/mL (ref 0.450–4.500)

## 2020-02-09 LAB — FOLATE: Folate: 6.9 ng/mL (ref >3.0)

## 2020-02-09 LAB — T3, FREE: T3, Free: 2.4 pg/mL (ref 2.0–4.4)

## 2020-02-10 DIAGNOSIS — Z3009 Encounter for other general counseling and advice on contraception: Secondary | ICD-10-CM | POA: Diagnosis not present

## 2020-02-10 DIAGNOSIS — D251 Intramural leiomyoma of uterus: Secondary | ICD-10-CM | POA: Diagnosis not present

## 2020-02-10 DIAGNOSIS — N939 Abnormal uterine and vaginal bleeding, unspecified: Secondary | ICD-10-CM | POA: Diagnosis not present

## 2020-02-10 DIAGNOSIS — Z01411 Encounter for gynecological examination (general) (routine) with abnormal findings: Secondary | ICD-10-CM | POA: Diagnosis not present

## 2020-02-15 DIAGNOSIS — Z0000 Encounter for general adult medical examination without abnormal findings: Secondary | ICD-10-CM | POA: Diagnosis not present

## 2020-02-15 DIAGNOSIS — D509 Iron deficiency anemia, unspecified: Secondary | ICD-10-CM | POA: Diagnosis not present

## 2020-02-15 DIAGNOSIS — E559 Vitamin D deficiency, unspecified: Secondary | ICD-10-CM | POA: Diagnosis not present

## 2020-02-15 DIAGNOSIS — Z13220 Encounter for screening for lipoid disorders: Secondary | ICD-10-CM | POA: Diagnosis not present

## 2020-02-15 DIAGNOSIS — G35 Multiple sclerosis: Secondary | ICD-10-CM | POA: Diagnosis not present

## 2020-02-16 ENCOUNTER — Other Ambulatory Visit: Payer: Self-pay | Admitting: Family Medicine

## 2020-02-16 DIAGNOSIS — R221 Localized swelling, mass and lump, neck: Secondary | ICD-10-CM

## 2020-02-17 ENCOUNTER — Other Ambulatory Visit: Payer: Self-pay

## 2020-02-17 ENCOUNTER — Telehealth (INDEPENDENT_AMBULATORY_CARE_PROVIDER_SITE_OTHER): Payer: BC Managed Care – PPO | Admitting: Psychology

## 2020-02-22 ENCOUNTER — Encounter (INDEPENDENT_AMBULATORY_CARE_PROVIDER_SITE_OTHER): Payer: Self-pay | Admitting: Family Medicine

## 2020-02-22 ENCOUNTER — Other Ambulatory Visit: Payer: Self-pay

## 2020-02-22 ENCOUNTER — Ambulatory Visit (INDEPENDENT_AMBULATORY_CARE_PROVIDER_SITE_OTHER): Payer: BC Managed Care – PPO | Admitting: Family Medicine

## 2020-02-22 VITALS — BP 130/86 | HR 84 | Temp 98.0°F | Ht 67.0 in | Wt 341.0 lb

## 2020-02-22 DIAGNOSIS — E8881 Metabolic syndrome: Secondary | ICD-10-CM

## 2020-02-22 DIAGNOSIS — D508 Other iron deficiency anemias: Secondary | ICD-10-CM

## 2020-02-22 DIAGNOSIS — Z9189 Other specified personal risk factors, not elsewhere classified: Secondary | ICD-10-CM

## 2020-02-22 DIAGNOSIS — I10 Essential (primary) hypertension: Secondary | ICD-10-CM | POA: Diagnosis not present

## 2020-02-22 DIAGNOSIS — E559 Vitamin D deficiency, unspecified: Secondary | ICD-10-CM | POA: Diagnosis not present

## 2020-02-22 DIAGNOSIS — Z6841 Body Mass Index (BMI) 40.0 and over, adult: Secondary | ICD-10-CM
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
## 2020-02-23 MED ORDER — FERROUS SULFATE 325 (65 FE) MG PO TABS: 325.0000 mg | ORAL_TABLET | Freq: Two times a day (BID) | ORAL | 0 refills | Status: DC

## 2020-02-23 NOTE — Progress Notes (Signed)
Chief Complaint:   OBESITY Amanda [name] is here to discuss her progress with her obesity treatment plan along with follow-up of her obesity related diagnoses. Amanda [name] is on the Category 1 Plan and states she is following her eating plan approximately 2% of the time. Amanda [name] states she is exercising for 0 minutes 0 times per week.  Today's visit was #: 2 Starting weight: 341 lbs Starting date: 02/08/2020 Today's weight: 341 lbs Today's date: 02/22/2020 Total lbs lost to date: 0 Total lbs lost since last in-office visit: 0  Interim History: Amanda [name] says that she was recently told by [facility] that she may need a hysterectomy.  She wants to get pregnant with her husband.  She did not eat for 3 days straight.  She bout nutritional foods.  Subjective:   1. Essential hypertension Discussed labs with patient today.  Review: taking medications as instructed, no medication side effects noted, no chest pain on exertion, no dyspnea on exertion, no swelling of ankles.  Blood pressure is at goal essentially.  No concerns or new symptoms.  Tolerating medications well.   BP Readings from Last 3 Encounters:  02/22/20 130/86  02/08/20 120/86  01/03/20 128/89   2. Vitamin D deficiency New.  Discussed labs with patient today.  Amanda [name]'s Vitamin D level was 17.3 on 02/08/2020. She is currently taking prescription vitamin D 50,000 IU each week. She denies nausea, vomiting or muscle weakness.  She was recently started on this by her PCP, whom she saw 3-4 days after seeing me.  3. Other iron deficiency anemia Discussed labs with patient today.  Amanda [name] is not a vegetarian.  She does not have a history of weight loss surgery.  She has been on a daily iron supplement for a couple of years.  CBC shows Hgb of 10.4, Fe sat low at 10.  She endorses fatigue and tiredness.  CBC Latest Ref Rng & Units 02/08/2020 01/13/2018 01/12/2018  WBC 3.4 - 10.8 x10E3/uL 3.8 3.6(L) 4.5  Hemoglobin 11.1 - 15.9 g/dL 10.4(L) 12.3 12.4  Hematocrit  34.0 - 46.6 % 33.6(L) 40.3 40.6  Platelets 150 - 450 x10E3/uL 203 265 260   Lab Results  Component Value Date   IRON 31 02/08/2020   TIBC 297 02/08/2020   FERRITIN 31 02/08/2020   Lab Results  Component Value Date   VITAMINB12 356 02/08/2020   4. Insulin resistance New.  Discussed labs with patient today.  Amanda [name] has a diagnosis of insulin resistance based on her elevated fasting insulin level >5. She continues to work on diet and exercise to decrease her risk of diabetes.    Lab Results  Component Value Date   INSULIN 17.0 02/08/2020   Lab Results  Component Value Date   HGBA1C 5.6 02/08/2020   5. At risk for diabetes mellitus Amanda [name] is at higher than average risk for developing diabetes due to her obesity.   Assessment/Plan:   1. Essential hypertension Amanda [name] is working on healthy weight loss and exercise to improve blood pressure control. We will watch for signs of hypotension as she continues her lifestyle modifications.  Medication management per Cardiology, Dr. Aundra Dubin.  Continue prudent nutritional plan, weight loss.  She knows her blood pressure will decrease with weight loss and will closely monitor.  2. Vitamin D deficiency Low Vitamin D level contributes to fatigue and are associated with obesity, breast, and colon cancer. She agrees to continue to take prescription Vitamin D @50 ,000 IU every week and will follow-up for routine  testing of Vitamin D, at least 2-3 times per year to avoid over-replacement.  Will recheck level in 2-3 months along with calcium level.  3. Other iron deficiency anemia Start ferrous sulfate 325 mg twice daily.  Recheck anemia panel and CBC in 2-3 months.  She was cautioned on the risk of constipation.  May take with vitamin C tablets.  Orders and follow up as documented in patient record.  Counseling . Iron is essential for our bodies to make red blood cells.  Reasons that someone may be deficient include: an iron-deficient diet (more likely in  those following vegan or vegetarian diets), women with heavy menses, patients with GI disorders or poor absorption, patients that have had bariatric surgery, frequent blood donors, patients with cancer, and patients with heart disease.   Amanda [name] foods include dark leafy greens, red and white meats, eggs, seafood, and beans.   . Certain foods and drinks prevent your body from absorbing iron properly. Avoid eating these foods in the same meal as iron-rich foods or with iron supplements. These foods include: coffee, black tea, and red wine; milk, dairy products, and foods that are high in calcium; beans and soybeans; whole grains.  . Constipation can be a side effect of iron supplementation. Increased water and fiber intake are helpful. Water goal: > 2 liters/day. Fiber goal: > 25 grams/day. - ferrous sulfate 325 (65 FE) MG tablet; Take 1 tablet (325 mg total) by mouth 2 (two) times daily with a meal.  Dispense: 60 tablet; Refill: 0  4. Insulin resistance Amanda [name] will continue to work on weight loss, exercise, and decreasing simple carbohydrates to help decrease the risk of diabetes. Amanda [name] agreed to follow-up with [place] as directed to closely monitor her progress.  Continue prudent nutritional plan, weight loss.  Handouts provided and education performed.  5. At risk for diabetes mellitus Amanda [name] was given approximately 15 minutes of diabetes education and counseling today. We discussed intensive lifestyle modifications today with an emphasis on weight loss as well as increasing exercise and decreasing simple carbohydrates in her diet. We also reviewed medication options with an emphasis on risk versus benefit of those discussed.   Repetitive spaced learning was employed today to elicit superior memory formation and behavioral change.  6. Class 3 severe obesity with serious comorbidity and body mass index (BMI) of 50.0 to 59.9 in adult, unspecified obesity type (Amanda [name]) Amanda [name] is currently in the action stage  of change. As such, her goal is to continue with weight loss efforts. She has agreed to the Category 1 Plan.   Exercise goals: As is.  Behavioral modification strategies: increasing lean protein intake, decreasing simple carbohydrates, increasing water intake, no skipping meals and planning for success.  Follow-up with GYN doctor regarding options other than hysterectomy.  Amanda [name] has agreed to follow-up with our clinic in 2 weeks. She was informed of the importance of frequent follow-up visits to maximize her success with intensive lifestyle modifications for her multiple health conditions.   Objective:   Blood pressure 130/86, pulse 84, temperature 98 F (36.7 C), temperature source Oral, height 5\' 7"  (1.702 m), weight (!) 341 lb (154.7 kg), last menstrual period 02/03/2020, SpO2 97 %. Body mass index is 53.41 kg/m.  General: Cooperative, alert, well developed, in no acute distress. HEENT: Conjunctivae and lids unremarkable. Cardiovascular: Regular rhythm.  Lungs: Normal work of breathing. Neurologic: No focal deficits.   Lab Results  Component Value Date   CREATININE 0.69 02/08/2020   BUN 9 02/08/2020  NA 142 02/08/2020   K 4.4 02/08/2020   CL 108 (H) 02/08/2020   CO2 24 02/08/2020   Lab Results  Component Value Date   ALT 12 02/08/2020   AST 15 02/08/2020   ALKPHOS 45 (L) 02/08/2020   BILITOT 0.3 02/08/2020   Lab Results  Component Value Date   HGBA1C 5.6 02/08/2020   Lab Results  Component Value Date   INSULIN 17.0 02/08/2020   Lab Results  Component Value Date   TSH 3.250 02/08/2020   Lab Results  Component Value Date   CHOL 147 02/08/2020   HDL 40 02/08/2020   LDLCALC 94 02/08/2020   TRIG 62 02/08/2020   CHOLHDL 3.7 02/08/2020   Lab Results  Component Value Date   WBC 3.8 02/08/2020   HGB 10.4 (L) 02/08/2020   HCT 33.6 (L) 02/08/2020   MCV 85 02/08/2020   PLT 203 02/08/2020   Lab Results  Component Value Date   IRON 31 02/08/2020   TIBC 297  02/08/2020   FERRITIN 31 02/08/2020   Attestation Statements:   Reviewed by clinician on day of visit: allergies, medications, problem list, medical history, surgical history, family history, social history, and previous encounter notes.  I, Water quality scientist, CMA, am acting as Location manager for [employer], DO.  I have reviewed the above documentation for accuracy and completeness, and I agree with the above. Mellody Dance, DO

## 2020-02-24 ENCOUNTER — Ambulatory Visit
Admission: RE | Admit: 2020-02-24 | Discharge: 2020-02-24 | Disposition: A | Discharge status: Home / Self Care | Payer: BC Managed Care – PPO | Source: Ambulatory Visit | Attending: Family Medicine | Admitting: Family Medicine

## 2020-02-24 DIAGNOSIS — R221 Localized swelling, mass and lump, neck: Secondary | ICD-10-CM | POA: Diagnosis not present

## 2020-03-07 ENCOUNTER — Telehealth (INDEPENDENT_AMBULATORY_CARE_PROVIDER_SITE_OTHER): Payer: BC Managed Care – PPO | Admitting: Psychology

## 2020-03-08 ENCOUNTER — Other Ambulatory Visit (HOSPITAL_COMMUNITY): Payer: Self-pay | Admitting: Cardiology

## 2020-03-13 ENCOUNTER — Ambulatory Visit (INDEPENDENT_AMBULATORY_CARE_PROVIDER_SITE_OTHER): Payer: BC Managed Care – PPO | Admitting: Family Medicine

## 2020-04-24 ENCOUNTER — Other Ambulatory Visit (HOSPITAL_COMMUNITY): Payer: Self-pay | Admitting: Cardiology

## 2020-04-24 MED ORDER — CARVEDILOL 25 MG PO TABS: ORAL_TABLET | ORAL | 3 refills | Status: DC

## 2020-04-24 MED ORDER — ENTRESTO 97-103 MG PO TABS: 1.0000 | ORAL_TABLET | Freq: Two times a day (BID) | ORAL | 3 refills | Status: DC

## 2020-05-05 ENCOUNTER — Encounter (HOSPITAL_COMMUNITY): Payer: BC Managed Care – PPO | Admitting: Cardiology

## 2020-05-27 ENCOUNTER — Other Ambulatory Visit: Payer: Self-pay

## 2020-05-27 ENCOUNTER — Ambulatory Visit: Payer: Self-pay | Attending: Internal Medicine

## 2020-05-27 DIAGNOSIS — Z23 Encounter for immunization: Secondary | ICD-10-CM

## 2020-05-27 NOTE — Progress Notes (Signed)
   Covid-19 Vaccination Clinic  Name:  Amanda [name]    MRN: [id number] DOB: September 29, 1980  05/27/2020  Amanda [name] was observed post Covid-19 immunization for 15 minutes without incident. She was provided with Vaccine Information Sheet and instruction to access the V-Safe system.   Amanda [name] was instructed to call 911 with any severe reactions post vaccine: Marland Kitchen Difficulty breathing  . Swelling of face and throat  . A fast heartbeat  . A bad rash all over body  . Dizziness and weakness

## 2020-06-30 ENCOUNTER — Other Ambulatory Visit (HOSPITAL_COMMUNITY): Payer: Self-pay | Admitting: Cardiology

## 2020-07-16 ENCOUNTER — Other Ambulatory Visit (HOSPITAL_COMMUNITY): Payer: Self-pay | Admitting: Cardiology

## 2020-07-19 ENCOUNTER — Encounter (HOSPITAL_COMMUNITY): Payer: Self-pay | Admitting: Cardiology

## 2020-07-25 DIAGNOSIS — D259 Leiomyoma of uterus, unspecified: Secondary | ICD-10-CM | POA: Diagnosis not present

## 2020-07-25 DIAGNOSIS — E559 Vitamin D deficiency, unspecified: Secondary | ICD-10-CM | POA: Diagnosis not present

## 2020-07-25 DIAGNOSIS — N920 Excessive and frequent menstruation with regular cycle: Secondary | ICD-10-CM | POA: Diagnosis not present

## 2020-07-27 ENCOUNTER — Other Ambulatory Visit: Payer: Self-pay | Admitting: Obstetrics and Gynecology

## 2020-07-27 DIAGNOSIS — N920 Excessive and frequent menstruation with regular cycle: Secondary | ICD-10-CM

## 2020-07-27 DIAGNOSIS — D219 Benign neoplasm of connective and other soft tissue, unspecified: Secondary | ICD-10-CM

## 2020-07-31 ENCOUNTER — Ambulatory Visit
Admission: RE | Admit: 2020-07-31 | Discharge: 2020-07-31 | Disposition: A | Discharge status: Home / Self Care | Payer: BLUE CROSS/BLUE SHIELD | Source: Ambulatory Visit | Attending: Obstetrics and Gynecology | Admitting: Obstetrics and Gynecology

## 2020-07-31 DIAGNOSIS — D219 Benign neoplasm of connective and other soft tissue, unspecified: Secondary | ICD-10-CM

## 2020-07-31 DIAGNOSIS — N920 Excessive and frequent menstruation with regular cycle: Secondary | ICD-10-CM

## 2020-07-31 DIAGNOSIS — N854 Malposition of uterus: Secondary | ICD-10-CM | POA: Diagnosis not present

## 2020-07-31 DIAGNOSIS — N939 Abnormal uterine and vaginal bleeding, unspecified: Secondary | ICD-10-CM | POA: Diagnosis not present

## 2020-07-31 DIAGNOSIS — D251 Intramural leiomyoma of uterus: Secondary | ICD-10-CM | POA: Diagnosis not present

## 2020-07-31 DIAGNOSIS — D252 Subserosal leiomyoma of uterus: Secondary | ICD-10-CM | POA: Diagnosis not present

## 2020-09-07 ENCOUNTER — Other Ambulatory Visit (HOSPITAL_COMMUNITY): Payer: Self-pay | Admitting: Cardiology

## 2020-09-19 ENCOUNTER — Ambulatory Visit (HOSPITAL_COMMUNITY)
Admission: RE | Admit: 2020-09-19 | Discharge: 2020-09-19 | Disposition: A | Discharge status: Home / Self Care | Payer: BC Managed Care – PPO | Source: Ambulatory Visit | Attending: Cardiology | Admitting: Cardiology

## 2020-09-19 ENCOUNTER — Other Ambulatory Visit: Payer: Self-pay

## 2020-09-19 ENCOUNTER — Encounter (HOSPITAL_COMMUNITY): Payer: Self-pay | Admitting: Cardiology

## 2020-09-19 VITALS — BP 130/80 | HR 88 | Wt 350.6 lb

## 2020-09-19 DIAGNOSIS — Z6841 Body Mass Index (BMI) 40.0 and over, adult: Secondary | ICD-10-CM | POA: Diagnosis not present

## 2020-09-19 DIAGNOSIS — Z7901 Long term (current) use of anticoagulants: Secondary | ICD-10-CM | POA: Insufficient documentation

## 2020-09-19 DIAGNOSIS — E669 Obesity, unspecified: Secondary | ICD-10-CM | POA: Insufficient documentation

## 2020-09-19 DIAGNOSIS — I5022 Chronic systolic (congestive) heart failure: Secondary | ICD-10-CM | POA: Insufficient documentation

## 2020-09-19 DIAGNOSIS — Z8249 Family history of ischemic heart disease and other diseases of the circulatory system: Secondary | ICD-10-CM | POA: Diagnosis not present

## 2020-09-19 DIAGNOSIS — I428 Other cardiomyopathies: Secondary | ICD-10-CM | POA: Insufficient documentation

## 2020-09-19 DIAGNOSIS — G35 Multiple sclerosis: Secondary | ICD-10-CM | POA: Insufficient documentation

## 2020-09-19 DIAGNOSIS — R0602 Shortness of breath: Secondary | ICD-10-CM | POA: Insufficient documentation

## 2020-09-19 DIAGNOSIS — I509 Heart failure, unspecified: Secondary | ICD-10-CM | POA: Diagnosis not present

## 2020-09-19 DIAGNOSIS — Z79899 Other long term (current) drug therapy: Secondary | ICD-10-CM | POA: Diagnosis not present

## 2020-09-19 LAB — CBC
HCT: 31.4 % — ABNORMAL LOW (ref 36.0–46.0)
Hemoglobin: 9.3 g/dL — ABNORMAL LOW (ref 12.0–15.0)
MCH: 24.5 pg — ABNORMAL LOW (ref 26.0–34.0)
MCHC: 29.6 g/dL — ABNORMAL LOW (ref 30.0–36.0)
MCV: 82.8 fL (ref 80.0–100.0)
Platelets: 245 10*3/uL (ref 150–400)
RBC: 3.79 MIL/uL — ABNORMAL LOW (ref 3.87–5.11)
RDW: 15.3 % (ref 11.5–15.5)
WBC: 4.9 10*3/uL (ref 4.0–10.5)
nRBC: 0 % (ref 0.0–0.2)

## 2020-09-19 LAB — BASIC METABOLIC PANEL
Anion gap: 7 (ref 5–15)
BUN: 10 mg/dL (ref 6–20)
CO2: 26 mmol/L (ref 22–32)
Calcium: 9 mg/dL (ref 8.9–10.3)
Chloride: 106 mmol/L (ref 98–111)
Creatinine, Ser: 0.65 mg/dL (ref 0.44–1.00)
GFR, Estimated: >60 mL/min (ref >60)
Glucose, Bld: 126 mg/dL — ABNORMAL HIGH (ref 70–99)
Potassium: 3.9 mmol/L (ref 3.5–5.1)
Sodium: 139 mmol/L (ref 135–145)

## 2020-09-19 MED ORDER — SPIRONOLACTONE 25 MG PO TABS: 25.0000 mg | ORAL_TABLET | Freq: Every day | ORAL | 3 refills | Status: DC

## 2020-09-19 MED ORDER — CARVEDILOL 12.5 MG PO TABS: 12.5000 mg | ORAL_TABLET | Freq: Two times a day (BID) | ORAL | 3 refills | Status: DC

## 2020-09-19 NOTE — Progress Notes (Signed)
ID:  Amanda [name], DOB 09-29-1980, MRN [id number]   Provider location: [place] [facility] Type of Visit: Established patient   PCP:  [name], PA  Cardiologist:  Dr. Aundra Dubin.    History of Present Illness: Amanda [name] is a 40 y.o. who has a history of multiple sclerosis, anemia due to uterine fibroids, and systolic HF (dx 01/2129).  Admitted to [facility] 5/31-01/13/18  with volume overload and found to have acute systolic heart failure. Echo showed EF 10-15%. HF team consulted. R/LHC completed, which showed no CAD and preserved CO. Cardiac MRI showed no LGE, EF 14%. She was diuresed with IV lasix and transitioned to lasix 40 mg daily. HF meds were optimized. DC weight: 299 lbs.  Echo in 11/18 showed EF 35% with diffuse hypokinesis.   She has been unable to tolerate even low dose Bidil.   Echo in 4/20 showed EF 40%, mild LVH, normal RV, normal IVC.   Echo in 5/21 showed EF 45%, normal RV.   She returns today for followup of CHF.  Weight is stable.  She has only been taking Coreg and Entresto once daily for several weeks because she has been getting lightheaded after taking her medications.  This started a couple of months ago.  No dizziness with cutting back on Coreg and Entresto.  She is short of breath with stairs, no dyspnea walking on flat ground.  She has been working part time at [employer], no problems doing this.  No orthopnea/PND.    Labs (6/19): K 4.3, creatinine 0.88 Labs (10/19): K 3.4, creatinine 0.8 Labs (11/19): K 4.1, creatinine 0.74 Labs (4/20): K 4.1, creatinine 0.78 Labs (7/20): K 4.3, creatinine 0.68 Labs (12/20): K 3.7, creatinine 0.74 Labs (2/21): K 4, creatinine 0.64 Labs (6/21): TSH normal, LDL 94, K 4.4, creatinine 0.69                  PMH:  1. Multiple sclerosis 2. Uterine fibroids 3. Anemia: Likely related to fibroids.  4. Chronic systolic CHF: Diagnosed in 5/19.  Nonischemic cardiomyopathy.  - Echo (6/19) with EF 10-15%.  -  RHC/LHC (6/19): mean RA 5, PA 30/13 mean 22, mean PCWP 9, CI 2.63.  No angiographic CAD.  - Cardiac MRI (6/19): EF 14% with severely dilated LV, mildly dilated RV with mildly decreased systolic function, no myocardial LGE.  - Echo (8/19): EF 30%, diffuse hypokinesis, mildly dilated RV with normal systolic function, PASP 40 mmHg.  - Echo (11/19): EF 35%, diffuse hypokinesis, mildly dilated RV with normal systolic function.  - Echo (4/20): EF 40% with mild LV dilation and mild LVH, normal RV, normal IVC.  - Echo (5/21): EF 45%, mild LVH, normal RV, normal IVC 5. Sleep study (9/19): No OSA.  6. BPPV  Past Surgical History:  Procedure Laterality Date  . CARDIAC CATHETERIZATION  01/12/2018     Current Outpatient Medications  Medication Sig Dispense Refill  . ferrous sulfate 325 (65 FE) MG tablet Take 1 tablet (325 mg total) by mouth 2 (two) times daily with a meal. 60 tablet 0  . gabapentin (NEURONTIN) 300 MG capsule Take 300 mg by mouth daily as needed.     Marland Kitchen ibuprofen (ADVIL,MOTRIN) 200 MG tablet Take 200 mg by mouth every 6 (six) hours as needed for headache.    . sacubitril-valsartan (ENTRESTO) 97-103 MG Take 1 tablet by mouth 2 (two) times daily. 60 tablet 3  . Teriflunomide (AUBAGIO) 14 MG TABS Take 14  mg by mouth daily.     . Vitamin D, Ergocalciferol, (DRISDOL) 1.25 MG (50000 UNIT) CAPS capsule Take 50,000 Units by mouth every 7 (seven) days.    . carvedilol (COREG) 12.5 MG tablet Take 1 tablet (12.5 mg total) by mouth 2 (two) times daily with a meal. 180 tablet 3  . spironolactone (ALDACTONE) 25 MG tablet Take 1 tablet (25 mg total) by mouth at bedtime. 90 tablet 3   No current facility-administered medications for this encounter.    Allergies:   Patient has no known allergies.   Social History:  The patient  reports that she has never smoked. She has never used smokeless tobacco. She reports that she does not drink alcohol and does not use drugs.   Family History:  The patient's  family history includes Diabetes in her father and mother; Fibroids in her sister; Heart disease in her mother; Hypertension in her mother; Obesity in her mother; Stroke in her mother.   ROS:  Please see the history of present illness.   All other systems are personally reviewed and negative.   Exam:   BP 130/80   Pulse 88   Wt (!) 159 kg (350 lb 9.6 oz)   SpO2 98%   BMI 54.91 kg/m  General: NAD Neck: No JVD, no thyromegaly or thyroid nodule.  Lungs: Clear to auscultation bilaterally with normal respiratory effort. CV: Nondisplaced PMI.  Heart regular S1/S2, no S3/S4, no murmur.  No peripheral edema.  No carotid bruit.  Normal pedal pulses.  Abdomen: Soft, nontender, no hepatosplenomegaly, no distention.  Skin: Intact without lesions or rashes.  Neurologic: Alert and oriented x 3.  Psych: Normal affect. Extremities: No clubbing or cyanosis.  HEENT: Normal.   Recent Labs: 02/08/2020: ALT 12; TSH 3.250 09/19/2020: BUN 10; Creatinine, Ser 0.65; Hemoglobin 9.3; Platelets 245; Potassium 3.9; Sodium 139  Personally reviewed   Wt Readings from Last 3 Encounters:  09/19/20 (!) 159 kg (350 lb 9.6 oz)  02/22/20 (!) 154.7 kg (341 lb)  02/08/20 (!) 154.7 kg (341 lb)      ASSESSMENT AND PLAN:  1. Chronic systolic CHF: Echo 6/72 with EF 10-15%, moderate MR. Nonischemic cardiomyopathy with no coronary disease on 6/19 cath. 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 Mother developed CHF in her 68s but no other family members have CHF that she knows of (has several healthy siblings). She does not drink ETOH or use drugs. No children, has not been pregnant so not peri-partum CMP. TSH normal.  RHC 01/12/18 with normal filling pressures and preserved CO. Cardiac MRI 01/12/18 did not show LGE, EF 14%.Repeat echo 11/19 showed EF up to 35%.  Echo in 4/20 showed EF 40%.  Echo in 5/21 showed EF up to 45%.  NYHA class II symptoms.  Weight is stable, she is not volume overloaded on exam. She has cut back Coreg  and Entresto on her own to once a day due to lightheadedness after taking her meds.  - Go back to taking Entresto 97/103 bid.  - Decrease Coreg to 12.5 mg but take twice a day.  - Continue spironolactone 25 daily, take a bedtime given orthostatic symptoms. BMET today.  - I will have her stop Lasix as she looks euvolemic and has had orthostatic symptoms.  - She stopped dapagliflozin ("felt funny") and does not want to retry it.  - She did not tolerate even low dose Bidil.   - EF is now out of ICD range.  - Echo at followup.  2. History of uterine fibroids with bleeding/anemia: At this point, I think that she would be at reasonable risk to undergo fibroid surgery if needed.  - CBC today.  3. Obesity: We again discussed weight loss with diet and exercise.  She tried [facility] but did not stick with it.   Followup in 3 months with echo.    Signed, Loralie Champagne, MD  09/19/2020  [place] [address] [facility] [place] [address] [phone] (office) [phone] (fax)

## 2020-09-19 NOTE — Patient Instructions (Addendum)
Labs done today. We will contact you only if your labs are abnormal.  DECREASE Carvedilol to 12.75m (1 tablet) by mouth daily.  START Entresto 97-103mg  (1 tablet) by mouth 2 times daily.  TAKE Spironolactone daily at bedtime   STOP Taking Lasix  No other medication changes were made. Please continue all current medications as prescribed.  Your physician recommends that you schedule a follow-up appointment in: 3 months with an echo prior to your exam  Your physician has requested that you have an echocardiogram. Echocardiography is a painless test that uses sound waves to create images of your heart. It provides your doctor with information about the size and shape of your heart and how well your heart's chambers and valves are working. This procedure takes approximately one hour. There are no restrictions for this procedure.   If you have any questions or concerns before your next appointment please send [place] a message through [place] or call our office at [phone].    TO LEAVE A MESSAGE FOR THE NURSE SELECT OPTION 2, PLEASE LEAVE A MESSAGE INCLUDING: . YOUR NAME . DATE OF BIRTH . CALL BACK NUMBER . REASON FOR CALL**this is important as we prioritize the call backs  YOU WILL RECEIVE A CALL BACK THE SAME DAY AS LONG AS YOU CALL BEFORE 4:00 PM   Do the following things EVERYDAY: 1) Weigh yourself in the morning before breakfast. Write it down and keep it in a log. 2) Take your medicines as prescribed 3) Eat low salt foods--Limit salt (sodium) to 2000 mg per day.  4) Stay as active as you can everyday 5) Limit all fluids for the day to less than 2 liters   At the [facility], you and your health needs are our priority. As part of our continuing mission to provide you with exceptional heart care, we have created designated Provider Care Teams. These Care Teams include your primary Cardiologist (physician) and Advanced Practice Providers (APPs- Physician Assistants  and Nurse Practitioners) who all work together to provide you with the care you need, when you need it.   You may see any of the following providers on your designated Care Team at your next follow up: Marland Kitchen Dr Glori Bickers . Dr Loralie Champagne . Darrick Grinder, NP . Lyda Jester, PA . Audry Riles, PharmD   Please be sure to bring in all your medications bottles to every appointment.

## 2020-10-24 DIAGNOSIS — D219 Benign neoplasm of connective and other soft tissue, unspecified: Secondary | ICD-10-CM | POA: Diagnosis not present

## 2020-10-24 DIAGNOSIS — Z6841 Body Mass Index (BMI) 40.0 and over, adult: Secondary | ICD-10-CM | POA: Diagnosis not present

## 2020-10-24 DIAGNOSIS — N939 Abnormal uterine and vaginal bleeding, unspecified: Secondary | ICD-10-CM | POA: Diagnosis not present

## 2020-10-24 DIAGNOSIS — I509 Heart failure, unspecified: Secondary | ICD-10-CM | POA: Diagnosis not present

## 2020-12-11 DIAGNOSIS — G35 Multiple sclerosis: Secondary | ICD-10-CM | POA: Diagnosis not present

## 2020-12-11 DIAGNOSIS — I428 Other cardiomyopathies: Secondary | ICD-10-CM | POA: Diagnosis not present

## 2020-12-11 DIAGNOSIS — D219 Benign neoplasm of connective and other soft tissue, unspecified: Secondary | ICD-10-CM | POA: Diagnosis not present

## 2020-12-18 ENCOUNTER — Ambulatory Visit (HOSPITAL_BASED_OUTPATIENT_CLINIC_OR_DEPARTMENT_OTHER)
Admission: RE | Admit: 2020-12-18 | Discharge: 2020-12-18 | Disposition: A | Discharge status: Home / Self Care | Payer: BC Managed Care – PPO | Source: Ambulatory Visit | Attending: Cardiology | Admitting: Cardiology

## 2020-12-18 ENCOUNTER — Other Ambulatory Visit (HOSPITAL_COMMUNITY): Payer: Self-pay

## 2020-12-18 ENCOUNTER — Ambulatory Visit (HOSPITAL_COMMUNITY)
Admission: RE | Admit: 2020-12-18 | Discharge: 2020-12-18 | Disposition: A | Discharge status: Home / Self Care | Payer: BC Managed Care – PPO | Source: Ambulatory Visit | Attending: Cardiology | Admitting: Cardiology

## 2020-12-18 ENCOUNTER — Other Ambulatory Visit: Payer: Self-pay

## 2020-12-18 ENCOUNTER — Encounter (HOSPITAL_COMMUNITY): Payer: Self-pay | Admitting: Cardiology

## 2020-12-18 VITALS — BP 150/110 | HR 74 | Wt 341.6 lb

## 2020-12-18 DIAGNOSIS — I110 Hypertensive heart disease with heart failure: Secondary | ICD-10-CM | POA: Insufficient documentation

## 2020-12-18 DIAGNOSIS — D638 Anemia in other chronic diseases classified elsewhere: Secondary | ICD-10-CM | POA: Diagnosis not present

## 2020-12-18 DIAGNOSIS — Z713 Dietary counseling and surveillance: Secondary | ICD-10-CM | POA: Insufficient documentation

## 2020-12-18 DIAGNOSIS — Z6841 Body Mass Index (BMI) 40.0 and over, adult: Secondary | ICD-10-CM | POA: Diagnosis not present

## 2020-12-18 DIAGNOSIS — D259 Leiomyoma of uterus, unspecified: Secondary | ICD-10-CM | POA: Insufficient documentation

## 2020-12-18 DIAGNOSIS — Z79899 Other long term (current) drug therapy: Secondary | ICD-10-CM | POA: Insufficient documentation

## 2020-12-18 DIAGNOSIS — I509 Heart failure, unspecified: Secondary | ICD-10-CM

## 2020-12-18 DIAGNOSIS — Z7182 Exercise counseling: Secondary | ICD-10-CM | POA: Insufficient documentation

## 2020-12-18 DIAGNOSIS — E669 Obesity, unspecified: Secondary | ICD-10-CM | POA: Insufficient documentation

## 2020-12-18 DIAGNOSIS — Z8249 Family history of ischemic heart disease and other diseases of the circulatory system: Secondary | ICD-10-CM | POA: Diagnosis not present

## 2020-12-18 DIAGNOSIS — G35 Multiple sclerosis: Secondary | ICD-10-CM | POA: Insufficient documentation

## 2020-12-18 DIAGNOSIS — I5022 Chronic systolic (congestive) heart failure: Secondary | ICD-10-CM

## 2020-12-18 DIAGNOSIS — I340 Nonrheumatic mitral (valve) insufficiency: Secondary | ICD-10-CM | POA: Diagnosis not present

## 2020-12-18 DIAGNOSIS — R079 Chest pain, unspecified: Secondary | ICD-10-CM | POA: Insufficient documentation

## 2020-12-18 LAB — ECHOCARDIOGRAM COMPLETE
Area-P 1/2: 2.83 cm2
Calc EF: 43.8 %
S' Lateral: 4.1 cm
Single Plane A2C EF: 42.1 %
Single Plane A4C EF: 44 %

## 2020-12-18 LAB — BASIC METABOLIC PANEL
Anion gap: 5 (ref 5–15)
BUN: 10 mg/dL (ref 6–20)
CO2: 26 mmol/L (ref 22–32)
Calcium: 8.7 mg/dL — ABNORMAL LOW (ref 8.9–10.3)
Chloride: 109 mmol/L (ref 98–111)
Creatinine, Ser: 0.7 mg/dL (ref 0.44–1.00)
GFR, Estimated: >60 mL/min (ref >60)
Glucose, Bld: 89 mg/dL (ref 70–99)
Potassium: 4.2 mmol/L (ref 3.5–5.1)
Sodium: 140 mmol/L (ref 135–145)

## 2020-12-18 NOTE — Patient Instructions (Signed)
START Farxiga 10 mg, one tab daily  Labs today We will only contact you if something comes back abnormal or we need to make some changes. Otherwise no news is good news!  Labs needed in 10 days  Your physician recommends that you schedule a follow-up appointment in: 4 months  in the Advanced Practitioners (PA/NP) Clinic    Do the following things EVERYDAY: 1) Weigh yourself in the morning before breakfast. Write it down and keep it in a log. 2) Take your medicines as prescribed 3) Eat low salt foods--Limit salt (sodium) to 2000 mg per day.  4) Stay as active as you can everyday 5) Limit all fluids for the day to less than 2 liters  At the [facility], you and your health needs are our priority. As part of our continuing mission to provide you with exceptional heart care, we have created designated Provider Care Teams. These Care Teams include your primary Cardiologist (physician) and Advanced Practice Providers (APPs- Physician Assistants and Nurse Practitioners) who all work together to provide you with the care you need, when you need it.   You may see any of the following providers on your designated Care Team at your next follow up: Marland Kitchen Dr Glori Bickers . Dr Loralie Champagne . Dr Vickki Muff . Darrick Grinder, NP . Lyda Jester, [place] . Audry Riles, PharmD   Please be sure to bring in all your medications bottles to every appointment.   If you have any questions or concerns before your next appointment please send [place] a message through [place] or call our office at [phone].    TO LEAVE A MESSAGE FOR THE NURSE SELECT OPTION 2, PLEASE LEAVE A MESSAGE INCLUDING: . YOUR NAME . DATE OF BIRTH . CALL BACK NUMBER . REASON FOR CALL**this is important as we prioritize the call backs  YOU WILL RECEIVE A CALL BACK THE SAME DAY AS LONG AS YOU CALL BEFORE 4:00 PM

## 2020-12-18 NOTE — Progress Notes (Signed)
ID:  Amanda [name], DOB February 28, 1981, MRN [id number]   Provider location: [facility] [facility] Type of Visit: Established patient   PCP:  [name], PA  Cardiologist:  Dr. Aundra Dubin.    History of Present Illness: Amanda [name] is a 40 y.o. who has a history of multiple sclerosis, anemia due to uterine fibroids, and systolic HF (dx 0/0867).  Admitted to [facility] 5/31-01/13/18  with volume overload and found to have acute systolic heart failure. Echo showed EF 10-15%. HF team consulted. R/LHC completed, which showed no CAD and preserved CO. Cardiac MRI showed no LGE, EF 14%. She was diuresed with IV lasix and transitioned to lasix 40 mg daily. HF meds were optimized. DC weight: 299 lbs.  Echo in 11/18 showed EF 35% with diffuse hypokinesis.   She has been unable to tolerate even low dose Bidil.   Echo in 4/20 showed EF 40%, mild LVH, normal RV, normal IVC.   Echo in 5/21 showed EF 45%, normal RV.   Echo was done today and reviewed, EF 40%, diffuse hypokinesis, normal RV, mild MR.   She returns today for followup of CHF.  Weight is down about 9 lbs.  BP is elevated today but she has not taken any of her morning meds yet because she did not eat breakfast.  SBP in 110s range after her meds generally.  No significant exertional dyspnea.  No orthopnea/PND. No chest pain.  Overall has been feeling good. No further orthostatic symptoms.    ECG (personally reviewed): NSR, LVH  Labs (6/19): K 4.3, creatinine 0.88 Labs (10/19): K 3.4, creatinine 0.8 Labs (11/19): K 4.1, creatinine 0.74 Labs (4/20): K 4.1, creatinine 0.78 Labs (7/20): K 4.3, creatinine 0.68 Labs (12/20): K 3.7, creatinine 0.74 Labs (2/21): K 4, creatinine 0.64 Labs (6/21): TSH normal, LDL 94, K 4.4, creatinine 0.69          Labs (2/22): K 3.9, creatinine 0.65, hgb 9.3         PMH:  1. Multiple sclerosis 2. Uterine fibroids 3. Anemia: Likely related to fibroids.  4. Chronic systolic CHF: Diagnosed in  5/19.  Nonischemic cardiomyopathy.  - Echo (6/19) with EF 10-15%.  - RHC/LHC (6/19): mean RA 5, PA 30/13 mean 22, mean PCWP 9, CI 2.63.  No angiographic CAD.  - Cardiac MRI (6/19): EF 14% with severely dilated LV, mildly dilated RV with mildly decreased systolic function, no myocardial LGE.  - Echo (8/19): EF 30%, diffuse hypokinesis, mildly dilated RV with normal systolic function, PASP 40 mmHg.  - Echo (11/19): EF 35%, diffuse hypokinesis, mildly dilated RV with normal systolic function.  - Echo (4/20): EF 40% with mild LV dilation and mild LVH, normal RV, normal IVC.  - Echo (5/21): EF 45%, mild LVH, normal RV, normal IVC - Echo (5/22): EF 40%, diffuse hypokinesis, normal RV, mild MR. 5. Sleep study (9/19): No OSA.  6. BPPV  Past Surgical History:  Procedure Laterality Date  . CARDIAC CATHETERIZATION  01/12/2018     Current Outpatient Medications  Medication Sig Dispense Refill  . acetaminophen (TYLENOL) 325 MG tablet 1 tablet.    . carvedilol (COREG) 12.5 MG tablet Take 1 tablet (12.5 mg total) by mouth 2 (two) times daily with a meal. 180 tablet 3  . ferrous sulfate 325 (65 FE) MG tablet Take 1 tablet (325 mg total) by mouth 2 (two) times daily with a meal. 60 tablet 0  . gabapentin (NEURONTIN) 300 MG capsule  Take 300 mg by mouth daily as needed.     Marland Kitchen ibuprofen (ADVIL,MOTRIN) 200 MG tablet Take 200 mg by mouth every 6 (six) hours as needed for headache.    . sacubitril-valsartan (ENTRESTO) 97-103 MG Take 1 tablet by mouth 2 (two) times daily. 60 tablet 3  . spironolactone (ALDACTONE) 25 MG tablet Take 1 tablet (25 mg total) by mouth at bedtime. 90 tablet 3  . Teriflunomide (AUBAGIO) 14 MG TABS Take 14 mg by mouth daily.      No current facility-administered medications for this encounter.    Allergies:   Patient has no known allergies.   Social History:  The patient  reports that she has never smoked. She has never used smokeless tobacco. She reports that she does not drink  alcohol and does not use drugs.   Family History:  The patient's family history includes Diabetes in her father and mother; Fibroids in her sister; Heart disease in her mother; Hypertension in her mother; Obesity in her mother; Stroke in her mother.   ROS:  Please see the history of present illness.   All other systems are personally reviewed and negative.   Exam:   BP (!) 150/110   Pulse 74   Wt (!) 154.9 kg (341 lb 9.6 oz)   SpO2 100%   BMI 53.50 kg/m  General: NAD, obese Neck: No JVD, no thyromegaly or thyroid nodule.  Lungs: Clear to auscultation bilaterally with normal respiratory effort. CV: Nondisplaced PMI.  Heart regular S1/S2, no S3/S4, no murmur.  No peripheral edema.  No carotid bruit.  Normal pedal pulses.  Abdomen: Soft, nontender, no hepatosplenomegaly, no distention.  Skin: Intact without lesions or rashes.  Neurologic: Alert and oriented x 3.  Psych: Normal affect. Extremities: No clubbing or cyanosis.  HEENT: Normal.   Recent Labs: 02/08/2020: ALT 12; TSH 3.250 09/19/2020: Hemoglobin 9.3; Platelets 245 12/18/2020: BUN 10; Creatinine, Ser 0.70; Potassium 4.2; Sodium 140  Personally reviewed   Wt Readings from Last 3 Encounters:  12/18/20 (!) 154.9 kg (341 lb 9.6 oz)  09/19/20 (!) 159 kg (350 lb 9.6 oz)  02/22/20 (!) 154.7 kg (341 lb)      ASSESSMENT AND PLAN:  1. Chronic systolic CHF: Echo 9/93 with EF 10-15%, moderate MR. Nonischemic cardiomyopathy with no coronary disease on 6/19 cath. 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 Mother developed CHF in her 65s but no other family members have CHF that she knows of (has several healthy siblings). She does not drink ETOH or use drugs. No children, has not been pregnant so not peri-partum CMP. TSH normal.  RHC 01/12/18 with normal filling pressures and preserved CO. Cardiac MRI 01/12/18 did not show LGE, EF 14%.Repeat echo 11/19 showed EF up to 35%.  Echo in 4/20 showed EF 40%.  Echo in 5/21 showed EF up to 45%.  Echo  today with EF 40%.   NYHA class II symptoms.  Weight is down 9 lbs, she is not volume overloaded on exam.  No longer getting lightheaded with her meds.  - Continue Entresto 97/103 bid.  - Continue Coreg 12.5 mg twice a day.  - Continue spironolactone 25 daily, take a bedtime to limit orthostatic symptoms.  - Start Farxiga 10 mg daily, BMET today and in 10 days.  - She did not tolerate even low dose Bidil.   - EF is now out of ICD range.  2. Obesity: We again discussed weight loss with diet and exercise.  She tried the [facility] but did not stick with it.   Followup in 4 months with APP  Signed, Loralie Champagne, MD  12/18/2020  [place] [address] [facility] and [place] [address] [phone] (office) [phone] (fax)

## 2020-12-18 NOTE — Progress Notes (Signed)
  Echocardiogram 2D Echocardiogram has been performed.  Amanda [name] 12/18/2020, 9:59 AM

## 2020-12-28 ENCOUNTER — Telehealth (HOSPITAL_COMMUNITY): Payer: Self-pay | Admitting: Cardiology

## 2020-12-28 ENCOUNTER — Other Ambulatory Visit (HOSPITAL_COMMUNITY): Payer: BC Managed Care – PPO

## 2021-01-17 ENCOUNTER — Other Ambulatory Visit (HOSPITAL_COMMUNITY): Payer: Self-pay | Admitting: Cardiology

## 2021-03-02 ENCOUNTER — Other Ambulatory Visit (HOSPITAL_COMMUNITY): Payer: Self-pay

## 2021-03-02 ENCOUNTER — Encounter (HOSPITAL_COMMUNITY): Payer: Self-pay

## 2021-03-02 ENCOUNTER — Ambulatory Visit (HOSPITAL_COMMUNITY)
Admission: RE | Admit: 2021-03-02 | Discharge: 2021-03-02 | Disposition: A | Discharge status: Home / Self Care | Payer: BC Managed Care – PPO | Source: Ambulatory Visit | Attending: Family Medicine | Admitting: Family Medicine

## 2021-03-02 ENCOUNTER — Other Ambulatory Visit: Payer: Self-pay

## 2021-03-02 VITALS — BP 138/88 | HR 79 | Wt 340.0 lb

## 2021-03-02 DIAGNOSIS — I5022 Chronic systolic (congestive) heart failure: Secondary | ICD-10-CM

## 2021-03-02 DIAGNOSIS — Z8249 Family history of ischemic heart disease and other diseases of the circulatory system: Secondary | ICD-10-CM | POA: Diagnosis not present

## 2021-03-02 DIAGNOSIS — R0602 Shortness of breath: Secondary | ICD-10-CM | POA: Diagnosis not present

## 2021-03-02 DIAGNOSIS — I10 Essential (primary) hypertension: Secondary | ICD-10-CM | POA: Diagnosis not present

## 2021-03-02 DIAGNOSIS — E669 Obesity, unspecified: Secondary | ICD-10-CM | POA: Insufficient documentation

## 2021-03-02 DIAGNOSIS — I110 Hypertensive heart disease with heart failure: Secondary | ICD-10-CM | POA: Insufficient documentation

## 2021-03-02 DIAGNOSIS — R519 Headache, unspecified: Secondary | ICD-10-CM | POA: Diagnosis not present

## 2021-03-02 DIAGNOSIS — R079 Chest pain, unspecified: Secondary | ICD-10-CM | POA: Diagnosis not present

## 2021-03-02 DIAGNOSIS — Z79899 Other long term (current) drug therapy: Secondary | ICD-10-CM | POA: Insufficient documentation

## 2021-03-02 DIAGNOSIS — Z6841 Body Mass Index (BMI) 40.0 and over, adult: Secondary | ICD-10-CM | POA: Insufficient documentation

## 2021-03-02 DIAGNOSIS — I428 Other cardiomyopathies: Secondary | ICD-10-CM | POA: Diagnosis not present

## 2021-03-02 LAB — BASIC METABOLIC PANEL
Anion gap: 5 (ref 5–15)
BUN: 7 mg/dL (ref 6–20)
CO2: 27 mmol/L (ref 22–32)
Calcium: 9.3 mg/dL (ref 8.9–10.3)
Chloride: 105 mmol/L (ref 98–111)
Creatinine, Ser: 0.69 mg/dL (ref 0.44–1.00)
GFR, Estimated: >60 mL/min (ref >60)
Glucose, Bld: 80 mg/dL (ref 70–99)
Potassium: 4.5 mmol/L (ref 3.5–5.1)
Sodium: 137 mmol/L (ref 135–145)

## 2021-03-02 MED ORDER — ENTRESTO 97-103 MG PO TABS: 1.0000 | ORAL_TABLET | Freq: Two times a day (BID) | ORAL | 3 refills | Status: DC

## 2021-03-02 NOTE — Progress Notes (Signed)
ADVANCED HEART FAILURE CLINIC  ID:  Amanda [name], DOB [birth year], MRN [id number]   Provider location: [place] [facility] Type of Visit: Established patient   PCP:  [name], PA  HF Cardiologist:  Dr. Aundra Dubin.    History of Present Illness: Amanda [name] is a 40 y.o. who has a history of multiple sclerosis, anemia due to uterine fibroids, and systolic HF (dx [id number]).   Admitted to [facility] 5/31-01/13/18  with volume overload and found to have acute systolic heart failure. Echo showed EF 10-15%. HF team consulted. R/LHC completed, which showed no CAD and preserved CO. Cardiac MRI showed no LGE, EF 14%. She was diuresed with IV lasix and transitioned to lasix 40 mg daily. HF meds were optimized. DC weight: 299 lbs.   Echo in 11/18 showed EF 35% with diffuse hypokinesis.    She has been unable to tolerate even low dose Bidil.   Echo in 4/20 showed EF 40%, mild LVH, normal RV, normal IVC.   Echo in 5/21 showed EF 45%, normal RV.   Echo 5/22 and reviewed, EF 40%, diffuse hypokinesis, normal RV, mild MR.   She returned 5/22 for followup of CHF.  Weight was down about 9 lbs.  BP is elevated today but she has not taken any of her morning meds yet because she did not eat breakfast.  SBP in 110s range after her meds generally.  Stable NYHA II symptoms, not volume overloaded. Wilder Glade was started.  Today she returns for HF follow up. For the past 2 months, she has been having issues with elevated BP ~160s-170s. Intermittent chest ache, episodes do not last long and resolve with rest. Having more headaches, not much relief from acetaminophen. 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 Denies increasing dizziness, edema, or PND/Orthopnea. Appetite ok. No fever or chills. Weight stable. She has been out of Entresto for a month ($100 co-pay).  ECG (personally reviewed): NSR, LVH.  Labs (6/19): K 4.3, creatinine 0.88 Labs (10/19): K 3.4, creatinine 0.8 Labs  (11/19): K 4.1, creatinine 0.74 Labs (4/20): K 4.1, creatinine 0.78 Labs (7/20): K 4.3, creatinine 0.68 Labs (12/20): K 3.7, creatinine 0.74 Labs (2/21): K 4, creatinine 0.64 Labs (6/21): TSH normal, LDL 94, K 4.4, creatinine 0.69          Labs (2/22): K 3.9, creatinine 0.65, hgb 9.3        Labs (5/22): K 4.2, creatinine 0.70   PMH:  1. Multiple sclerosis 2. Uterine fibroids 3. Anemia: Likely related to fibroids.  4. Chronic systolic CHF: Diagnosed in 5/19.  Nonischemic cardiomyopathy.  - Echo (6/19) with EF 10-15%.  - RHC/LHC (6/19): mean RA 5, PA 30/13 mean 22, mean PCWP 9, CI 2.63.  No angiographic CAD.  - Cardiac MRI (6/19): EF 14% with severely dilated LV, mildly dilated RV with mildly decreased systolic function, no myocardial LGE.  - Echo (8/19): EF 30%, diffuse hypokinesis, mildly dilated RV with normal systolic function, PASP 40 mmHg.  - Echo (11/19): EF 35%, diffuse hypokinesis, mildly dilated RV with normal systolic function.  - Echo (4/20): EF 40% with mild LV dilation and mild LVH, normal RV, normal IVC.  - Echo (5/21): EF 45%, mild LVH, normal RV, normal IVC - Echo (5/22): EF 40%, diffuse hypokinesis, normal RV, mild MR. 5. Sleep study (9/19): No OSA.  6. BPPV  Past Surgical History:  Procedure Laterality Date   CARDIAC CATHETERIZATION  01/12/2018  Current Outpatient Medications  Medication Sig Dispense Refill   acetaminophen (TYLENOL) 325 MG tablet 325 mg. Patient takes as needed.     carvedilol (COREG) 12.5 MG tablet Take 1 tablet (12.5 mg total) by mouth 2 (two) times daily with a meal. 180 tablet 3   ferrous sulfate 325 (65 FE) MG tablet Take 1 tablet (325 mg total) by mouth 2 (two) times daily with a meal. 60 tablet 0   gabapentin (NEURONTIN) 300 MG capsule Take 300 mg by mouth daily as needed.      ibuprofen (ADVIL,MOTRIN) 200 MG tablet Take 200 mg by mouth every 6 (six) hours as needed for headache.     spironolactone (ALDACTONE) 25 MG tablet Take 1 tablet  (25 mg total) by mouth at bedtime. 90 tablet 3   Teriflunomide (AUBAGIO) 14 MG TABS Take 14 mg by mouth daily.      No current facility-administered medications for this encounter.    Allergies:   Patient has no known allergies.   Social History:  The patient  reports that she has never smoked. She has never used smokeless tobacco. She reports that she does not drink alcohol and does not use drugs.   Family History:  The patient's family history includes Diabetes in her father and mother; Fibroids in her sister; Heart disease in her mother; Hypertension in her mother; Obesity in her mother; Stroke in her mother.   ROS:  Please see the history of present illness.   All other systems are personally reviewed and negative.   Exam:   BP 138/88   Pulse 79   Wt (!) 154.2 kg (340 lb)   SpO2 98%   BMI 53.25 kg/m  General:  NAD. No resp difficulty HEENT: Normal Neck: Supple. JVP 7-8. Carotids 2+ bilat; no bruits. No lymphadenopathy or thryomegaly appreciated. Cor: PMI nondisplaced. Regular rate & rhythm. No rubs, gallops or murmurs. Lungs: Clear Abdomen: Obese, nontender, nondistended. No hepatosplenomegaly. No bruits or masses. Good bowel sounds. Extremities: No cyanosis, clubbing, rash, edema Neuro: Alert & oriented x 3, cranial nerves grossly intact. Moves all 4 extremities w/o difficulty. Affect pleasant.  Recent Labs: 09/19/2020: Hemoglobin 9.3; Platelets 245 12/18/2020: BUN 10; Creatinine, Ser 0.70; Potassium 4.2; Sodium 140  Personally reviewed   Wt Readings from Last 3 Encounters:  03/02/21 (!) 154.2 kg (340 lb)  12/18/20 (!) 154.9 kg (341 lb 9.6 oz)  09/19/20 (!) 159 kg (350 lb 9.6 oz)    ASSESSMENT AND PLAN:  1. Chronic systolic CHF: Echo 0000000 with EF 10-15%, moderate MR. Nonischemic cardiomyopathy with no coronary disease on 6/19 cath. 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 Mother developed CHF in her 35s but no other family members have CHF that she knows of (has several  healthy siblings).  She does not drink ETOH or use drugs.  No children, has not been pregnant so not peri-partum CMP.  TSH normal.  RHC 01/12/18 with normal filling pressures and preserved CO. Cardiac MRI 01/12/18 did not show LGE, EF 14%. Repeat echo 11/19 showed EF up to 35%.  Echo in 4/20 showed EF 40%.  Echo in 5/21 showed EF up to 45%.  Echo (5/22) with EF 40%.  NYHA class II symptoms. She appears mildly volume overloaded on exam, however weight is stable. - Restart Entresto 97/103 bid. BMET in 10 days. - Continue Coreg 12.5 mg bid.  - Continue spironolactone 25 daily, take a bedtime to limit orthostatic symptoms. - Continue Farxiga 10 mg daily. - She did not  tolerate even low dose Bidil.   - EF is now out of ICD range.  2. Obesity: We again discussed weight loss with diet and exercise.  She tried [facility] but did not stick with it.  Body mass index is 53.25 kg/m. 3. HTN: Elevated at home, 99991111 systolic. - Resume Entresto as above. New [place]-pay card given.  Followup in 2 months as scheduled.  Signed, Loralie Champagne, MD  03/02/2021  [facility] [address] [facility] [place] [address] [phone] (office) [phone] (fax)

## 2021-03-02 NOTE — Patient Instructions (Addendum)
EKG done today.   Labs done today. We will contact you only if your labs are abnormal.  RESTART Entresto 97-'103mg'$  (1 tablet) by mouth 2 times daily.   No other medication changes were made. Please continue all current medications as prescribed.  Your physician recommends that you schedule a follow-up appointment in: 10 days for a lab only appointment and Monday September 12th 2022 at [facility]  If you have any questions or concerns before your next appointment please send [place] a message through [place] or call our office at [phone].    TO LEAVE A MESSAGE FOR THE NURSE SELECT OPTION 2, PLEASE LEAVE A MESSAGE INCLUDING: YOUR NAME DATE OF BIRTH CALL BACK NUMBER REASON FOR CALL**this is important as we prioritize the call backs  YOU WILL RECEIVE A CALL BACK THE SAME DAY AS LONG AS YOU CALL BEFORE 4:00 PM   Do the following things EVERYDAY: Weigh yourself in the morning before breakfast. Write it down and keep it in a log. Take your medicines as prescribed Eat low salt foods--Limit salt (sodium) to 2000 mg per day.  Stay as active as you can everyday Limit all fluids for the day to less than 2 liters   At the [facility], you and your health needs are our priority. As part of our continuing mission to provide you with exceptional heart care, we have created designated Provider Care Teams. These Care Teams include your primary Cardiologist (physician) and Advanced Practice Providers (APPs- Physician Assistants and Nurse Practitioners) who all work together to provide you with the care you need, when you need it.   You may see any of the following providers on your designated Care Team at your next follow up: Dr Glori Bickers Dr Haynes Kerns, NP Lyda Jester, [place] Audry Riles, PharmD   Please be sure to bring in all your medications bottles to every appointment.

## 2021-03-12 ENCOUNTER — Other Ambulatory Visit (HOSPITAL_COMMUNITY): Payer: BC Managed Care – PPO

## 2021-04-23 ENCOUNTER — Encounter (HOSPITAL_COMMUNITY): Payer: BC Managed Care – PPO

## 2021-05-23 NOTE — Progress Notes (Signed)
ADVANCED HEART FAILURE CLINIC  ID:  [name], DOB 1981/08/07, MRN [id number]   Provider location: [place] [facility] Type of Visit: Established patient   PCP:  Amanda Huxley, PA  HF Cardiologist:  Dr. Aundra Dubin.    History of Present Illness: Amanda [name] is a 40 y.o. who has a history of multiple sclerosis, anemia due to uterine fibroids, and systolic HF (dx 10/5359).   Admitted to [facility] 5/31-01/13/18  with volume overload and found to have acute systolic heart failure. Echo showed EF 10-15%. HF team consulted. R/LHC completed, which showed no CAD and preserved CO. Cardiac MRI showed no LGE, EF 14%. She was diuresed with IV lasix and transitioned to lasix 40 mg daily. HF meds were optimized. DC weight: 299 lbs.   Echo in 11/18 showed EF 35% with diffuse hypokinesis.    She has been unable to tolerate even low dose Bidil.   Echo in 4/20 showed EF 40%, mild LVH, normal RV, normal IVC.   Echo in 5/21 showed EF 45%, normal RV.   Echo 5/22 and reviewed, EF 40%, diffuse hypokinesis, normal RV, mild MR.   Today she returns for HF follow up. She has been dizzy for the past 3 weeks, no falls. Denies increasing SOB, CP, abnormal bleeding, palpitations, edema, or PND/Orthopnea. Appetite ok. No fever or chills. Weight at home 335 pounds. Taking all medications. She stopped taking Farxiga due to fear of side effects. She works full time as Publishing copy at [employer]   ECG (personally reviewed): NSR, 71 bpm  Labs (6/19): K 4.3, creatinine 0.88 Labs (10/19): K 3.4, creatinine 0.8 Labs (11/19): K 4.1, creatinine 0.74 Labs (4/20): K 4.1, creatinine 0.78 Labs (7/20): K 4.3, creatinine 0.68 Labs (12/20): K 3.7, creatinine 0.74 Labs (2/21): K 4, creatinine 0.64 Labs (6/21): TSH normal, LDL 94, K 4.4, creatinine 0.69          Labs (2/22): K 3.9, creatinine 0.65, hgb 9.3        Labs (5/22): K 4.2, creatinine 0.70 Labs (7/22): K 4.5, creatinine 0.69   PMH:  1.  Multiple sclerosis 2. Uterine fibroids 3. Anemia: Likely related to fibroids.  4. Chronic systolic CHF: Diagnosed in 5/19.  Nonischemic cardiomyopathy.  - Echo (6/19) with EF 10-15%.  - RHC/LHC (6/19): mean RA 5, PA 30/13 mean 22, mean PCWP 9, CI 2.63.  No angiographic CAD.  - Cardiac MRI (6/19): EF 14% with severely dilated LV, mildly dilated RV with mildly decreased systolic function, no myocardial LGE.  - Echo (8/19): EF 30%, diffuse hypokinesis, mildly dilated RV with normal systolic function, PASP 40 mmHg.  - Echo (11/19): EF 35%, diffuse hypokinesis, mildly dilated RV with normal systolic function.  - Echo (4/20): EF 40% with mild LV dilation and mild LVH, normal RV, normal IVC.  - Echo (5/21): EF 45%, mild LVH, normal RV, normal IVC - Echo (5/22): EF 40%, diffuse hypokinesis, normal RV, mild MR. 5. Sleep study (9/19): No OSA.  6. BPPV  Past Surgical History:  Procedure Laterality Date   CARDIAC CATHETERIZATION  01/12/2018   Current Outpatient Medications  Medication Sig Dispense Refill   acetaminophen (TYLENOL) 325 MG tablet 325 mg. Patient takes as needed.     carvedilol (COREG) 12.5 MG tablet Take 1 tablet (12.5 mg total) by mouth 2 (two) times daily with a meal. 180 tablet 3   ferrous sulfate 325 (65 FE) MG tablet Take 1 tablet (325 mg total) by  mouth 2 (two) times daily with a meal. 60 tablet 0   gabapentin (NEURONTIN) 300 MG capsule Take 300 mg by mouth daily as needed.      ibuprofen (ADVIL,MOTRIN) 200 MG tablet Take 200 mg by mouth every 6 (six) hours as needed for headache.     sacubitril-valsartan (ENTRESTO) 97-103 MG Take 1 tablet by mouth 2 (two) times daily. 180 tablet 3   spironolactone (ALDACTONE) 25 MG tablet Take 1 tablet (25 mg total) by mouth at bedtime. 90 tablet 3   Teriflunomide (AUBAGIO) 14 MG TABS Take 14 mg by mouth daily.      No current facility-administered medications for this encounter.    Allergies:   Patient has no known allergies.   Social  History:  The patient  reports that she has never smoked. She has never used smokeless tobacco. She reports that she does not drink alcohol and does not use drugs.   Family History:  The patient's family history includes Diabetes in her father and mother; Fibroids in her sister; Heart disease in her mother; Hypertension in her mother; Obesity in her mother; Stroke in her mother.   ROS:  Please see the history of present illness.   All other systems are personally reviewed and negative.   Exam:   BP (!) 140/96   Pulse 94   Wt (!) 152.8 kg (336 lb 12.8 oz)   SpO2 97%   BMI 52.75 kg/m  General:  NAD. No resp difficulty HEENT: Normal Neck: Supple. No JVD. Carotids 2+ bilat; no bruits. No lymphadenopathy or thryomegaly appreciated. Cor: PMI nondisplaced. Regular rate & rhythm. No rubs, gallops or murmurs. Lungs: Clear Abdomen: Obese, nontender, nondistended. No hepatosplenomegaly. No bruits or masses. Good bowel sounds. Extremities: No cyanosis, clubbing, rash, edema Neuro: Alert & oriented x 3, cranial nerves grossly intact. Moves all 4 extremities w/o difficulty. Affect pleasant.  Recent Labs: 09/19/2020: Hemoglobin 9.3; Platelets 245 03/02/2021: BUN 7; Creatinine, Ser 0.69; Potassium 4.5; Sodium 137  Personally reviewed   Wt Readings from Last 3 Encounters:  05/24/21 (!) 152.8 kg (336 lb 12.8 oz)  03/02/21 (!) 154.2 kg (340 lb)  12/18/20 (!) 154.9 kg (341 lb 9.6 oz)    ASSESSMENT AND PLAN:  1. Chronic systolic CHF: Echo 1/57 with EF 10-15%, moderate MR. Nonischemic cardiomyopathy with no coronary disease on 6/19 cath. 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 Mother developed CHF in her 47s but no other family members have CHF that she knows of (has several healthy siblings).  She does not drink ETOH or use drugs.  No children, has not been pregnant so not peri-partum CMP.  TSH normal.  RHC 01/12/18 with normal filling pressures and preserved CO. Cardiac MRI 01/12/18 did not show LGE, EF  14%. Repeat echo 11/19 showed EF up to 35%.  Echo in 4/20 showed EF 40%.  Echo in 5/21 showed EF up to 45%.  Echo (5/22) with EF 40%.  NYHA class II symptoms. She is not volume overloaded on exam, weight is down 4 lbs. - Continue Entresto 97/103 bid. BMET today. - Continue Coreg 12.5 mg bid.  - Continue spironolactone 25 daily, take a bedtime to limit orthostatic symptoms. - She stopped her [place]. We discussed restarting, she will think about this. - She did not tolerate even low dose Bidil.   - EF is now out of ICD range.  2. Obesity: We again discussed weight loss with diet and exercise.  She tried the [facility] but  did not stick with it.  Body mass index is 52.75 kg/m. - Consider pharmacy visit for semaglutide. 3. HTN: Elevated today but she has not had her morning medications yet. - Check BP at home and bring log to next visit. 4. Dizziness: Obvious nystagmus when laid back in chair and moved head to the side to do orthostatics. Orthostatics negative today. - Will check BMET and CBC. - ? Related to her MS vs BPPV. - Can try low-dose meclizine, and I will give her BPPV exercises to try. - She needs to follow up with PCP.  Followup in 3-4 months with Dr. Aundra Dubin  Signed, Allena Katz, FNP-BC 05/24/2021  [facility] [address] Heart and [place] [address] [phone] (office) [phone] (fax)

## 2021-05-24 ENCOUNTER — Ambulatory Visit (HOSPITAL_COMMUNITY)
Admission: RE | Admit: 2021-05-24 | Discharge: 2021-05-24 | Disposition: A | Discharge status: Home / Self Care | Payer: BC Managed Care – PPO | Source: Ambulatory Visit | Attending: Family Medicine | Admitting: Family Medicine

## 2021-05-24 ENCOUNTER — Encounter (HOSPITAL_COMMUNITY): Payer: Self-pay

## 2021-05-24 ENCOUNTER — Other Ambulatory Visit: Payer: Self-pay

## 2021-05-24 VITALS — BP 174/100 | HR 79 | Wt 336.8 lb

## 2021-05-24 DIAGNOSIS — I428 Other cardiomyopathies: Secondary | ICD-10-CM | POA: Insufficient documentation

## 2021-05-24 DIAGNOSIS — Z8249 Family history of ischemic heart disease and other diseases of the circulatory system: Secondary | ICD-10-CM | POA: Diagnosis not present

## 2021-05-24 DIAGNOSIS — H5500 Unspecified nystagmus: Secondary | ICD-10-CM | POA: Diagnosis not present

## 2021-05-24 DIAGNOSIS — Z6841 Body Mass Index (BMI) 40.0 and over, adult: Secondary | ICD-10-CM | POA: Insufficient documentation

## 2021-05-24 DIAGNOSIS — Z79899 Other long term (current) drug therapy: Secondary | ICD-10-CM | POA: Diagnosis not present

## 2021-05-24 DIAGNOSIS — R42 Dizziness and giddiness: Secondary | ICD-10-CM

## 2021-05-24 DIAGNOSIS — I110 Hypertensive heart disease with heart failure: Secondary | ICD-10-CM | POA: Diagnosis not present

## 2021-05-24 DIAGNOSIS — I5022 Chronic systolic (congestive) heart failure: Secondary | ICD-10-CM

## 2021-05-24 DIAGNOSIS — E669 Obesity, unspecified: Secondary | ICD-10-CM | POA: Insufficient documentation

## 2021-05-24 DIAGNOSIS — I10 Essential (primary) hypertension: Secondary | ICD-10-CM

## 2021-05-24 LAB — BASIC METABOLIC PANEL
Anion gap: 5 (ref 5–15)
BUN: 8 mg/dL (ref 6–20)
CO2: 26 mmol/L (ref 22–32)
Calcium: 9.1 mg/dL (ref 8.9–10.3)
Chloride: 109 mmol/L (ref 98–111)
Creatinine, Ser: 0.65 mg/dL (ref 0.44–1.00)
GFR, Estimated: >60 mL/min (ref >60)
Glucose, Bld: 102 mg/dL — ABNORMAL HIGH (ref 70–99)
Potassium: 4.3 mmol/L (ref 3.5–5.1)
Sodium: 140 mmol/L (ref 135–145)

## 2021-05-24 LAB — CBC
HCT: 32.8 % — ABNORMAL LOW (ref 36.0–46.0)
Hemoglobin: 10.1 g/dL — ABNORMAL LOW (ref 12.0–15.0)
MCH: 25.8 pg — ABNORMAL LOW (ref 26.0–34.0)
MCHC: 30.8 g/dL (ref 30.0–36.0)
MCV: 83.9 fL (ref 80.0–100.0)
Platelets: 257 10*3/uL (ref 150–400)
RBC: 3.91 MIL/uL (ref 3.87–5.11)
RDW: 15 % (ref 11.5–15.5)
WBC: 4.7 10*3/uL (ref 4.0–10.5)
nRBC: 0 % (ref 0.0–0.2)

## 2021-05-24 MED ORDER — MECLIZINE HCL 25 MG PO TABS: ORAL_TABLET | ORAL | 2 refills | Status: DC

## 2021-05-24 NOTE — Addendum Note (Signed)
Encounter addended by: Stanford Scotland, RN on: 05/24/2021 1:05 PM  Actions taken: Vitals modified

## 2021-05-24 NOTE — Patient Instructions (Addendum)
EKG done today.  Orthostatic blood pressure checked today.   Labs done today. We will contact you only if your labs are abnormal.  START Meclizine 25mg  (1 tablet) by mouth every 6 to 12 hours as needed for dizziness. (You may try a 1/2 tablet by mouth to start.)  No other medication changes were made. Please continue all current medications as prescribed.  Your physician has requested that you regularly monitor and record your blood pressure readings at home. Please use the same machine at the same time of day to check your readings and record them to bring to your follow-up visit.  Your physician recommends that you schedule a follow-up appointment in: 4 months with Dr. Aundra Dubin.  If you have any questions or concerns before your next appointment please send [place] a message through [place] or call our office at [phone].    TO LEAVE A MESSAGE FOR THE NURSE SELECT OPTION 2, PLEASE LEAVE A MESSAGE INCLUDING: YOUR NAME DATE OF BIRTH CALL BACK NUMBER REASON FOR CALL**this is important as we prioritize the call backs  YOU WILL RECEIVE A CALL BACK THE SAME DAY AS LONG AS YOU CALL BEFORE 4:00 PM   Do the following things EVERYDAY: Weigh yourself in the morning before breakfast. Write it down and keep it in a log. Take your medicines as prescribed Eat low salt foods--Limit salt (sodium) to 2000 mg per day.  Stay as active as you can everyday Limit all fluids for the day to less than 2 liters   At the [facility], you and your health needs are our priority. As part of our continuing mission to provide you with exceptional heart care, we have created designated Provider Care Teams. These Care Teams include your primary Cardiologist (physician) and Advanced Practice Providers (APPs- Physician Assistants and Nurse Practitioners) who all work together to provide you with the care you need, when you need it.   You may see any of the following providers on your designated Care  Team at your next follow up: Dr Glori Bickers Dr Haynes Kerns, NP Lyda Jester, [place] Audry Riles, PharmD   Please be sure to bring in all your medications bottles to every appointment.

## 2021-07-11 DIAGNOSIS — Z20822 Contact with and (suspected) exposure to covid-19: Secondary | ICD-10-CM | POA: Diagnosis not present

## 2021-07-31 ENCOUNTER — Encounter (HOSPITAL_COMMUNITY): Payer: Self-pay

## 2021-07-31 ENCOUNTER — Other Ambulatory Visit: Payer: Self-pay

## 2021-07-31 ENCOUNTER — Ambulatory Visit (HOSPITAL_COMMUNITY)
Admission: EM | Admit: 2021-07-31 | Discharge: 2021-07-31 | Disposition: A | Discharge status: Home / Self Care | Payer: BC Managed Care – PPO | Attending: Internal Medicine | Admitting: Internal Medicine

## 2021-07-31 DIAGNOSIS — I509 Heart failure, unspecified: Secondary | ICD-10-CM | POA: Insufficient documentation

## 2021-07-31 DIAGNOSIS — Z79899 Other long term (current) drug therapy: Secondary | ICD-10-CM | POA: Diagnosis not present

## 2021-07-31 DIAGNOSIS — B349 Viral infection, unspecified: Secondary | ICD-10-CM | POA: Diagnosis not present

## 2021-07-31 DIAGNOSIS — Z20822 Contact with and (suspected) exposure to covid-19: Secondary | ICD-10-CM | POA: Diagnosis not present

## 2021-07-31 DIAGNOSIS — R052 Subacute cough: Secondary | ICD-10-CM

## 2021-07-31 DIAGNOSIS — G35 Multiple sclerosis: Secondary | ICD-10-CM | POA: Diagnosis not present

## 2021-07-31 LAB — POC INFLUENZA A AND B ANTIGEN (URGENT CARE ONLY)
INFLUENZA A ANTIGEN, POC: NEGATIVE
INFLUENZA B ANTIGEN, POC: NEGATIVE

## 2021-07-31 MED ORDER — BENZONATATE 100 MG PO CAPS: 100.0000 mg | ORAL_CAPSULE | Freq: Three times a day (TID) | ORAL | 0 refills | Status: DC | PRN

## 2021-07-31 MED ORDER — PROMETHAZINE-DM 6.25-15 MG/5ML PO SYRP: 5.0000 mL | ORAL_SOLUTION | Freq: Every evening | ORAL | 0 refills | Status: DC | PRN

## 2021-07-31 MED ORDER — PSEUDOEPHEDRINE HCL 60 MG PO TABS: 60.0000 mg | ORAL_TABLET | Freq: Three times a day (TID) | ORAL | 0 refills | Status: DC | PRN

## 2021-07-31 MED ORDER — CETIRIZINE HCL 10 MG PO TABS: 10.0000 mg | ORAL_TABLET | Freq: Every day | ORAL | 0 refills | Status: DC

## 2021-07-31 NOTE — Discharge Instructions (Signed)
We will notify you of your test results as they arrive and may take between 48-72 hours.  I encourage you to sign up for MyChart if you have not already done so as this can be the easiest way for [place] to communicate results to you online or through a phone app.  Generally, we only contact you if it is a positive test result.  In the meantime, if you develop worsening symptoms including fever, chest pain, shortness of breath despite our current treatment plan then please report to the emergency room as this may be a sign of worsening status from possible viral infection.  Otherwise, we will manage this as a viral syndrome. For sore throat or cough try using a honey-based tea. Use 3 teaspoons of honey with juice squeezed from half lemon. Place shaved pieces of ginger into 1/2-1 cup of water and warm over stove top. Then mix the ingredients and repeat every 4 hours as needed. Please take Tylenol 500mg -650mg  every 6 hours for aches and pains, fevers. Hydrate very well with at least 2 liters of water. Eat light meals such as soups to replenish electrolytes and soft fruits, veggies. Start an antihistamine like Zyrtec for postnasal drainage, sinus congestion.  You can take this together with pseudoephedrine (Sudafed) at a dose of 60 mg 2-3 times a day as needed for the same kind of congestion.  Use the cough medications as needed.

## 2021-07-31 NOTE — ED Triage Notes (Signed)
Patient presents to Urgent Care with complaints of fever, cough, and body aches since yesterday. Treating symptoms with theraflu. States spouse tested negative for covid/flu.

## 2021-07-31 NOTE — ED Provider Notes (Signed)
[place]   MRN: [id number] DOB: Feb 27, 1981  Subjective:   Amanda [name] is a 40 y.o. female presenting for 1 day history of acute onset fevers, coughing, body aches, sinus congestion, postnasal drainage.  Patient has had 1 sick contact with her spouse, they tested negative for COVID and flu.  She would like to make sure she does not have either.  She does have a history of multiple sclerosis, CHF, pneumonia.  She is not a smoker, no history of respiratory disorders.  No current facility-administered medications for this encounter.  Current Outpatient Medications:    benzonatate (TESSALON) 100 MG capsule, Take 1-2 capsules (100-200 mg total) by mouth 3 (three) times daily as needed for cough., Disp: 60 capsule, Rfl: 0   cetirizine (ZYRTEC ALLERGY) 10 MG tablet, Take 1 tablet (10 mg total) by mouth daily., Disp: 30 tablet, Rfl: 0   promethazine-dextromethorphan (PROMETHAZINE-DM) 6.25-15 MG/5ML syrup, Take 5 mLs by mouth at bedtime as needed for cough., Disp: 100 mL, Rfl: 0   pseudoephedrine (SUDAFED) 60 MG tablet, Take 1 tablet (60 mg total) by mouth every 8 (eight) hours as needed for congestion., Disp: 30 tablet, Rfl: 0   acetaminophen (TYLENOL) 325 MG tablet, 325 mg. Patient takes as needed., Disp: , Rfl:    carvedilol (COREG) 12.5 MG tablet, Take 1 tablet (12.5 mg total) by mouth 2 (two) times daily with a meal., Disp: 180 tablet, Rfl: 3   ferrous sulfate 325 (65 FE) MG tablet, Take 1 tablet (325 mg total) by mouth 2 (two) times daily with a meal., Disp: 60 tablet, Rfl: 0   gabapentin (NEURONTIN) 300 MG capsule, Take 300 mg by mouth daily as needed. , Disp: , Rfl:    ibuprofen (ADVIL,MOTRIN) 200 MG tablet, Take 200 mg by mouth every 6 (six) hours as needed for headache., Disp: , Rfl:    meclizine (ANTIVERT) 25 MG tablet, Take 1 tablet by mouth every 6 to 12 hours as needed for dizziness, Disp: 30 tablet, Rfl: 2   sacubitril-valsartan (ENTRESTO) 97-103 MG, Take 1 tablet  by mouth 2 (two) times daily., Disp: 180 tablet, Rfl: 3   spironolactone (ALDACTONE) 25 MG tablet, Take 1 tablet (25 mg total) by mouth at bedtime., Disp: 90 tablet, Rfl: 3   Teriflunomide (AUBAGIO) 14 MG TABS, Take 14 mg by mouth daily. , Disp: , Rfl:    No Known Allergies  Past Medical History:  Diagnosis Date   Anemia    BP (high blood pressure)    Chest pain    CHF (congestive heart failure) (HCC)    Edema, lower extremity    History of blood transfusion 08/2017   "related to low blood count"    Infertility, female    Joint pain    Multiple sclerosis ([place])    Pneumonia 12/2017   Shortness of breath    Swallowing difficulty    Vitamin D deficiency      Past Surgical History:  Procedure Laterality Date   CARDIAC CATHETERIZATION  01/12/2018    Family History  Problem Relation Age of Onset   Diabetes Mother    Hypertension Mother    Heart disease Mother    Stroke Mother    Obesity Mother    Fibroids Sister    Diabetes Father     Social History   Tobacco Use   Smoking status: Never   Smokeless tobacco: Never  Vaping Use   Vaping Use: Never used  Substance Use Topics  Alcohol use: Never   Drug use: Never    ROS   Objective:   Vitals: BP 138/80 (BP Location: Left Arm)    Pulse 100    Temp 98.8 F (37.1 C) (Oral)    Resp 16    LMP 07/11/2021 (Approximate)    SpO2 95%   Physical Exam Constitutional:      General: She is not in acute distress.    Appearance: Normal appearance. She is well-developed. She is obese. She is not ill-appearing, toxic-appearing or diaphoretic.  HENT:     Head: Normocephalic and atraumatic.     Right Ear: Tympanic membrane, ear canal and external ear normal. No drainage or tenderness. No middle ear effusion. Tympanic membrane is not erythematous.     Left Ear: Tympanic membrane, ear canal and external ear normal. No drainage or tenderness.  No middle ear effusion. Tympanic membrane is not erythematous.     Nose: Congestion and  rhinorrhea present.     Mouth/Throat:     Mouth: Mucous membranes are moist. No oral lesions.     Pharynx: No pharyngeal swelling, oropharyngeal exudate, posterior oropharyngeal erythema or uvula swelling.     Tonsils: No tonsillar exudate or tonsillar abscesses.  Eyes:     Extraocular Movements: Extraocular movements intact.     Right eye: Normal extraocular motion.     Left eye: Normal extraocular motion.     Conjunctiva/sclera: Conjunctivae normal.     Pupils: Pupils are equal, round, and reactive to light.  Cardiovascular:     Rate and Rhythm: Normal rate and regular rhythm.     Pulses: Normal pulses.     Heart sounds: Normal heart sounds. No murmur heard.   No friction rub. No gallop.  Pulmonary:     Effort: Pulmonary effort is normal. No respiratory distress.     Breath sounds: Normal breath sounds. No stridor. No wheezing, rhonchi or rales.  Musculoskeletal:     Cervical back: Normal range of motion and neck supple.  Lymphadenopathy:     Cervical: No cervical adenopathy.  Skin:    General: Skin is warm and dry.     Findings: No rash.  Neurological:     General: No focal deficit present.     Mental Status: She is alert and oriented to person, place, and time.  Psychiatric:        Mood and Affect: Mood normal.        Behavior: Behavior normal.        Thought Content: Thought content normal.    Results for orders placed or performed during the hospital encounter of 07/31/21 (from the past 24 hour(s))  POC Influenza A & B Ag (Urgent Care)     Status: None   Collection Time: 07/31/21  2:13 PM  Result Value Ref Range   INFLUENZA A ANTIGEN, POC NEGATIVE NEGATIVE   INFLUENZA B ANTIGEN, POC NEGATIVE NEGATIVE    Assessment and Plan :   PDMP not reviewed this encounter.  1. Acute viral syndrome   2. Subacute cough    Deferred imaging given clear cardiopulmonary exam, hemodynamically stable vital signs. Will manage for viral illness such as viral URI, viral syndrome,  viral rhinitis, COVID-19. Recommended supportive care. Offered scripts for symptomatic relief. Testing is pending. Counseled patient on potential for adverse effects with medications prescribed/recommended today, ER and return-to-clinic precautions discussed, patient verbalized understanding.     Jaynee Eagles, PA-C 07/31/21 1422

## 2021-08-01 LAB — SARS CORONAVIRUS 2 (TAT 6-24 HRS): SARS Coronavirus 2: POSITIVE — AB

## 2021-08-03 NOTE — Telephone Encounter (Signed)
Encounter completed.

## 2021-09-25 ENCOUNTER — Encounter (HOSPITAL_COMMUNITY): Payer: BC Managed Care – PPO | Admitting: Cardiology

## 2021-12-03 ENCOUNTER — Ambulatory Visit (HOSPITAL_COMMUNITY)
Admission: RE | Admit: 2021-12-03 | Discharge: 2021-12-03 | Disposition: A | Discharge status: Home / Self Care | Payer: Managed Care, Other (non HMO) | Source: Ambulatory Visit | Attending: Cardiology | Admitting: Cardiology

## 2021-12-03 VITALS — BP 150/82 | HR 105 | Wt 327.2 lb

## 2021-12-03 DIAGNOSIS — Z86018 Personal history of other benign neoplasm: Secondary | ICD-10-CM | POA: Diagnosis not present

## 2021-12-03 DIAGNOSIS — E669 Obesity, unspecified: Secondary | ICD-10-CM | POA: Insufficient documentation

## 2021-12-03 DIAGNOSIS — I5022 Chronic systolic (congestive) heart failure: Secondary | ICD-10-CM | POA: Diagnosis not present

## 2021-12-03 DIAGNOSIS — Z6841 Body Mass Index (BMI) 40.0 and over, adult: Secondary | ICD-10-CM | POA: Insufficient documentation

## 2021-12-03 DIAGNOSIS — Z79899 Other long term (current) drug therapy: Secondary | ICD-10-CM | POA: Insufficient documentation

## 2021-12-03 DIAGNOSIS — I428 Other cardiomyopathies: Secondary | ICD-10-CM | POA: Diagnosis not present

## 2021-12-03 DIAGNOSIS — Z8249 Family history of ischemic heart disease and other diseases of the circulatory system: Secondary | ICD-10-CM | POA: Insufficient documentation

## 2021-12-03 DIAGNOSIS — G35 Multiple sclerosis: Secondary | ICD-10-CM | POA: Diagnosis not present

## 2021-12-03 LAB — BASIC METABOLIC PANEL
Anion gap: 5 (ref 5–15)
BUN: 9 mg/dL (ref 6–20)
CO2: 26 mmol/L (ref 22–32)
Calcium: 8.9 mg/dL (ref 8.9–10.3)
Chloride: 109 mmol/L (ref 98–111)
Creatinine, Ser: 0.67 mg/dL (ref 0.44–1.00)
GFR, Estimated: >60 mL/min (ref >60)
Glucose, Bld: 104 mg/dL — ABNORMAL HIGH (ref 70–99)
Potassium: 3.8 mmol/L (ref 3.5–5.1)
Sodium: 140 mmol/L (ref 135–145)

## 2021-12-03 LAB — BRAIN NATRIURETIC PEPTIDE: B Natriuretic Peptide: 90.3 pg/mL (ref 0.0–100.0)

## 2021-12-03 MED ORDER — ENTRESTO 97-103 MG PO TABS: 1.0000 | ORAL_TABLET | Freq: Two times a day (BID) | ORAL | 3 refills | Status: DC

## 2021-12-03 MED ORDER — CARVEDILOL 12.5 MG PO TABS: 12.5000 mg | ORAL_TABLET | Freq: Two times a day (BID) | ORAL | 3 refills | Status: DC

## 2021-12-03 MED ORDER — SPIRONOLACTONE 25 MG PO TABS: 25.0000 mg | ORAL_TABLET | Freq: Every day | ORAL | 3 refills | Status: DC

## 2021-12-03 NOTE — Patient Instructions (Signed)
Medication Changes: ? ?RESTART medications: ? ? Carvedilol 12.5 mg Twice daily  ? Entresto 97/103 mg Twice daily  ? Spironolactone 25 mg Daily ? ?Lab Work: ? ?Labs done today, your results will be available in MyChart, we will contact you for abnormal readings. ? ?Your physician recommends that you return for lab work in: 1-2 weeks ? ?Testing/Procedures: ? ?Your physician has requested that you have an echocardiogram. Echocardiography is a painless test that uses sound waves to create images of your heart. It provides your doctor with information about the size and shape of your heart and how well your heart?s chambers and valves are working. This procedure takes approximately one hour. There are no restrictions for this procedure. IN 3 MONTHS ? ?Referrals: ? ?None ? ?Special Instructions // Education: ? ?Do the following things EVERYDAY: ?Weigh yourself in the morning before breakfast. Write it down and keep it in a log. ?Take your medicines as prescribed ?Eat low salt foods--Limit salt (sodium) to 2000 mg per day.  ?Stay as active as you can everyday ?Limit all fluids for the day to less than 2 liters ? ? ?Follow-Up in: 1 month and again in 3 months with an echocardiogram ? ?At the [facility], you and your health needs are our priority. We have a designated team specialized in the treatment of Heart Failure. This Care Team includes your primary Heart Failure Specialized Cardiologist (physician), Advanced Practice Providers (APPs- Physician Assistants and Nurse Practitioners), and Pharmacist who all work together to provide you with the care you need, when you need it.  ? ?You may see any of the following providers on your designated Care Team at your next follow up: ? ?Dr Glori Bickers ?Dr Loralie Champagne ?Darrick Grinder, NP ?Lyda Jester, PA ?Jessica Milford,NP ?Marlyce Huge, PA ?Audry Riles, PharmD ? ? ?Please be sure to bring in all your medications bottles to every appointment.  ? ?Need to  Contact us: ? ?If you have any questions or concerns before your next appointment please send [place] a message through [place] or call our office at [phone].   ? ?TO LEAVE A MESSAGE FOR THE NURSE SELECT OPTION 2, PLEASE LEAVE A MESSAGE INCLUDING: ?YOUR NAME ?DATE OF BIRTH ?CALL BACK NUMBER ?REASON FOR CALL**this is important as we prioritize the call backs ? ?YOU WILL RECEIVE A CALL BACK THE SAME DAY AS LONG AS YOU CALL BEFORE 4:00 PM ? ? ?

## 2021-12-04 NOTE — Progress Notes (Signed)
?  ? ?ID:  Amanda [name], DOB [birth year], MRN [id number]   ?Provider location: [place] [facility] ?Type of Visit: Established patient  ? ?PCP:  [name], PA  ?Cardiologist:  Dr. Aundra Dubin.  ?  ?History of Present Illness: ?Amanda [name] is a 41 y.o. who has a history of multiple sclerosis, anemia due to uterine fibroids, and systolic HF (dx 0/2409). ?  ?Admitted to [facility] 5/31-01/13/18  with volume overload and found to have acute systolic heart failure. Echo showed EF 10-15%. HF team consulted. R/LHC completed, which showed no CAD and preserved CO. Cardiac MRI showed no LGE, EF 14%. She was diuresed with IV lasix and transitioned to lasix 40 mg daily. HF meds were optimized. DC weight: 299 lbs. ?  ?Echo in 11/18 showed EF 35% with diffuse hypokinesis.  ?  ?She has been unable to tolerate even low dose Bidil.  ? ?Echo in 4/20 showed EF 40%, mild LVH, normal RV, normal IVC.  ? ?Echo in 5/21 showed EF 45%, normal RV.  ? ?Echo in 5/22 showed EF 40%, diffuse hypokinesis, normal RV, mild MR.  ? ?She returns today for followup of CHF.  She stopped [place] after reading about possible side effects. She has been out of her other heart meds for about a month.  Still gets vertigo symptoms if she turns her head to rapidly to one side.  No dyspnea walking on flat ground though she is short of breath carrying a heavy load.  No orthopnea/PND. No lightheadedness. No chest pain. Weight is down about 9 lbs.  ? ?ECG (personally reviewed): NSR, LVH ? ?Labs (6/19): K 4.3, creatinine 0.88 ?Labs (10/19): K 3.4, creatinine 0.8 ?Labs (11/19): K 4.1, creatinine 0.74 ?Labs (4/20): K 4.1, creatinine 0.78 ?Labs (7/20): K 4.3, creatinine 0.68 ?Labs (12/20): K 3.7, creatinine 0.74 ?Labs (2/21): K 4, creatinine 0.64 ?Labs (6/21): TSH normal, LDL 94, K 4.4, creatinine 0.69          ?Labs (2/22): K 3.9, creatinine 0.65, hgb 9.3      ?Labs (10/22): creatinine 0.65   ?  ?PMH:  ?1. Multiple sclerosis ?2. Uterine fibroids ?3. Anemia:  Likely related to fibroids.  ?4. Chronic systolic CHF: Diagnosed in 5/19.  Nonischemic cardiomyopathy.  ?- Echo (6/19) with EF 10-15%.  ?- RHC/LHC (6/19): mean RA 5, PA 30/13 mean 22, mean PCWP 9, CI 2.63.  No angiographic CAD.  ?- Cardiac MRI (6/19): EF 14% with severely dilated LV, mildly dilated RV with mildly decreased systolic function, no myocardial LGE.  ?- Echo (8/19): EF 30%, diffuse hypokinesis, mildly dilated RV with normal systolic function, PASP 40 mmHg.  ?- Echo (11/19): EF 35%, diffuse hypokinesis, mildly dilated RV with normal systolic function.  ?- Echo (4/20): EF 40% with mild LV dilation and mild LVH, normal RV, normal IVC.  ?- Echo (5/21): EF 45%, mild LVH, normal RV, normal IVC ?- Echo (5/22): EF 40%, diffuse hypokinesis, normal RV, mild MR. ?5. Sleep study (9/19): No OSA.  ?6. BPPV ?7. COVID-19 12/22 ? ?Past Surgical History:  ?Procedure Laterality Date  ? CARDIAC CATHETERIZATION  01/12/2018  ? ? ? ?Current Outpatient Medications  ?Medication Sig Dispense Refill  ? acetaminophen (TYLENOL) 325 MG tablet 325 mg. Patient takes as needed. (Patient not taking: Reported on 12/03/2021)    ? carvedilol (COREG) 12.5 MG tablet Take 1 tablet (12.5 mg total) by mouth 2 (two) times daily with a meal. 180 tablet 3  ? ferrous sulfate 325 (65  FE) MG tablet Take 1 tablet (325 mg total) by mouth 2 (two) times daily with a meal. (Patient not taking: Reported on 12/03/2021) 60 tablet 0  ? ibuprofen (ADVIL,MOTRIN) 200 MG tablet Take 200 mg by mouth every 6 (six) hours as needed for headache. (Patient not taking: Reported on 12/03/2021)    ? meclizine (ANTIVERT) 25 MG tablet Take 1 tablet by mouth every 6 to 12 hours as needed for dizziness (Patient not taking: Reported on 12/03/2021) 30 tablet 2  ? sacubitril-valsartan (ENTRESTO) 97-103 MG Take 1 tablet by mouth 2 (two) times daily. 180 tablet 3  ? spironolactone (ALDACTONE) 25 MG tablet Take 1 tablet (25 mg total) by mouth at bedtime. 90 tablet 3  ? ?No current  facility-administered medications for this encounter.  ? ? ?Allergies:   Patient has no known allergies.  ? ?Social History:  The patient  reports that she has never smoked. She has never used smokeless tobacco. She reports that she does not drink alcohol and does not use drugs.  ? ?Family History:  The patient's family history includes Diabetes in her father and mother; Fibroids in her sister; Heart disease in her mother; Hypertension in her mother; Obesity in her mother; Stroke in her mother.  ? ?ROS:  Please see the history of present illness.   All other systems are personally reviewed and negative.  ? ?Exam:   ?BP (!) 150/82   Pulse (!) 105   Wt (!) 148.4 kg (327 lb 4 oz)   SpO2 96%   BMI 51.25 kg/m?  ?General: NAD, obese ?Neck: No JVD, no thyromegaly or thyroid nodule.  ?Lungs: Clear to auscultation bilaterally with normal respiratory effort. ?CV: Nondisplaced PMI.  Heart regular S1/S2, no S3/S4, no murmur.  No peripheral edema.  No carotid bruit.  Normal pedal pulses.  ?Abdomen: Soft, nontender, no hepatosplenomegaly, no distention.  ?Skin: Intact without lesions or rashes.  ?Neurologic: Alert and oriented x 3.  ?Psych: Normal affect. ?Extremities: No clubbing or cyanosis.  ?HEENT: Normal.  ? ?Recent Labs: ?05/24/2021: Hemoglobin 10.1; Platelets 257 ?12/03/2021: B Natriuretic Peptide 90.3; BUN 9; Creatinine, Ser 0.67; Potassium 3.8; Sodium 140  ?Personally reviewed  ? ?Wt Readings from Last 3 Encounters:  ?12/03/21 (!) 148.4 kg (327 lb 4 oz)  ?05/24/21 (!) 152.8 kg (336 lb 12.8 oz)  ?03/02/21 (!) 154.2 kg (340 lb)  ?  ? ? ?ASSESSMENT AND PLAN: ? ?1. Chronic systolic CHF: Echo 7/40 with EF 10-15%, moderate MR. Nonischemic cardiomyopathy with no coronary disease on 6/19 cath. 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 Mother developed CHF in her 24s but no other family members have CHF that she knows of (has several healthy siblings).  She does not drink ETOH or use drugs.  No children, has not been pregnant  so not peri-partum CMP.  TSH normal.  RHC 01/12/18 with normal filling pressures and preserved CO. Cardiac MRI 01/12/18 did not show LGE, EF 14%. Repeat echo 11/19 showed EF up to 35%.  Echo in 4/20 showed EF 40%.  Echo in 5/21 showed EF up to 45%.  Echo in 5/22 showed EF 40%.   NYHA class II symptoms.  Weight is down 9 lbs, she is not volume overloaded on exam.  She is out of her meds.    ?- Restart Entresto 97/103 bid. BMET today and in 10 days.  ?- Restart Coreg 12.5 mg twice a day.  ?- Restart spironolactone 25 daily.  ?- She is scared of SGLT2 inhibitor use after  reading about side effects (does not want to take).   ?- She did not tolerate even low dose Bidil.   ?- EF is now out of ICD range.  ?2. Obesity: We again discussed weight loss with diet and exercise.   ?- She would probably be a good semaglutide candidate.  She will think about it and let me know if she wants to start it.  ? ?Followup in 1 month with APP ? ?Signed, ?Loralie Champagne, MD  ?12/04/2021 ? ?[facility] ?[place] ?[address] ?[facility] ?[place] [address] ?[phone] ([phone] ?[phone] (fax) ? ?

## 2021-12-05 ENCOUNTER — Other Ambulatory Visit (HOSPITAL_COMMUNITY): Payer: Self-pay | Admitting: *Deleted

## 2021-12-10 ENCOUNTER — Other Ambulatory Visit (HOSPITAL_COMMUNITY): Payer: Managed Care, Other (non HMO)

## 2021-12-10 ENCOUNTER — Other Ambulatory Visit (HOSPITAL_COMMUNITY): Payer: Self-pay | Admitting: *Deleted

## 2021-12-10 MED ORDER — SPIRONOLACTONE 25 MG PO TABS: 25.0000 mg | ORAL_TABLET | Freq: Every day | ORAL | 3 refills | Status: DC

## 2021-12-10 MED ORDER — ENTRESTO 97-103 MG PO TABS: 1.0000 | ORAL_TABLET | Freq: Two times a day (BID) | ORAL | 3 refills | Status: DC

## 2021-12-10 MED ORDER — CARVEDILOL 12.5 MG PO TABS: 12.5000 mg | ORAL_TABLET | Freq: Two times a day (BID) | ORAL | 3 refills | Status: DC

## 2021-12-17 ENCOUNTER — Telehealth (HOSPITAL_COMMUNITY): Payer: Self-pay | Admitting: *Deleted

## 2021-12-17 NOTE — Telephone Encounter (Signed)
Called pt to reschedule lab appt no answer/left vm for return call  ?

## 2022-01-01 ENCOUNTER — Encounter (HOSPITAL_COMMUNITY): Payer: Self-pay

## 2022-01-01 ENCOUNTER — Ambulatory Visit (HOSPITAL_COMMUNITY)
Admission: RE | Admit: 2022-01-01 | Discharge: 2022-01-01 | Disposition: A | Discharge status: Home / Self Care | Payer: Commercial Managed Care - HMO | Source: Ambulatory Visit | Attending: Family Medicine | Admitting: Family Medicine

## 2022-01-01 VITALS — BP 114/78 | HR 80 | Wt 324.6 lb

## 2022-01-01 DIAGNOSIS — Z7901 Long term (current) use of anticoagulants: Secondary | ICD-10-CM | POA: Diagnosis not present

## 2022-01-01 DIAGNOSIS — Z6841 Body Mass Index (BMI) 40.0 and over, adult: Secondary | ICD-10-CM | POA: Diagnosis not present

## 2022-01-01 DIAGNOSIS — I509 Heart failure, unspecified: Secondary | ICD-10-CM | POA: Diagnosis not present

## 2022-01-01 DIAGNOSIS — I5022 Chronic systolic (congestive) heart failure: Secondary | ICD-10-CM | POA: Insufficient documentation

## 2022-01-01 DIAGNOSIS — Z79899 Other long term (current) drug therapy: Secondary | ICD-10-CM | POA: Diagnosis not present

## 2022-01-01 DIAGNOSIS — E669 Obesity, unspecified: Secondary | ICD-10-CM | POA: Diagnosis not present

## 2022-01-01 DIAGNOSIS — D638 Anemia in other chronic diseases classified elsewhere: Secondary | ICD-10-CM | POA: Diagnosis not present

## 2022-01-01 DIAGNOSIS — Z8249 Family history of ischemic heart disease and other diseases of the circulatory system: Secondary | ICD-10-CM | POA: Diagnosis not present

## 2022-01-01 DIAGNOSIS — G35 Multiple sclerosis: Secondary | ICD-10-CM | POA: Diagnosis not present

## 2022-01-01 DIAGNOSIS — I428 Other cardiomyopathies: Secondary | ICD-10-CM | POA: Insufficient documentation

## 2022-01-01 DIAGNOSIS — D259 Leiomyoma of uterus, unspecified: Secondary | ICD-10-CM | POA: Insufficient documentation

## 2022-01-01 LAB — BASIC METABOLIC PANEL
Anion gap: 5 (ref 5–15)
BUN: 14 mg/dL (ref 6–20)
CO2: 24 mmol/L (ref 22–32)
Calcium: 8.8 mg/dL — ABNORMAL LOW (ref 8.9–10.3)
Chloride: 110 mmol/L (ref 98–111)
Creatinine, Ser: 0.68 mg/dL (ref 0.44–1.00)
GFR, Estimated: >60 mL/min (ref >60)
Glucose, Bld: 84 mg/dL (ref 70–99)
Potassium: 3.8 mmol/L (ref 3.5–5.1)
Sodium: 139 mmol/L (ref 135–145)

## 2022-01-01 NOTE — Patient Instructions (Signed)
It was great to see you today! No medication changes are needed at this time.  Labs today We will only contact you if something comes back abnormal or we need to make some changes. Otherwise no news is good news!  Keep cardiology follow up as scheduled   Do the following things EVERYDAY: Weigh yourself in the morning before breakfast. Write it down and keep it in a log. Take your medicines as prescribed Eat low salt foods--Limit salt (sodium) to 2000 mg per day.  Stay as active as you can everyday Limit all fluids for the day to less than 2 liters  At the [facility], you and your health needs are our priority. As part of our continuing mission to provide you with exceptional heart care, we have created designated Provider Care Teams. These Care Teams include your primary Cardiologist (physician) and Advanced Practice Providers (APPs- Physician Assistants and Nurse Practitioners) who all work together to provide you with the care you need, when you need it.   You may see any of the following providers on your designated Care Team at your next follow up: Dr Daniel Bensimhon Dr Dalton McLean Amy Clegg, NP Brittainy Simmons, PA Jessica Milford,NP Lindsay Finch, PA Lauren Kemp, PharmD   Please be sure to bring in all your medications bottles to every appointment.   

## 2022-01-01 NOTE — Addendum Note (Signed)
Encounter addended by: Rafael Bihari, FNP on: 01/01/2022 2:47 PM  Actions taken: Clinical Note Signed

## 2022-01-01 NOTE — Progress Notes (Addendum)
ID:  JAVAYA OREGON, DOB [birth year], MRN [id number]   Provider location: [place] [facility] Type of Visit: Established patient   PCP:  [name], PA  HF Cardiologist:  Dr. Aundra Dubin.    History of Present Illness: Amanda [name] is a 41 y.o. who has a history of multiple sclerosis, anemia due to uterine fibroids, and systolic HF (dx 08/4479).   Admitted to [facility] 5/31-01/13/18  with volume overload and found to have acute systolic heart failure. Echo showed EF 10-15%. HF team consulted. R/LHC completed, which showed no CAD and preserved CO. Cardiac MRI showed no LGE, EF 14%. She was diuresed with IV lasix and transitioned to lasix 40 mg daily. HF meds were optimized. DC weight: 299 lbs.   Echo in 11/18 showed EF 35% with diffuse hypokinesis.    She has been unable to tolerate even low dose Bidil.   Echo in 4/20 showed EF 40%, mild LVH, normal RV, normal IVC.   Echo in 5/21 showed EF 45%, normal RV.   Echo in 5/22 showed EF 40%, diffuse hypokinesis, normal RV, mild MR.   Follow up 4/23, had been off most meds x 1 month. NYHA II symptoms. GDMT restarted, no Wilder Glade as she is uncomfortable with possible side effects.   Today she returns for HF follow up. Overall feeling fine. No issues with work duties (works part time at [employer]). Denies increasing SOB, CP, dizziness, edema, or PND/Orthopnea. Appetite ok. No fever or chills. Weight at home 170 pounds. Taking all medications.   ECG (personally reviewed): none ordered today.  Labs (6/19): K 4.3, creatinine 0.88 Labs (10/19): K 3.4, creatinine 0.8 Labs (11/19): K 4.1, creatinine 0.74 Labs (4/20): K 4.1, creatinine 0.78 Labs (7/20): K 4.3, creatinine 0.68 Labs (12/20): K 3.7, creatinine 0.74 Labs (2/21): K 4, creatinine 0.64 Labs (6/21): TSH normal, LDL 94, K 4.4, creatinine 0.69          Labs (2/22): K 3.9, creatinine 0.65, hgb 9.3      Labs (10/22): creatinine 0.65   Labs (4/23): K 3.8, creatinine  0.67    PMH:  1. Multiple sclerosis 2. Uterine fibroids 3. Anemia: Likely related to fibroids.  4. Chronic systolic CHF: Diagnosed in 5/19.  Nonischemic cardiomyopathy.  - Echo (6/19) with EF 10-15%.  - RHC/LHC (6/19): mean RA 5, PA 30/13 mean 22, mean PCWP 9, CI 2.63.  No angiographic CAD.  - Cardiac MRI (6/19): EF 14% with severely dilated LV, mildly dilated RV with mildly decreased systolic function, no myocardial LGE.  - Echo (8/19): EF 30%, diffuse hypokinesis, mildly dilated RV with normal systolic function, PASP 40 mmHg.  - Echo (11/19): EF 35%, diffuse hypokinesis, mildly dilated RV with normal systolic function.  - Echo (4/20): EF 40% with mild LV dilation and mild LVH, normal RV, normal IVC.  - Echo (5/21): EF 45%, mild LVH, normal RV, normal IVC - Echo (5/22): EF 40%, diffuse hypokinesis, normal RV, mild MR. 5. Sleep study (9/19): No OSA.  6. BPPV 7. COVID-19 12/22  Past Surgical History:  Procedure Laterality Date   CARDIAC CATHETERIZATION  01/12/2018   Current Outpatient Medications  Medication Sig Dispense Refill   acetaminophen (TYLENOL) 325 MG tablet 325 mg. Patient takes as needed.     carvedilol (COREG) 12.5 MG tablet Take 1 tablet (12.5 mg total) by mouth 2 (two) times daily with a meal. 180 tablet 3   ferrous sulfate 325 (65 FE) MG tablet  Take 1 tablet (325 mg total) by mouth 2 (two) times daily with a meal. 60 tablet 0   ibuprofen (ADVIL,MOTRIN) 200 MG tablet Take 200 mg by mouth every 6 (six) hours as needed for headache.     meclizine (ANTIVERT) 25 MG tablet Take 1 tablet by mouth every 6 to 12 hours as needed for dizziness 30 tablet 2   sacubitril-valsartan (ENTRESTO) 97-103 MG Take 1 tablet by mouth 2 (two) times daily. 180 tablet 3   spironolactone (ALDACTONE) 25 MG tablet Take 1 tablet (25 mg total) by mouth at bedtime. 90 tablet 3   No current facility-administered medications for this encounter.   Allergies:   Patient has no known allergies.   Social  History:  The patient  reports that she has never smoked. She has never used smokeless tobacco. She reports that she does not drink alcohol and does not use drugs.   Family History:  The patient's family history includes Diabetes in her father and mother; Fibroids in her sister; Heart disease in her mother; Hypertension in her mother; Obesity in her mother; Stroke in her mother.   ROS:  Please see the history of present illness.   All other systems are personally reviewed and negative.   Exam:   BP 114/78   Pulse 80   Wt (!) 147.2 kg (324 lb 9.6 oz)   SpO2 100%   BMI 50.84 kg/m  General:  NAD. No resp difficulty HEENT: Normal Neck: Supple. No JVD. Carotids 2+ bilat; no bruits. No lymphadenopathy or thryomegaly appreciated. Cor: PMI nondisplaced. Regular rate & rhythm. No rubs, gallops or murmurs. Lungs: Clear Abdomen: Obese, nontender, nondistended. No hepatosplenomegaly. No bruits or masses. Good bowel sounds. Extremities: No cyanosis, clubbing, rash, edema Neuro: Alert & oriented x 3, cranial nerves grossly intact. Moves all 4 extremities w/o difficulty. Affect pleasant.  Recent Labs: 05/24/2021: Hemoglobin 10.1; Platelets 257 12/03/2021: B Natriuretic Peptide 90.3; BUN 9; Creatinine, Ser 0.67; Potassium 3.8; Sodium 140  Personally reviewed   Wt Readings from Last 3 Encounters:  01/01/22 (!) 147.2 kg (324 lb 9.6 oz)  12/03/21 (!) 148.4 kg (327 lb 4 oz)  05/24/21 (!) 152.8 kg (336 lb 12.8 oz)    ASSESSMENT AND PLAN: 1. Chronic systolic CHF: Echo 2/37 with EF 10-15%, moderate MR. Nonischemic cardiomyopathy with no coronary disease on 6/19 cath. 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 Mother developed CHF in her 49s but no other family members have CHF that she knows of (has several healthy siblings).  She does not drink ETOH or use drugs.  No children, has not been pregnant so not peri-partum CMP.  TSH normal.  RHC 01/12/18 with normal filling pressures and preserved CO. Cardiac MRI  01/12/18 did not show LGE, EF 14%. Repeat echo 11/19 showed EF up to 35%.  Echo in 4/20 showed EF 40%.  Echo in 5/21 showed EF up to 45%.  Echo in 5/22 showed EF 40%.   NYHA class II symptoms.  She is not overloaded on exam.  - Continue Entresto 97/103 mg bid. BMET today.  - Continue Coreg 12.5 mg bid.  - Continue spironolactone 25 mg daily.  - She is scared of SGLT2 inhibitor use after reading about side effects (does not want to take).   - She did not tolerate even low dose Bidil.   - EF is now out of ICD range.  - Echo arranged in 2 months. 2. Obesity: Body mass index is 50.84 kg/m. - We again discussed weight  loss with diet and exercise.   - She would probably be a good semaglutide candidate.  She will think about it and let me know if she wants to start it.   Follow up in 2 months with Dr. Aundra Dubin + echo , as scheduled.  Cassandria Santee, FNP-BC 01/01/2022  [facility] [address] Heart and [place] [address] [phone] (office) [phone] (fax)

## 2022-03-11 ENCOUNTER — Ambulatory Visit (HOSPITAL_COMMUNITY): Payer: Commercial Managed Care - HMO | Attending: Cardiology | Admitting: Cardiology

## 2022-03-11 ENCOUNTER — Ambulatory Visit (HOSPITAL_COMMUNITY): Admission: RE | Admit: 2022-03-11 | Payer: Commercial Managed Care - HMO | Source: Ambulatory Visit

## 2022-03-11 ENCOUNTER — Encounter (HOSPITAL_COMMUNITY): Payer: Self-pay

## 2022-03-20 ENCOUNTER — Encounter (INDEPENDENT_AMBULATORY_CARE_PROVIDER_SITE_OTHER): Payer: Self-pay

## 2022-05-14 ENCOUNTER — Telehealth (HOSPITAL_COMMUNITY): Payer: Self-pay

## 2022-05-14 NOTE — Telephone Encounter (Signed)
Received a fax requesting medical records from [facility]. Records were successfully faxed to: [phone] ,which was the number provided.. Medical request form will be scanned into patients chart.

## 2022-05-30 ENCOUNTER — Other Ambulatory Visit (HOSPITAL_COMMUNITY): Payer: Self-pay

## 2022-05-30 ENCOUNTER — Ambulatory Visit (HOSPITAL_BASED_OUTPATIENT_CLINIC_OR_DEPARTMENT_OTHER)
Admission: RE | Admit: 2022-05-30 | Discharge: 2022-05-30 | Disposition: A | Discharge status: Home / Self Care | Payer: Commercial Managed Care - HMO | Source: Ambulatory Visit | Attending: Cardiology | Admitting: Cardiology

## 2022-05-30 ENCOUNTER — Ambulatory Visit (HOSPITAL_COMMUNITY)
Admission: RE | Admit: 2022-05-30 | Discharge: 2022-05-30 | Disposition: A | Discharge status: Home / Self Care | Payer: Commercial Managed Care - HMO | Source: Ambulatory Visit | Attending: Cardiology | Admitting: Cardiology

## 2022-05-30 VITALS — BP 130/84 | HR 85 | Wt 318.6 lb

## 2022-05-30 DIAGNOSIS — Z8616 Personal history of COVID-19: Secondary | ICD-10-CM | POA: Diagnosis not present

## 2022-05-30 DIAGNOSIS — G35 Multiple sclerosis: Secondary | ICD-10-CM | POA: Insufficient documentation

## 2022-05-30 DIAGNOSIS — Z86018 Personal history of other benign neoplasm: Secondary | ICD-10-CM | POA: Insufficient documentation

## 2022-05-30 DIAGNOSIS — I428 Other cardiomyopathies: Secondary | ICD-10-CM | POA: Diagnosis present

## 2022-05-30 DIAGNOSIS — Z79899 Other long term (current) drug therapy: Secondary | ICD-10-CM | POA: Diagnosis not present

## 2022-05-30 DIAGNOSIS — R42 Dizziness and giddiness: Secondary | ICD-10-CM | POA: Insufficient documentation

## 2022-05-30 DIAGNOSIS — I5022 Chronic systolic (congestive) heart failure: Secondary | ICD-10-CM

## 2022-05-30 DIAGNOSIS — I509 Heart failure, unspecified: Secondary | ICD-10-CM

## 2022-05-30 DIAGNOSIS — E669 Obesity, unspecified: Secondary | ICD-10-CM | POA: Insufficient documentation

## 2022-05-30 DIAGNOSIS — Z6841 Body Mass Index (BMI) 40.0 and over, adult: Secondary | ICD-10-CM | POA: Insufficient documentation

## 2022-05-30 LAB — BASIC METABOLIC PANEL
Anion gap: 6 (ref 5–15)
BUN: 9 mg/dL (ref 6–20)
CO2: 25 mmol/L (ref 22–32)
Calcium: 9 mg/dL (ref 8.9–10.3)
Chloride: 108 mmol/L (ref 98–111)
Creatinine, Ser: 0.7 mg/dL (ref 0.44–1.00)
GFR, Estimated: >60 mL/min (ref >60)
Glucose, Bld: 93 mg/dL (ref 70–99)
Potassium: 3.9 mmol/L (ref 3.5–5.1)
Sodium: 139 mmol/L (ref 135–145)

## 2022-05-30 LAB — ECHOCARDIOGRAM COMPLETE
Calc EF: 44.4 %
S' Lateral: 5 cm
Single Plane A2C EF: 46.9 %
Single Plane A4C EF: 44.8 %

## 2022-05-30 LAB — BRAIN NATRIURETIC PEPTIDE: B Natriuretic Peptide: 78.8 pg/mL (ref 0.0–100.0)

## 2022-05-30 MED ORDER — DAPAGLIFLOZIN PROPANEDIOL 10 MG PO TABS: 10.0000 mg | ORAL_TABLET | Freq: Every day | ORAL | 11 refills | Status: DC

## 2022-05-30 NOTE — Progress Notes (Signed)
ID:  Amanda [name], DOB 06/26/1981, MRN [id number]   Provider location: [place] [facility] Type of Visit: Established patient   PCP:  [name], PA  Cardiologist:  Dr. Aundra Dubin.    History of Present Illness: Amanda [name] is a 41 y.o. who has a history of multiple sclerosis, anemia due to uterine fibroids, and systolic HF (dx 03/164).   Admitted to [facility] 5/31-01/13/18  with volume overload and found to have acute systolic heart failure. Echo showed EF 10-15%. HF team consulted. R/LHC completed, which showed no CAD and preserved CO. Cardiac MRI showed no LGE, EF 14%. She was diuresed with IV lasix and transitioned to lasix 40 mg daily. HF meds were optimized. DC weight: 299 lbs.   Echo in 11/18 showed EF 35% with diffuse hypokinesis.    She has been unable to tolerate even low dose Bidil.   Echo in 4/20 showed EF 40%, mild LVH, normal RV, normal IVC.   Echo in 5/21 showed EF 45%, normal RV.   Echo in 5/22 showed EF 40%, diffuse hypokinesis, normal RV, mild MR.   Echo was done today and reviewed, EF 40-45%, global hypokinesis, normal RV.   She returns today for followup of CHF.  She has been doing well symptomatically.  No dyspnea walking on flat ground. Occasional lightheadedness.  Mild dyspnea walking up stairs.  She is short of breath walking long distances.  No orthopnea/PND.  Weight down 6 lbs.   ECG (personally reviewed): NSR, LVH  Labs (6/19): K 4.3, creatinine 0.88 Labs (10/19): K 3.4, creatinine 0.8 Labs (11/19): K 4.1, creatinine 0.74 Labs (4/20): K 4.1, creatinine 0.78 Labs (7/20): K 4.3, creatinine 0.68 Labs (12/20): K 3.7, creatinine 0.74 Labs (2/21): K 4, creatinine 0.64 Labs (6/21): TSH normal, LDL 94, K 4.4, creatinine 0.69          Labs (2/22): K 3.9, creatinine 0.65, hgb 9.3      Labs (10/22): creatinine 0.65   Labs (5/23): K 3.8, creatinine 0.68   PMH:  1. Multiple sclerosis 2. Uterine fibroids 3. Anemia: Likely related to fibroids.   4. Chronic systolic CHF: Diagnosed in 5/19.  Nonischemic cardiomyopathy.  - Echo (6/19) with EF 10-15%.  - RHC/LHC (6/19): mean RA 5, PA 30/13 mean 22, mean PCWP 9, CI 2.63.  No angiographic CAD.  - Cardiac MRI (6/19): EF 14% with severely dilated LV, mildly dilated RV with mildly decreased systolic function, no myocardial LGE.  - Echo (8/19): EF 30%, diffuse hypokinesis, mildly dilated RV with normal systolic function, PASP 40 mmHg.  - Echo (11/19): EF 35%, diffuse hypokinesis, mildly dilated RV with normal systolic function.  - Echo (4/20): EF 40% with mild LV dilation and mild LVH, normal RV, normal IVC.  - Echo (5/21): EF 45%, mild LVH, normal RV, normal IVC - Echo (5/22): EF 40%, diffuse hypokinesis, normal RV, mild MR. - Echo (10/23): EF 40-45%, global hypokinesis, normal RV.  5. Sleep study (9/19): No OSA.  6. BPPV 7. COVID-19 12/22  Past Surgical History:  Procedure Laterality Date   CARDIAC CATHETERIZATION  01/12/2018     Current Outpatient Medications  Medication Sig Dispense Refill   acetaminophen (TYLENOL) 325 MG tablet 325 mg. Patient takes as needed.     carvedilol (COREG) 12.5 MG tablet Take 1 tablet (12.5 mg total) by mouth 2 (two) times daily with a meal. 180 tablet 3   dapagliflozin propanediol (FARXIGA) 10 MG TABS tablet Take 1 tablet (10  mg total) by mouth daily before breakfast. 30 tablet 11   ferrous sulfate 325 (65 FE) MG tablet Take 1 tablet (325 mg total) by mouth 2 (two) times daily with a meal. 60 tablet 0   ibuprofen (ADVIL,MOTRIN) 200 MG tablet Take 200 mg by mouth every 6 (six) hours as needed for headache.     meclizine (ANTIVERT) 25 MG tablet Take 1 tablet by mouth every 6 to 12 hours as needed for dizziness 30 tablet 2   sacubitril-valsartan (ENTRESTO) 97-103 MG Take 1 tablet by mouth 2 (two) times daily. 180 tablet 3   spironolactone (ALDACTONE) 25 MG tablet Take 1 tablet (25 mg total) by mouth at bedtime. 90 tablet 3   No current  facility-administered medications for this encounter.    Allergies:   Patient has no known allergies.   Social History:  The patient  reports that she has never smoked. She has never used smokeless tobacco. She reports that she does not drink alcohol and does not use drugs.   Family History:  The patient's family history includes Diabetes in her father and mother; Fibroids in her sister; Heart disease in her mother; Hypertension in her mother; Obesity in her mother; Stroke in her mother.   ROS:  Please see the history of present illness.   All other systems are personally reviewed and negative.   Exam:   BP 130/84   Pulse 85   Wt (!) 144.5 kg (318 lb 9.6 oz)   SpO2 100%   BMI 49.90 kg/m  General: NAD, obese Neck: No JVD, no thyromegaly or thyroid nodule.  Lungs: Clear to auscultation bilaterally with normal respiratory effort. CV: Nondisplaced PMI.  Heart regular S1/S2, no S3/S4, no murmur.  No peripheral edema.  No carotid bruit.  Normal pedal pulses.  Abdomen: Soft, nontender, no hepatosplenomegaly, no distention.  Skin: Intact without lesions or rashes.  Neurologic: Alert and oriented x 3.  Psych: Normal affect. Extremities: No clubbing or cyanosis.  HEENT: Normal.   Recent Labs: 05/30/2022: B Natriuretic Peptide 78.8; BUN 9; Creatinine, Ser 0.70; Potassium 3.9; Sodium 139  Personally reviewed   Wt Readings from Last 3 Encounters:  05/30/22 (!) 144.5 kg (318 lb 9.6 oz)  01/01/22 (!) 147.2 kg (324 lb 9.6 oz)  12/03/21 (!) 148.4 kg (327 lb 4 oz)     ASSESSMENT AND PLAN:  1. Chronic systolic CHF: Echo 0/76 with EF 10-15%, moderate MR. Nonischemic cardiomyopathy with no coronary disease on 6/19 cath. 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 Mother developed CHF in her 33s but no other family members have CHF that she knows of (has several healthy siblings).  She does not drink ETOH or use drugs.  No children, has not been pregnant so not peri-partum CMP.  TSH normal.  RHC  01/12/18 with normal filling pressures and preserved CO. Cardiac MRI 01/12/18 did not show LGE, EF 14%. Repeat echo 11/19 showed EF up to 35%.  Echo in 4/20 showed EF 40%.  Echo in 5/21 showed EF up to 45%.  Echo in 5/22 showed EF 40%.  Echo in 10/23 showed EF 40-45%, global hypokinesis, normal RV.   NYHA class II symptoms.  Weight is down 6 lbs, she is not volume overloaded on exam.    - Continue Entresto 97/103 bid. BMET/BNP today.  - Continue Coreg 12.5 mg twice a day.  - Continue spironolactone 25 daily.  - We discussed [place] today.  She does not have a history of frequent UTIs or yeast  infections.  I think she could safely take [place].  She is willing to try now, and will start Farxiga 10 mg daily.  BMET 10 days.  - She did not tolerate even low dose Bidil.   - EF is now out of ICD range.  2. Obesity: We again discussed weight loss with diet and exercise.   - She would be a good semaglutide candidate.  Given current shortage (and she is not a diabetic), will look into getting it for her at next appt.   Followup in 4 months with APP  Signed, Loralie Champagne, MD  05/30/2022  [facility] [place] [address] Heart and [place] [address] [phone] (office) [phone] (fax)

## 2022-05-30 NOTE — Patient Instructions (Addendum)
START Farxiga '10mg'$    Labs done today, your results will be available in MyChart, we will contact you for abnormal readings.  Repeat blood work in 10 days.  Your physician recommends that you schedule a follow-up appointment in: 4 months ( February 2024)  ** please call the office in December to arrange your follow up appointment **  If you have any questions or concerns before your next appointment please send [place] a message through [place] or call our office at [phone].    TO LEAVE A MESSAGE FOR THE NURSE SELECT OPTION 2, PLEASE LEAVE A MESSAGE INCLUDING: YOUR NAME DATE OF BIRTH CALL BACK NUMBER REASON FOR CALL**this is important as we prioritize the call backs  YOU WILL RECEIVE A CALL BACK THE SAME DAY AS LONG AS YOU CALL BEFORE 4:00 PM  At the [facility], you and your health needs are our priority. As part of our continuing mission to provide you with exceptional heart care, we have created designated Provider Care Teams. These Care Teams include your primary Cardiologist (physician) and Advanced Practice Providers (APPs- Physician Assistants and Nurse Practitioners) who all work together to provide you with the care you need, when you need it.   You may see any of the following providers on your designated Care Team at your next follow up: Dr Glori Bickers Dr Loralie Champagne Dr. Roxana Hires, NP Lyda Jester, [place] [facility], [place] Forestine Na, NP Audry Riles, PharmD   Please be sure to bring in all your medications bottles to every appointment.

## 2022-06-07 ENCOUNTER — Ambulatory Visit (HOSPITAL_COMMUNITY)
Admission: RE | Admit: 2022-06-07 | Discharge: 2022-06-07 | Disposition: A | Discharge status: Home / Self Care | Payer: Commercial Managed Care - HMO | Source: Ambulatory Visit | Attending: Cardiology | Admitting: Cardiology

## 2022-06-07 DIAGNOSIS — I509 Heart failure, unspecified: Secondary | ICD-10-CM | POA: Diagnosis present

## 2022-06-07 LAB — BASIC METABOLIC PANEL
Anion gap: 6 (ref 5–15)
BUN: 12 mg/dL (ref 6–20)
CO2: 24 mmol/L (ref 22–32)
Calcium: 9 mg/dL (ref 8.9–10.3)
Chloride: 109 mmol/L (ref 98–111)
Creatinine, Ser: 0.69 mg/dL (ref 0.44–1.00)
GFR, Estimated: >60 mL/min (ref >60)
Glucose, Bld: 93 mg/dL (ref 70–99)
Potassium: 4.3 mmol/L (ref 3.5–5.1)
Sodium: 139 mmol/L (ref 135–145)

## 2022-06-24 ENCOUNTER — Encounter (HOSPITAL_COMMUNITY): Payer: Self-pay

## 2022-06-24 ENCOUNTER — Ambulatory Visit (HOSPITAL_COMMUNITY)
Admission: EM | Admit: 2022-06-24 | Discharge: 2022-06-24 | Disposition: A | Discharge status: Home / Self Care | Payer: Commercial Managed Care - HMO | Attending: Family Medicine | Admitting: Family Medicine

## 2022-06-24 DIAGNOSIS — U071 COVID-19: Secondary | ICD-10-CM

## 2022-06-24 MED ORDER — NIRMATRELVIR/RITONAVIR (PAXLOVID)TABLET: ORAL_TABLET | ORAL | 0 refills | Status: DC

## 2022-06-24 NOTE — Discharge Instructions (Signed)
Take nirmatrelvir 150 mg --2 tablets twice daily for 5 days, plus also ritonavir 100 mg--1 tablet twice daily for 5 days.  These are antiviral medicines, meant to keep you from getting worse with a COVID-19 infection  You should quarantine for the first 5 days of your illness, and then you can wear a mask out in public for days 6 through 10.

## 2022-06-24 NOTE — ED Triage Notes (Signed)
Pt tested positive for covid on 06/23/2022. Pt needs a Dr. Radene Ou

## 2022-06-24 NOTE — ED Provider Notes (Signed)
[facility]    CSN: [id number] Arrival date & time: 06/24/22  0809      History   Chief Complaint Chief Complaint  Patient presents with   Covid Positive    HPI Amanda [name] is a 41 y.o. female.   HPI Here for cough and congestion that began on November 10.  She has had fever to 100.9.  No nausea or vomiting or diarrhea.  She is felt a little short of breath when moving around or when coughing a lot.  TheraFlu has ended up helping her cough.  Medical history is significant for multiple sclerosis and congestive heart failure.  Last EGFR was over 60  Last menstrual cycle was October 18  [name] test was positive on November 12, yesterday  Past Medical History:  Diagnosis Date   Anemia    BP (high blood pressure)    Chest pain    CHF (congestive heart failure) (HCC)    Edema, lower extremity    History of blood transfusion 08/2017   "related to low blood count"    Infertility, female    Joint pain    Multiple sclerosis ([place])    Pneumonia 12/2017   Shortness of breath    Swallowing difficulty    Vitamin D deficiency     Patient Active Problem List   Diagnosis Date Noted   Essential hypertension 02/08/2020   Vitamin D deficiency 02/08/2020   Depression 02/08/2020   History of anemia-  uterine fibroids 02/08/2020   History of uterine fibroid 02/08/2020   Congestive heart failure ([place]) 02/08/2020   At risk for diabetes mellitus 79/09/4095   Acute systolic (congestive) heart failure ([place]) 01/09/2018   Idiopathic cardiomyopathy ([place]) 01/09/2018   Uterine fibroid 09/07/2017   Multiple sclerosis (HCC)    Class 3 severe obesity with serious comorbidity and body mass index (BMI) of 50.0 to 59.9 in adult [facility])     Past Surgical History:  Procedure Laterality Date   CARDIAC CATHETERIZATION  01/12/2018    OB History     Gravida  0   Para  0   Term  0   Preterm  0   AB  0   Living  0      SAB  0   IAB  0   Ectopic  0   Multiple   0   Live Births  0            Home Medications    Prior to Admission medications   Medication Sig Start Date End Date Taking? Authorizing Provider  nirmatrelvir/ritonavir EUA (PAXLOVID) 20 x 150 MG & 10 x 100MG TABS Patient GFR is >60. Take nirmatrelvir (150 mg) two tablets twice daily for 5 days and ritonavir (100 mg) one tablet twice daily for 5 days. 06/24/22  Yes Barrett Henle, MD  acetaminophen (TYLENOL) 325 MG tablet 325 mg. Patient takes as needed.    [provider]  carvedilol (COREG) 12.5 MG tablet Take 1 tablet (12.5 mg total) by mouth 2 (two) times daily with a meal. 12/10/21   Larey Dresser, MD  dapagliflozin propanediol (FARXIGA) 10 MG TABS tablet Take 1 tablet (10 mg total) by mouth daily before breakfast. 05/30/22   Larey Dresser, MD  ferrous sulfate 325 (65 FE) MG tablet Take 1 tablet (325 mg total) by mouth 2 (two) times daily with a meal. 02/23/20   Opalski, Deborah, DO  ibuprofen (ADVIL,MOTRIN) 200 MG tablet Take 200 mg by  mouth every 6 (six) hours as needed for headache.    [provider]  meclizine (ANTIVERT) 25 MG tablet Take 1 tablet by mouth every 6 to 12 hours as needed for dizziness 05/24/21   Allena Katz M, FNP  sacubitril-valsartan (ENTRESTO) 97-103 MG Take 1 tablet by mouth 2 (two) times daily. 12/10/21   Larey Dresser, MD  spironolactone (ALDACTONE) 25 MG tablet Take 1 tablet (25 mg total) by mouth at bedtime. 12/10/21   Larey Dresser, MD    Family History Family History  Problem Relation Age of Onset   Diabetes Mother    Hypertension Mother    Heart disease Mother    Stroke Mother    Obesity Mother    Fibroids Sister    Diabetes Father     Social History Social History   Tobacco Use   Smoking status: Never   Smokeless tobacco: Never  Vaping Use   Vaping Use: Never used  Substance Use Topics   Alcohol use: Never   Drug use: Never     Allergies   Patient has no known allergies.   Review of  Systems Review of Systems   Physical Exam Triage Vital Signs ED Triage Vitals  Enc Vitals Group     BP 06/24/22 0854 130/80     Pulse Rate 06/24/22 0854 98     Resp 06/24/22 0854 12     Temp 06/24/22 0854 98.7 F (37.1 C)     Temp Source 06/24/22 0854 Oral     SpO2 06/24/22 0854 98 %     Weight --      Height --      Head Circumference --      Peak Flow --      Pain Score 06/24/22 0856 0     Pain Loc --      Pain Edu? --      Excl. in [place]? --    No data found.  Updated Vital Signs BP 130/80 (BP Location: Left Arm)   Pulse 98   Temp 98.7 F (37.1 C) (Oral)   Resp 12   LMP 05/29/2022   SpO2 98%   Visual Acuity Right Eye Distance:   Left Eye Distance:   Bilateral Distance:    Right Eye Near:   Left Eye Near:    Bilateral Near:     Physical Exam Vitals reviewed.  Constitutional:      General: She is not in acute distress.    Appearance: She is not toxic-appearing.  HENT:     Right Ear: Tympanic membrane and ear canal normal.     Left Ear: Tympanic membrane and ear canal normal.     Nose: Nose normal.     Mouth/Throat:     Mouth: Mucous membranes are moist.     Comments: Clear mucus is in the oropharynx.  Tonsils are 1+ size and not erythematous Eyes:     Extraocular Movements: Extraocular movements intact.     Conjunctiva/sclera: Conjunctivae normal.     Pupils: Pupils are equal, round, and reactive to light.  Cardiovascular:     Rate and Rhythm: Normal rate and regular rhythm.     Heart sounds: No murmur heard. Pulmonary:     Effort: Pulmonary effort is normal. No respiratory distress.     Breath sounds: No stridor. No wheezing, rhonchi or rales.  Musculoskeletal:     Cervical back: Neck supple.  Lymphadenopathy:     Cervical: No cervical adenopathy.  Skin:  Capillary Refill: Capillary refill takes less than 2 seconds.     Coloration: Skin is not jaundiced or pale.  Neurological:     General: No focal deficit present.     Mental Status: She is  alert and oriented to person, place, and time.  Psychiatric:        Behavior: Behavior normal.      UC Treatments / Results  Labs (all labs ordered are listed, but only abnormal results are displayed) Labs Reviewed - No data to display  EKG   Radiology No results found.  Procedures Procedures (including critical care time)  Medications Ordered in UC Medications - No data to display  Initial Impression / Assessment and Plan / UC Course  I have reviewed the triage vital signs and the nursing notes.  Pertinent labs & imaging results that were available during my care of the patient were reviewed by me and considered in my medical decision making (see chart for details).        She is on day 3 of illness and has several risk factors for severe COVID illness.  Paxlovid is sent in.  The only interaction with her current medications is a mild when with the valsartan portion of her Entresto.  Up-to-date reference states that therapy does not need to be adjusted for this mild interaction Final Clinical Impressions(s) / UC Diagnoses   Final diagnoses:  COVID-19     Discharge Instructions      Take nirmatrelvir 150 mg --2 tablets twice daily for 5 days, plus also ritonavir 100 mg--1 tablet twice daily for 5 days.  These are antiviral medicines, meant to keep you from getting worse with a COVID-19 infection  You should quarantine for the first 5 days of your illness, and then you can wear a mask out in public for days 6 through 10.     ED Prescriptions     Medication Sig Dispense Auth. Provider   nirmatrelvir/ritonavir EUA (PAXLOVID) 20 x 150 MG & 10 x 100MG TABS Patient GFR is >60. Take nirmatrelvir (150 mg) two tablets twice daily for 5 days and ritonavir (100 mg) one tablet twice daily for 5 days. 30 tablet Lindsay Soulliere, Gwenlyn Perking, MD      PDMP not reviewed this encounter.   Barrett Henle, MD 06/24/22 [phone]

## 2022-07-30 ENCOUNTER — Observation Stay (HOSPITAL_COMMUNITY)
Admission: EM | Admit: 2022-07-30 | Discharge: 2022-07-31 | Disposition: A | Discharge status: Home / Self Care | Payer: Commercial Managed Care - HMO | Attending: Family Medicine | Admitting: Family Medicine

## 2022-07-30 ENCOUNTER — Other Ambulatory Visit: Payer: Self-pay

## 2022-07-30 ENCOUNTER — Emergency Department (HOSPITAL_COMMUNITY): Payer: Commercial Managed Care - HMO

## 2022-07-30 DIAGNOSIS — Z79899 Other long term (current) drug therapy: Secondary | ICD-10-CM | POA: Insufficient documentation

## 2022-07-30 DIAGNOSIS — D259 Leiomyoma of uterus, unspecified: Secondary | ICD-10-CM

## 2022-07-30 DIAGNOSIS — E669 Obesity, unspecified: Secondary | ICD-10-CM | POA: Diagnosis not present

## 2022-07-30 DIAGNOSIS — I10 Essential (primary) hypertension: Secondary | ICD-10-CM

## 2022-07-30 DIAGNOSIS — J101 Influenza due to other identified influenza virus with other respiratory manifestations: Secondary | ICD-10-CM | POA: Diagnosis not present

## 2022-07-30 DIAGNOSIS — D649 Anemia, unspecified: Secondary | ICD-10-CM | POA: Diagnosis not present

## 2022-07-30 DIAGNOSIS — Z1152 Encounter for screening for COVID-19: Secondary | ICD-10-CM | POA: Diagnosis not present

## 2022-07-30 DIAGNOSIS — I110 Hypertensive heart disease with heart failure: Secondary | ICD-10-CM | POA: Insufficient documentation

## 2022-07-30 DIAGNOSIS — I5022 Chronic systolic (congestive) heart failure: Secondary | ICD-10-CM | POA: Diagnosis not present

## 2022-07-30 DIAGNOSIS — D500 Iron deficiency anemia secondary to blood loss (chronic): Secondary | ICD-10-CM | POA: Diagnosis not present

## 2022-07-30 DIAGNOSIS — J111 Influenza due to unidentified influenza virus with other respiratory manifestations: Secondary | ICD-10-CM

## 2022-07-30 DIAGNOSIS — Z6841 Body Mass Index (BMI) 40.0 and over, adult: Secondary | ICD-10-CM | POA: Insufficient documentation

## 2022-07-30 DIAGNOSIS — I509 Heart failure, unspecified: Secondary | ICD-10-CM

## 2022-07-30 DIAGNOSIS — R0602 Shortness of breath: Secondary | ICD-10-CM

## 2022-07-30 DIAGNOSIS — D508 Other iron deficiency anemias: Secondary | ICD-10-CM

## 2022-07-30 LAB — CBC
HCT: 21.6 % — ABNORMAL LOW (ref 36.0–46.0)
Hemoglobin: 5.5 g/dL — CL (ref 12.0–15.0)
MCH: 16.4 pg — ABNORMAL LOW (ref 26.0–34.0)
MCHC: 25.5 g/dL — ABNORMAL LOW (ref 30.0–36.0)
MCV: 64.5 fL — ABNORMAL LOW (ref 80.0–100.0)
Platelets: 273 10*3/uL (ref 150–400)
RBC: 3.35 MIL/uL — ABNORMAL LOW (ref 3.87–5.11)
RDW: 20.6 % — ABNORMAL HIGH (ref 11.5–15.5)
WBC: 5 10*3/uL (ref 4.0–10.5)
nRBC: 0 % (ref 0.0–0.2)

## 2022-07-30 LAB — COMPREHENSIVE METABOLIC PANEL
ALT: 12 U/L (ref 0–44)
AST: 21 U/L (ref 15–41)
Albumin: 3.5 g/dL (ref 3.5–5.0)
Alkaline Phosphatase: 32 U/L — ABNORMAL LOW (ref 38–126)
Anion gap: 5 (ref 5–15)
BUN: 6 mg/dL (ref 6–20)
CO2: 24 mmol/L (ref 22–32)
Calcium: 8.8 mg/dL — ABNORMAL LOW (ref 8.9–10.3)
Chloride: 108 mmol/L (ref 98–111)
Creatinine, Ser: 0.69 mg/dL (ref 0.44–1.00)
GFR, Estimated: >60 mL/min (ref >60)
Glucose, Bld: 98 mg/dL (ref 70–99)
Potassium: 3.6 mmol/L (ref 3.5–5.1)
Sodium: 137 mmol/L (ref 135–145)
Total Bilirubin: 0.7 mg/dL (ref 0.3–1.2)
Total Protein: 7.3 g/dL (ref 6.5–8.1)

## 2022-07-30 LAB — RESP PANEL BY RT-PCR (RSV, FLU A&B, COVID)  RVPGX2
Influenza A by PCR: POSITIVE — AB
Influenza B by PCR: NEGATIVE
Resp Syncytial Virus by PCR: NEGATIVE
SARS Coronavirus 2 by RT PCR: NEGATIVE

## 2022-07-30 LAB — LIPASE, BLOOD: Lipase: 28 U/L (ref 11–51)

## 2022-07-30 LAB — I-STAT BETA HCG BLOOD, ED (MC, WL, AP ONLY): I-stat hCG, quantitative: <5 m[IU]/mL (ref <5)

## 2022-07-30 LAB — PREPARE RBC (CROSSMATCH)

## 2022-07-30 MED ORDER — IBUPROFEN 400 MG PO TABS
600.0000 mg | ORAL_TABLET | Freq: Four times a day (QID) | ORAL | Status: DC | PRN
  Administered 2022-07-30: 600 mg via ORAL
  Filled 2022-07-30: qty 1

## 2022-07-30 MED ORDER — HYDROCOD POLI-CHLORPHE POLI ER 10-8 MG/5ML PO SUER
5.0000 mL | Freq: Every day | ORAL | Status: DC
  Administered 2022-07-30: 5 mL via ORAL
  Filled 2022-07-30: qty 5

## 2022-07-30 MED ORDER — HYDROXYZINE HCL 25 MG PO TABS
25.0000 mg | ORAL_TABLET | Freq: Once | ORAL | Status: AC
  Administered 2022-07-30: 25 mg via ORAL
  Filled 2022-07-30: qty 1

## 2022-07-30 MED ORDER — ENOXAPARIN SODIUM 40 MG/0.4ML IJ SOSY: 40.0000 mg | PREFILLED_SYRINGE | INTRAMUSCULAR | Status: DC

## 2022-07-30 MED ORDER — ACETAMINOPHEN 325 MG PO TABS
650.0000 mg | ORAL_TABLET | Freq: Once | ORAL | Status: AC
  Administered 2022-07-30: 650 mg via ORAL
  Filled 2022-07-30: qty 2

## 2022-07-30 MED ORDER — IBUPROFEN 400 MG PO TABS: 600.0000 mg | ORAL_TABLET | Freq: Four times a day (QID) | ORAL | Status: DC | PRN

## 2022-07-30 MED ORDER — ONDANSETRON 4 MG PO TBDP
4.0000 mg | ORAL_TABLET | Freq: Once | ORAL | Status: AC | PRN
  Administered 2022-07-30: 4 mg via ORAL
  Filled 2022-07-30: qty 1

## 2022-07-30 MED ORDER — SODIUM CHLORIDE 0.9% IV SOLUTION: Freq: Once | INTRAVENOUS | Status: AC

## 2022-07-30 MED ORDER — ACETAMINOPHEN 500 MG PO TABS
1000.0000 mg | ORAL_TABLET | Freq: Once | ORAL | Status: AC
  Administered 2022-07-30: 1000 mg via ORAL
  Filled 2022-07-30: qty 2

## 2022-07-30 MED ORDER — ACETAMINOPHEN 325 MG PO TABS
650.0000 mg | ORAL_TABLET | Freq: Four times a day (QID) | ORAL | Status: DC | PRN
  Administered 2022-07-31: 650 mg via ORAL
  Filled 2022-07-30: qty 2

## 2022-07-30 MED ORDER — OSELTAMIVIR PHOSPHATE 75 MG PO CAPS
75.0000 mg | ORAL_CAPSULE | Freq: Two times a day (BID) | ORAL | Status: DC
  Administered 2022-07-30 – 2022-07-31 (×2): 75 mg via ORAL
  Filled 2022-07-30 (×3): qty 1

## 2022-07-30 NOTE — Assessment & Plan Note (Addendum)
-   Chest x-ray is negative and is stable on room air -Continue to monitor on pulse oximetry -Start Tamiflu BID

## 2022-07-30 NOTE — ED Notes (Addendum)
EDP Dr. Melina Copa at [facility]. Pt verbalizes h/o hemorrhagic fibroids. Denies upper or lower GI or urinary bleeding. Denies blood thinners.

## 2022-07-30 NOTE — ED Provider Triage Note (Signed)
Emergency Medicine Provider Triage Evaluation Note  Amanda [name] , a 41 y.o. female  was evaluated in triage.  Pt complains of cough and shortness of breath and bodyaches since last night.  Patient has history of multiple sclerosis and congestive heart failure, she is on Entresto, she reports the EF of 30% on her last echo  Review of Systems  Positive: Shortness of breath Negative: Chest pain  Physical Exam  BP 131/85 (BP Location: Left Arm)   Pulse (!) 119   Temp (!) 103.1 F (39.5 C) (Oral)   Resp 20   LMP 07/23/2022   SpO2 96%  Gen:   Awake, no distress   Resp:  Normal effort  MSK:   Moves extremities without difficulty  Other:    Medical Decision Making  Medically screening exam initiated at 2:25 PM.  Appropriate orders placed.  Blue L Bridwell was informed that the remainder of the evaluation will be completed by another provider, this initial triage assessment does not replace that evaluation, and the importance of remaining in the ED until their evaluation is complete.     Gwenevere Abbot, [place] 07/30/22 1427

## 2022-07-30 NOTE — ED Notes (Signed)
Hgb 5.5. Vernie Shanks, PA informed.

## 2022-07-30 NOTE — ED Notes (Signed)
Pt to exam room from w/r post triage by w/c, back to b/r prior to entering room.

## 2022-07-30 NOTE — Assessment & Plan Note (Addendum)
-  controlled  -Hold home antihypertensives for now with severe anemia

## 2022-07-30 NOTE — Assessment & Plan Note (Addendum)
-  EF of 40-45% on echocardiogram 05/2022 -will hold any antihypertensives and diuretics for now with severe anemia

## 2022-07-30 NOTE — H&P (Addendum)
History and Physical    Patient: Amanda [name] PVX:[id number] DOB: Sep 13, 1980 DOA: 07/30/2022 DOS: the patient was seen and examined on 07/30/2022 PCP: [name], PA  Patient coming from: Home  Chief Complaint:  Chief Complaint  Patient presents with   Shortness of Breath   Fever   HPI: [name] is a 41 y.o. female with medical history significant of nonischemic cardiomyopathy, chronic systolic HF (EF of 48-27% on 05/2022), multiple sclerosis, uterine fibroids with hx of anemia requiring transfusions who presents with cough, body ache.  Symptoms of cough, body ache, nausea and posttussis emesis started last night.  She was around her boss who was also sick.  Has been trying to take Tylenol for pain.  In the ED, she was febrile 103.73F, HR 102, 116/62 on room air and was positive for Flu A.  Significant anemia with Hgb at 5.5. with remote prior of around 10. BMP unremarkable.  CXR negative.   She reports heavy menstrual bleeding monthly due to her fibroids and has required blood transfusion in the past.  Last menstrual cycle was last week and was heavy.  Hospitalist was requested for admission for anemia and worsening flu symptoms.  Review of Systems: As mentioned in the history of present illness. All other systems reviewed and are negative. Past Medical History:  Diagnosis Date   Anemia    BP (high blood pressure)    Chest pain    CHF (congestive heart failure) (HCC)    Edema, lower extremity    History of blood transfusion 08/2017   "related to low blood count"    Infertility, female    Joint pain    Multiple sclerosis ([place])    Pneumonia 12/2017   Shortness of breath    Swallowing difficulty    Vitamin D deficiency    Past Surgical History:  Procedure Laterality Date   CARDIAC CATHETERIZATION  01/12/2018   Social History:  reports that she has never smoked. She has never used smokeless tobacco. She reports that she does not drink alcohol and does not use  drugs.  No Known Allergies  Family History  Problem Relation Age of Onset   Diabetes Mother    Hypertension Mother    Heart disease Mother    Stroke Mother    Obesity Mother    Fibroids Sister    Diabetes Father     Prior to Admission medications   Medication Sig Start Date End Date Taking? Authorizing Provider  acetaminophen (TYLENOL) 325 MG tablet 325 mg. Patient takes as needed.    [provider]  carvedilol (COREG) 12.5 MG tablet Take 1 tablet (12.5 mg total) by mouth 2 (two) times daily with a meal. 12/10/21   Larey Dresser, MD  dapagliflozin propanediol (FARXIGA) 10 MG TABS tablet Take 1 tablet (10 mg total) by mouth daily before breakfast. 05/30/22   Larey Dresser, MD  ferrous sulfate 325 (65 FE) MG tablet Take 1 tablet (325 mg total) by mouth 2 (two) times daily with a meal. 02/23/20   Opalski, Neoma Laming, DO  ibuprofen (ADVIL,MOTRIN) 200 MG tablet Take 200 mg by mouth every 6 (six) hours as needed for headache.    [provider]  meclizine (ANTIVERT) 25 MG tablet Take 1 tablet by mouth every 6 to 12 hours as needed for dizziness 05/24/21   Rafael Bihari, FNP  nirmatrelvir/ritonavir EUA (PAXLOVID) 20 x 150 MG & 10 x '100MG'$  TABS Patient GFR is >60. Take nirmatrelvir (150 mg)  two tablets twice daily for 5 days and ritonavir (100 mg) one tablet twice daily for 5 days. 06/24/22   Barrett Henle, MD  sacubitril-valsartan (ENTRESTO) 97-103 MG Take 1 tablet by mouth 2 (two) times daily. 12/10/21   Larey Dresser, MD  spironolactone (ALDACTONE) 25 MG tablet Take 1 tablet (25 mg total) by mouth at bedtime. 12/10/21   Larey Dresser, MD    Physical Exam: Vitals:   07/30/22 1649 07/30/22 1815 07/30/22 1855 07/30/22 1909  BP: 130/73 116/62 125/69 131/84  Pulse: (!) 114 (!) 102 (!) 101 (!) 102  Resp: 18 17 (!) 24 19  Temp: 100.3 F (37.9 C)  (!) 101.5 F (38.6 C) 100.1 F (37.8 C)  TempSrc: Oral  Oral Oral  SpO2: 97% 100% 100%    Constitutional: NAD,  calm, comfortable, ill-appearing fatigue young female laying upright in bed Eyes: lids and conjunctivae normal ENMT: Mucous membranes are moist.  Neck: normal, supple Respiratory: clear to auscultation bilaterally, no wheezing, no crackles. Normal respiratory effort. No accessory muscle use.  Cardiovascular: Tachycardia, no murmurs / rubs / gallops. No extremity edema.   Abdomen: no tenderness, Bowel sounds positive.  Musculoskeletal: no clubbing / cyanosis. No joint deformity upper and lower extremities. Good ROM, no contractures. Normal muscle tone.  Skin: no rashes, lesions, ulcers. No induration Neurologic: CN 2-12 grossly intact.  Strength 5/5 in all 4.  Psychiatric: Normal judgment and insight. Alert and oriented x 3. Normal mood. Data Reviewed:  See HPI  Assessment and Plan: * Anemia -Secondary to heavy menstrual cycle and history of uterine fibroids -Microcytic anemia with hemoglobin of 5.5 on presentation.  Remote prior hemoglobin around 10. -will transfuse 2u pRBC and follow post H/H -Transfuse for hemoglobin <7 -check iron panel   Influenza A - Chest x-ray is negative and is stable on room air -Continue to monitor on pulse oximetry -Start Tamiflu BID  Congestive heart failure (HCC) -EF of 40-45% on echocardiogram 05/2022 -will hold any antihypertensives and diuretics for now with severe anemia  Essential hypertension -controlled  -Hold home antihypertensives for now with severe anemia      Advance Care Planning: Full  Consults: None  Family Communication: None at bedside  Severity of Illness: The appropriate patient status for this patient is OBSERVATION. Observation status is judged to be reasonable and necessary in order to provide the required intensity of service to ensure the patient's safety. The patient's presenting symptoms, physical exam findings, and initial radiographic and laboratory data in the context of their medical condition is felt to place  them at decreased risk for further clinical deterioration. Furthermore, it is anticipated that the patient will be medically stable for discharge from the hospital within 2 midnights of admission.   Author: Orene Desanctis, DO 07/30/2022 8:47 PM  For on call review [email or web address].

## 2022-07-30 NOTE — ED Triage Notes (Signed)
Pt reports sob, cough, fever, and n/v since last night. 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

## 2022-07-30 NOTE — ED Provider Notes (Signed)
[facility] EMERGENCY DEPARTMENT Provider Note   CSN: [id number] Arrival date & time: 07/30/22  1323     History {Add pertinent medical, surgical, social history, OB history to HPI:1} Chief Complaint  Patient presents with   Shortness of Breath   Fever    Amanda [name] is a 41 y.o. female.  She has a history of cardiomyopathy.  She is complaining of shortness of breath cough body aches and fever that started yesterday.  Became more severe today.  Denies any chest pain.  Cough has been nonproductive.  She is not flu vaccinated.  She has required a transfusion in the past thought to be due to heavy vaginal bleeding from uterine fibroids.  She is not on blood thinners.  She has not seen any other obvious signs of bleeding.  The history is provided by the patient.  Shortness of Breath Severity:  Moderate Onset quality:  Gradual Duration:  2 days Timing:  Constant Progression:  Worsening Chronicity:  New Relieved by:  Nothing Worsened by:  Activity and coughing Ineffective treatments:  None tried Associated symptoms: cough and fever   Associated symptoms: no abdominal pain, no chest pain, no hemoptysis, no sputum production and no vomiting   Risk factors: no tobacco use   Fever Associated symptoms: cough and myalgias   Associated symptoms: no chest pain, no dysuria and no vomiting        Home Medications Prior to Admission medications   Medication Sig Start Date End Date Taking? Authorizing Provider  acetaminophen (TYLENOL) 325 MG tablet 325 mg. Patient takes as needed.    [provider]  carvedilol (COREG) 12.5 MG tablet Take 1 tablet (12.5 mg total) by mouth 2 (two) times daily with a meal. 12/10/21   Larey Dresser, MD  dapagliflozin propanediol (FARXIGA) 10 MG TABS tablet Take 1 tablet (10 mg total) by mouth daily before breakfast. 05/30/22   Larey Dresser, MD  ferrous sulfate 325 (65 FE) MG tablet Take 1 tablet (325 mg total) by mouth 2  (two) times daily with a meal. 02/23/20   Opalski, Neoma Laming, DO  ibuprofen (ADVIL,MOTRIN) 200 MG tablet Take 200 mg by mouth every 6 (six) hours as needed for headache.    [provider]  meclizine (ANTIVERT) 25 MG tablet Take 1 tablet by mouth every 6 to 12 hours as needed for dizziness 05/24/21   Rafael Bihari, FNP  nirmatrelvir/ritonavir EUA (PAXLOVID) 20 x 150 MG & 10 x '100MG'$  TABS Patient GFR is >60. Take nirmatrelvir (150 mg) two tablets twice daily for 5 days and ritonavir (100 mg) one tablet twice daily for 5 days. 06/24/22   Barrett Henle, MD  sacubitril-valsartan (ENTRESTO) 97-103 MG Take 1 tablet by mouth 2 (two) times daily. 12/10/21   Larey Dresser, MD  spironolactone (ALDACTONE) 25 MG tablet Take 1 tablet (25 mg total) by mouth at bedtime. 12/10/21   Larey Dresser, MD      Allergies    Patient has no known allergies.    Review of Systems   Review of Systems  Constitutional:  Positive for fever.  Eyes:  Negative for visual disturbance.  Respiratory:  Positive for cough and shortness of breath. Negative for hemoptysis and sputum production.   Cardiovascular:  Negative for chest pain.  Gastrointestinal:  Negative for abdominal pain and vomiting.  Genitourinary:  Negative for dysuria.  Musculoskeletal:  Positive for myalgias.    Physical Exam Updated Vital Signs BP 130/73 (BP  Location: Right Arm)   Pulse (!) 114   Temp 100.3 F (37.9 C) (Oral)   Resp 18   LMP 07/21/2022   SpO2 97%  Physical Exam Vitals and nursing note reviewed.  Constitutional:      General: She is not in acute distress.    Appearance: She is well-developed.  HENT:     Head: Normocephalic and atraumatic.  Eyes:     Conjunctiva/sclera: Conjunctivae normal.  Cardiovascular:     Rate and Rhythm: Regular rhythm. Tachycardia present.     Heart sounds: No murmur heard. Pulmonary:     Effort: Pulmonary effort is normal. No respiratory distress.     Breath sounds: Normal breath  sounds.  Abdominal:     Palpations: Abdomen is soft.     Tenderness: There is no abdominal tenderness.  Musculoskeletal:        General: No swelling. Normal range of motion.     Cervical back: Neck supple.     Right lower leg: No tenderness.     Left lower leg: No tenderness.  Skin:    General: Skin is warm and dry.     Capillary Refill: Capillary refill takes less than 2 seconds.  Neurological:     General: No focal deficit present.     Mental Status: She is alert.     ED Results / Procedures / Treatments   Labs (all labs ordered are listed, but only abnormal results are displayed) Labs Reviewed  RESP PANEL BY RT-PCR (RSV, FLU A&B, COVID)  RVPGX2 - Abnormal; Notable for the following components:      Result Value   Influenza A by PCR POSITIVE (*)    All other components within normal limits  COMPREHENSIVE METABOLIC PANEL - Abnormal; Notable for the following components:   Calcium 8.8 (*)    Alkaline Phosphatase 32 (*)    All other components within normal limits  CBC - Abnormal; Notable for the following components:   RBC 3.35 (*)    Hemoglobin 5.5 (*)    HCT 21.6 (*)    MCV 64.5 (*)    MCH 16.4 (*)    MCHC 25.5 (*)    RDW 20.6 (*)    All other components within normal limits  LIPASE, BLOOD  AMMONIA  I-STAT BETA HCG BLOOD, ED ([facility], [facility], [facility] ONLY)  TYPE AND SCREEN  PREPARE RBC (CROSSMATCH)    EKG EKG Interpretation  Date/Time:  Tuesday July 30 2022 15:00:29 EST Ventricular Rate:  120 PR Interval:  164 QRS Duration: 78 QT Interval:  300 QTC Calculation: 424 R Axis:   20 Text Interpretation: Sinus tachycardia Nonspecific T wave abnormality Abnormal ECG When compared with ECG of 30-May-2022 09:55, increased rate now Confirmed by Aletta Edouard [phone]) on 07/30/2022 5:34:17 PM  Radiology DG Chest 1 View  Result Date: 07/30/2022 CLINICAL DATA:  Shortness of breath.  Chest pain. EXAM: CHEST  1 VIEW COMPARISON:  Chest radiograph dated Jan 09, 2018 FINDINGS:  The heart is enlarged. Chronic elevation of the left hemidiaphragm. Lungs are otherwise clear without evidence of focal consolidation or pleural effusion. Low lung volumes. No acute osseous abnormality. IMPRESSION: Cardiomegaly and low lung volumes. No acute cardiopulmonary process. Electronically Signed   By: Keane Police D.O.   On: 07/30/2022 15:40    Procedures Procedures  {Document cardiac monitor, telemetry assessment procedure when appropriate:1}  Medications Ordered in ED Medications  0.9 %  sodium chloride infusion (Manually program via Guardrails IV Fluids) (has no administration in  time range)  ondansetron (ZOFRAN-ODT) disintegrating tablet 4 mg (4 mg Oral Given 07/30/22 1417)  acetaminophen (TYLENOL) tablet 1,000 mg (1,000 mg Oral Given 07/30/22 1417)  hydrOXYzine (ATARAX) tablet 25 mg (25 mg Oral Given 07/30/22 1519)    ED Course/ Medical Decision Making/ A&P                           Medical Decision Making Amount and/or Complexity of Data Reviewed Labs: ordered.  Risk OTC drugs. Prescription drug management.   This patient complains of ***; this involves an extensive number of treatment Options and is a complaint that carries with it a high risk of complications and morbidity. The differential includes ***  I ordered, reviewed and interpreted labs, which included *** I ordered medication *** and reviewed PMP when indicated. I ordered imaging studies which included *** and I independently    visualized and interpreted imaging which showed *** Additional history obtained from *** Previous records obtained and reviewed *** I consulted *** and discussed lab and imaging findings and discussed disposition.  Cardiac monitoring reviewed, *** Social determinants considered, *** Critical Interventions: ***  After the interventions stated above, I reevaluated the patient and found *** Admission and further testing considered, ***   {Document critical care time when  appropriate:1} {Document review of labs and clinical decision tools ie heart score, Chads2Vasc2 etc:1}  {Document your independent review of radiology images, and any outside records:1} {Document your discussion with family members, caretakers, and with consultants:1} {Document social determinants of health affecting pt's care:1} {Document your decision making why or why not admission, treatments were needed:1} Final Clinical Impression(s) / ED Diagnoses Final diagnoses:  None    Rx / DC Orders ED Discharge Orders     None

## 2022-07-30 NOTE — Assessment & Plan Note (Addendum)
-  Secondary to heavy menstrual cycle and history of uterine fibroids -Microcytic anemia with hemoglobin of 5.5 on presentation.  Remote prior hemoglobin around 10. -will transfuse 2u pRBC and follow post H/H -Transfuse for hemoglobin <7 -check iron panel

## 2022-07-31 DIAGNOSIS — D500 Iron deficiency anemia secondary to blood loss (chronic): Secondary | ICD-10-CM | POA: Diagnosis not present

## 2022-07-31 DIAGNOSIS — I5022 Chronic systolic (congestive) heart failure: Secondary | ICD-10-CM | POA: Diagnosis not present

## 2022-07-31 DIAGNOSIS — I10 Essential (primary) hypertension: Secondary | ICD-10-CM | POA: Diagnosis not present

## 2022-07-31 DIAGNOSIS — Z6841 Body Mass Index (BMI) 40.0 and over, adult: Secondary | ICD-10-CM

## 2022-07-31 LAB — CBC
HCT: 23.4 % — ABNORMAL LOW (ref 36.0–46.0)
Hemoglobin: 6.4 g/dL — CL (ref 12.0–15.0)
MCH: 18.9 pg — ABNORMAL LOW (ref 26.0–34.0)
MCHC: 27.4 g/dL — ABNORMAL LOW (ref 30.0–36.0)
MCV: 69 fL — ABNORMAL LOW (ref 80.0–100.0)
Platelets: 217 10*3/uL (ref 150–400)
RBC: 3.39 MIL/uL — ABNORMAL LOW (ref 3.87–5.11)
RDW: 22.7 % — ABNORMAL HIGH (ref 11.5–15.5)
WBC: 3.7 10*3/uL — ABNORMAL LOW (ref 4.0–10.5)
nRBC: 0 % (ref 0.0–0.2)

## 2022-07-31 LAB — IRON AND TIBC
Iron: 34 ug/dL (ref 28–170)
Saturation Ratios: 9 % — ABNORMAL LOW (ref 10.4–31.8)
TIBC: 396 ug/dL (ref 250–450)
UIBC: 362 ug/dL

## 2022-07-31 LAB — HEMOGLOBIN AND HEMATOCRIT, BLOOD
HCT: 26.3 % — ABNORMAL LOW (ref 36.0–46.0)
Hemoglobin: 7.4 g/dL — ABNORMAL LOW (ref 12.0–15.0)

## 2022-07-31 LAB — PREPARE RBC (CROSSMATCH)

## 2022-07-31 MED ORDER — SODIUM CHLORIDE 0.9% IV SOLUTION
Freq: Once | INTRAVENOUS | Status: AC
  Administered 2022-07-31: 30 mL/h via INTRAVENOUS

## 2022-07-31 MED ORDER — CARVEDILOL 12.5 MG PO TABS
12.5000 mg | ORAL_TABLET | Freq: Two times a day (BID) | ORAL | Status: DC
  Administered 2022-07-31 (×2): 12.5 mg via ORAL
  Filled 2022-07-31 (×2): qty 1

## 2022-07-31 MED ORDER — FERROUS SULFATE 325 (65 FE) MG PO TABS
325.0000 mg | ORAL_TABLET | Freq: Two times a day (BID) | ORAL | Status: DC
  Administered 2022-07-31 (×2): 325 mg via ORAL
  Filled 2022-07-31 (×2): qty 1

## 2022-07-31 MED ORDER — FERROUS SULFATE 325 (65 FE) MG PO TABS: 325.0000 mg | ORAL_TABLET | Freq: Two times a day (BID) | ORAL | 3 refills | Status: AC

## 2022-07-31 MED ORDER — SPIRONOLACTONE 25 MG PO TABS
25.0000 mg | ORAL_TABLET | Freq: Every day | ORAL | Status: DC
  Filled 2022-07-31: qty 1

## 2022-07-31 MED ORDER — FUROSEMIDE 10 MG/ML IJ SOLN
40.0000 mg | Freq: Once | INTRAMUSCULAR | Status: AC
  Administered 2022-07-31: 40 mg via INTRAVENOUS
  Filled 2022-07-31: qty 4

## 2022-07-31 MED ORDER — OSELTAMIVIR PHOSPHATE 75 MG PO CAPS: 75.0000 mg | ORAL_CAPSULE | Freq: Two times a day (BID) | ORAL | 0 refills | Status: DC

## 2022-07-31 MED ORDER — SODIUM CHLORIDE 0.9 % IV SOLN
125.0000 mg | Freq: Every day | INTRAVENOUS | Status: DC
  Administered 2022-07-31: 125 mg via INTRAVENOUS
  Filled 2022-07-31: qty 10

## 2022-07-31 MED ORDER — ONDANSETRON HCL 4 MG PO TABS: 4.0000 mg | ORAL_TABLET | Freq: Every day | ORAL | 1 refills | Status: DC | PRN

## 2022-07-31 MED ORDER — GUAIFENESIN-CODEINE 100-10 MG/5ML PO SYRP: 5.0000 mL | ORAL_SOLUTION | Freq: Three times a day (TID) | ORAL | 0 refills | Status: DC | PRN

## 2022-07-31 MED ORDER — GUAIFENESIN-DM 100-10 MG/5ML PO SYRP: 5.0000 mL | ORAL_SOLUTION | ORAL | Status: DC | PRN

## 2022-07-31 MED ORDER — DOCUSATE SODIUM 100 MG PO CAPS: 100.0000 mg | ORAL_CAPSULE | Freq: Every day | ORAL | 2 refills | Status: DC | PRN

## 2022-07-31 MED ORDER — DAPAGLIFLOZIN PROPANEDIOL 10 MG PO TABS
10.0000 mg | ORAL_TABLET | Freq: Every day | ORAL | Status: DC
  Administered 2022-07-31: 10 mg via ORAL
  Filled 2022-07-31 (×2): qty 1

## 2022-07-31 MED ORDER — SACUBITRIL-VALSARTAN 97-103 MG PO TABS
1.0000 | ORAL_TABLET | Freq: Two times a day (BID) | ORAL | Status: DC
  Administered 2022-07-31: 1 via ORAL
  Filled 2022-07-31 (×2): qty 1

## 2022-07-31 NOTE — Hospital Course (Addendum)
Amanda [name] is a 41 y.o. F with MS, MO BMI 49, menometrorrhagia and chronic IDA, and NICM/sCHF most recent EF 40-45% who presented with fever, aches and fatigue.  In the ER, flu+.  Hgb 5.5 g/dL.  Admitted for blood transfusion on Tamiflu.

## 2022-07-31 NOTE — ED Notes (Signed)
Ambulated patient, Spo2 98%RA, HR 97, No SOB, DOE, even stride, steady gait. MD aware

## 2022-07-31 NOTE — Discharge Summary (Signed)
Physician Discharge Summary   Patient: Amanda [name] MRN: [id number] DOB: [birth year]  Admit date:     07/30/2022  Discharge date: 07/31/22  Discharge Physician: Edwin Dada   PCP: [name], PA     Recommendations at discharge:  Follow up with PCP Marilynne Drivers in 1 week Marilynne Drivers:  Please obtain CBC in 1 week Please check iron stores in 4 weeks and refer for iron infusions if not replete Please reconcile HF meds from last Cardiology note, see below     Discharge Diagnoses: Principal Problem:   Anemia Active Problems:   Class 3 severe obesity with serious comorbidity and body mass index (BMI) of 50.0 to 59.9 in adult [facility])   Uterine fibroid   Essential hypertension   Chronic systolic CHF (congestive heart failure) ([place])   Influenza A      Hospital Course: Amanda [name] is a 41 y.o. F with MS, MO BMI 49, menometrorrhagia and chronic IDA, and NICM/sCHF most recent EF 40-45% who presented with fever, aches and fatigue.  In the ER, flu+.  Hgb 5.5 g/dL.  Admitted for blood transfusion on Tamiflu.      * Anemia Due to uterine fibroids.  Reported not taking ferrous sulfate recently.  Iron sats low.  She was transfused 3 units, Hgb increased to 7.4 g/dL and patient ambulated without symptoms, felt safe for discharge.  Given IV iron once here.  Discharged with oral iron BID and recommended close interval repeat CBC.    Influenza A CXR clear.  Ambulated on room air without symptoms.    Prescribed Tamiflu for 5 days, recommend PCP follow up.    Chronic systolic CHF (congestive heart failure) (HCC) EF of 40-45% on last echocardiogram Oct 2023.  Patient reported not taking Farxiga, Coreg or spironolactone.  PCP please reconcile medications recommended at last HF clinic appointment:  Entresto 97/103 bid Coreg 12.5 mg twice a day spironolactone 25 daily Dapagliflozin/Farxiga 10 mg daily             The [facility] Controlled Substances  Registry was reviewed for this patient prior to discharge.  Consultants: None Procedures performed: None  Disposition: Home Diet recommendation:    DISCHARGE MEDICATION: Allergies as of 07/31/2022   No Known Allergies      Medication List     STOP taking these medications    nirmatrelvir/ritonavir 20 x 150 MG & 10 x '100MG'$  Tabs Commonly known as: PAXLOVID       TAKE these medications    acetaminophen 500 MG tablet Commonly known as: TYLENOL Take 1,000 mg by mouth 2 (two) times daily as needed for mild pain, moderate pain, fever or headache.   carvedilol 12.5 MG tablet Commonly known as: COREG Take 1 tablet (12.5 mg total) by mouth 2 (two) times daily with a meal.   dapagliflozin propanediol 10 MG Tabs tablet Commonly known as: Farxiga Take 1 tablet (10 mg total) by mouth daily before breakfast.   docusate sodium 100 MG capsule Commonly known as: Colace Take 1 capsule (100 mg total) by mouth daily as needed.   Entresto 97-103 MG Generic drug: sacubitril-valsartan Take 1 tablet by mouth 2 (two) times daily.   ferrous sulfate 325 (65 FE) MG tablet Take 1 tablet (325 mg total) by mouth 2 (two) times daily with a meal.   guaiFENesin-codeine 100-10 MG/5ML syrup Commonly known as: ROBITUSSIN AC Take 5 mLs by mouth 3 (three) times daily as needed for cough.   ondansetron 4 MG  tablet Commonly known as: Zofran Take 1 tablet (4 mg total) by mouth daily as needed for nausea or vomiting.   oseltamivir 75 MG capsule Commonly known as: TAMIFLU Take 1 capsule (75 mg total) by mouth 2 (two) times daily.   spironolactone 25 MG tablet Commonly known as: ALDACTONE Take 1 tablet (25 mg total) by mouth at bedtime.   VITAMIN D-3 PO Take 1 capsule by mouth in the morning.        Follow-up Information     [name], PA. Schedule an appointment as soon as possible for a visit in 1 week(s).   Specialty: Family Medicine Contact information: [place] [address] [phone]                 Discharge Instructions     Diet - low sodium heart healthy   Complete by: As directed    Discharge instructions   Complete by: As directed    **IMPORTANT DISCHARGE INSTRUCTIONS**   From Dr. Loleta Books: You were admitted for anemia Here, you came in for fatigue and feeling out of breath Here, we found that you had the flu and also anemia  Your hemoglobin here was 5.5 g/dL  You were transfused 3 units of blood and an iron infusion and your hemoglobin improved and symptoms improved.  Likely this anemia is from your fibroids.  You should take iron (ferrous sulfate 325 mg) twice daily Take it with the stool softener docusate (Colace) Take docusate once or twice daily You can use the generic or brand version, either is fine  You can take the iron supplement with something acidic like orange juice to make it work better  Go see your primary doctor in 1 week to make sure you are doing okay, make sure she checks your blood level next week  For the flu: Take oseltamivir/Tamiflu 75 mg twice daily for the next 4 days  Take ondansetron (Zofran) if you need a nausea medicine You can take this up to every 6 hours if needed  Lastly, you can use Cheratussin for cough suppressant.  This is a narcotic cough syrup, so keep it away from children and don't drive while taking it  To PREVENT the flu, your husband can take oseltamivir/Tamiflu at half dose (75 mg once daily) for 5 days Both of you should wash your hand frequently to avoid spread as well   Increase activity slowly   Complete by: As directed        Discharge Exam: There were no vitals filed for this visit.  General: Pt is alert, awake, not in acute distress Cardiovascular: RRR, nl S1-S2, no murmurs appreciated.   No LE edema.   Respiratory: Normal respiratory rate and rhythm.  CTAB without rales or wheezes. Abdominal: Abdomen soft and non-tender.  No distension or  HSM.   Neuro/Psych: Strength symmetric in upper and lower extremities.  Judgment and insight appear normal.   Condition at discharge: good  The results of significant diagnostics from this hospitalization (including imaging, microbiology, ancillary and laboratory) are listed below for reference.   Imaging Studies: DG Chest 1 View  Result Date: 07/30/2022 CLINICAL DATA:  Shortness of breath.  Chest pain. EXAM: CHEST  1 VIEW COMPARISON:  Chest radiograph dated Jan 09, 2018 FINDINGS: The heart is enlarged. Chronic elevation of the left hemidiaphragm. Lungs are otherwise clear without evidence of focal consolidation or pleural effusion. Low lung volumes. No acute osseous abnormality. IMPRESSION: Cardiomegaly and  low lung volumes. No acute cardiopulmonary process. Electronically Signed   By: Keane Police D.O.   On: 07/30/2022 15:40    Microbiology: Results for orders placed or performed during the hospital encounter of 07/30/22  Resp panel by RT-PCR (RSV, Flu A&B, Covid) Anterior Nasal Swab     Status: Abnormal   Collection Time: 07/30/22  2:07 PM   Specimen: Anterior Nasal Swab  Result Value Ref Range Status   SARS Coronavirus 2 by RT PCR NEGATIVE NEGATIVE Final    Comment: (NOTE) SARS-CoV-2 target nucleic acids are NOT DETECTED.  The SARS-CoV-2 RNA is generally detectable in upper respiratory specimens during the acute phase of infection. The lowest concentration of SARS-CoV-2 viral copies this assay can detect is 138 copies/mL. A negative result does not preclude SARS-Cov-2 infection and should not be used as the sole basis for treatment or other patient management decisions. A negative result may occur with  improper specimen collection/handling, submission of specimen other than nasopharyngeal swab, presence of viral mutation(s) within the areas targeted by this assay, and inadequate number of viral copies(<138 copies/mL). A negative result must be combined with clinical  observations, patient history, and epidemiological information. The expected result is Negative.  Fact Sheet for Patients:  EntrepreneurPulse.com.au  Fact Sheet for Healthcare Providers:  IncredibleEmployment.be  This test is no t yet approved or cleared by the [place] FDA and  has been authorized for detection and/or diagnosis of SARS-CoV-2 by FDA under an Emergency Use Authorization (EUA). This EUA will remain  in effect (meaning this test can be used) for the duration of the COVID-19 declaration under Section 564(b)(1) of the Act, 21 U.S.C.section 360bbb-3(b)(1), unless the authorization is terminated  or revoked sooner.       Influenza A by PCR POSITIVE (A) NEGATIVE Final   Influenza B by PCR NEGATIVE NEGATIVE Final    Comment: (NOTE) The Xpert Xpress SARS-CoV-2/FLU/RSV plus assay is intended as an aid in the diagnosis of influenza from Nasopharyngeal swab specimens and should not be used as a sole basis for treatment. Nasal washings and aspirates are unacceptable for Xpert Xpress SARS-CoV-2/FLU/RSV testing.  Fact Sheet for Patients: EntrepreneurPulse.com.au  Fact Sheet for Healthcare Providers: IncredibleEmployment.be  This test is not yet approved or cleared by the [place] FDA and has been authorized for detection and/or diagnosis of SARS-CoV-2 by FDA under an Emergency Use Authorization (EUA). This EUA will remain in effect (meaning this test can be used) for the duration of the COVID-19 declaration under Section 564(b)(1) of the Act, 21 U.S.C. section 360bbb-3(b)(1), unless the authorization is terminated or revoked.     Resp Syncytial Virus by PCR NEGATIVE NEGATIVE Final    Comment: (NOTE) Fact Sheet for Patients: EntrepreneurPulse.com.au  Fact Sheet for Healthcare Providers: IncredibleEmployment.be  This test is not yet approved or cleared  by the [place] FDA and has been authorized for detection and/or diagnosis of SARS-CoV-2 by FDA under an Emergency Use Authorization (EUA). This EUA will remain in effect (meaning this test can be used) for the duration of the COVID-19 declaration under Section 564(b)(1) of the Act, 21 U.S.C. section 360bbb-3(b)(1), unless the authorization is terminated or revoked.  Performed at [facility], [address], [place], [place] [address]     Labs: CBC: Recent Labs  Lab 07/30/22 1414 07/31/22 0500 07/31/22 1706  WBC 5.0 3.7*  --   HGB 5.5* 6.4* 7.4*  HCT 21.6* 23.4* 26.3*  MCV 64.5* 69.0*  --   PLT  273 217  --    Basic Metabolic Panel: Recent Labs  Lab 07/30/22 1414  NA 137  K 3.6  CL 108  CO2 24  GLUCOSE 98  BUN 6  CREATININE 0.69  CALCIUM 8.8*   Liver Function Tests: Recent Labs  Lab 07/30/22 1414  AST 21  ALT 12  ALKPHOS 32*  BILITOT 0.7  PROT 7.3  ALBUMIN 3.5   CBG: No results for input(s): "GLUCAP" in the last 168 hours.  Discharge time spent: approximately 35 minutes spent on discharge counseling, evaluation of patient on day of discharge, and coordination of discharge planning with nursing, social work, pharmacy and case management  Signed: Edwin Dada, MD [facility] 07/31/2022

## 2022-08-01 LAB — TYPE AND SCREEN
ABO/RH(D): A POS
Antibody Screen: NEGATIVE
Unit division: 0
Unit division: 0
Unit division: 0

## 2022-08-01 LAB — BPAM RBC
Blood Product Expiration Date: 202401132359
Blood Product Expiration Date: 202401142359
Blood Product Expiration Date: 202401162359
ISSUE DATE / TIME: 202312191840
ISSUE DATE / TIME: 202312192203
ISSUE DATE / TIME: 202312201044
Unit Type and Rh: 6200
Unit Type and Rh: 6200
Unit Type and Rh: 6200

## 2023-02-17 ENCOUNTER — Other Ambulatory Visit (HOSPITAL_COMMUNITY): Payer: Self-pay | Admitting: *Deleted

## 2023-02-17 MED ORDER — ENTRESTO 97-103 MG PO TABS: 1.0000 | ORAL_TABLET | Freq: Two times a day (BID) | ORAL | 3 refills | Status: DC

## 2023-02-17 MED ORDER — SPIRONOLACTONE 25 MG PO TABS: 25.0000 mg | ORAL_TABLET | Freq: Every day | ORAL | 3 refills | Status: DC

## 2023-02-17 MED ORDER — CARVEDILOL 12.5 MG PO TABS: 12.5000 mg | ORAL_TABLET | Freq: Two times a day (BID) | ORAL | 3 refills | Status: DC

## 2023-03-12 ENCOUNTER — Encounter (HOSPITAL_COMMUNITY): Payer: Commercial Managed Care - HMO | Admitting: Cardiology

## 2023-03-28 ENCOUNTER — Ambulatory Visit (HOSPITAL_COMMUNITY)
Admission: RE | Admit: 2023-03-28 | Discharge: 2023-03-28 | Disposition: A | Discharge status: Home / Self Care | Payer: Commercial Managed Care - HMO | Source: Ambulatory Visit | Attending: Cardiology | Admitting: Cardiology

## 2023-03-28 ENCOUNTER — Encounter (HOSPITAL_COMMUNITY): Payer: Self-pay | Admitting: Cardiology

## 2023-03-28 VITALS — BP 128/88 | HR 85 | Wt 330.6 lb

## 2023-03-28 DIAGNOSIS — D500 Iron deficiency anemia secondary to blood loss (chronic): Secondary | ICD-10-CM | POA: Insufficient documentation

## 2023-03-28 DIAGNOSIS — D259 Leiomyoma of uterus, unspecified: Secondary | ICD-10-CM | POA: Insufficient documentation

## 2023-03-28 DIAGNOSIS — Z9189 Other specified personal risk factors, not elsewhere classified: Secondary | ICD-10-CM | POA: Diagnosis not present

## 2023-03-28 DIAGNOSIS — G35 Multiple sclerosis: Secondary | ICD-10-CM | POA: Insufficient documentation

## 2023-03-28 DIAGNOSIS — I5022 Chronic systolic (congestive) heart failure: Secondary | ICD-10-CM | POA: Insufficient documentation

## 2023-03-28 DIAGNOSIS — I428 Other cardiomyopathies: Secondary | ICD-10-CM | POA: Diagnosis not present

## 2023-03-28 DIAGNOSIS — Z6841 Body Mass Index (BMI) 40.0 and over, adult: Secondary | ICD-10-CM | POA: Insufficient documentation

## 2023-03-28 DIAGNOSIS — E669 Obesity, unspecified: Secondary | ICD-10-CM | POA: Diagnosis not present

## 2023-03-28 DIAGNOSIS — R9431 Abnormal electrocardiogram [ECG] [EKG]: Secondary | ICD-10-CM | POA: Insufficient documentation

## 2023-03-28 LAB — CBC
HCT: 36.9 % (ref 36.0–46.0)
Hemoglobin: 11.6 g/dL — ABNORMAL LOW (ref 12.0–15.0)
MCH: 27.6 pg (ref 26.0–34.0)
MCHC: 31.4 g/dL (ref 30.0–36.0)
MCV: 87.9 fL (ref 80.0–100.0)
Platelets: 211 10*3/uL (ref 150–400)
RBC: 4.2 MIL/uL (ref 3.87–5.11)
RDW: 13.9 % (ref 11.5–15.5)
WBC: 4.4 10*3/uL (ref 4.0–10.5)
nRBC: 0 % (ref 0.0–0.2)

## 2023-03-28 LAB — BASIC METABOLIC PANEL
Anion gap: 8 (ref 5–15)
BUN: 8 mg/dL (ref 6–20)
CO2: 24 mmol/L (ref 22–32)
Calcium: 8.9 mg/dL (ref 8.9–10.3)
Chloride: 105 mmol/L (ref 98–111)
Creatinine, Ser: 0.71 mg/dL (ref 0.44–1.00)
GFR, Estimated: >60 mL/min (ref >60)
Glucose, Bld: 124 mg/dL — ABNORMAL HIGH (ref 70–99)
Potassium: 4.3 mmol/L (ref 3.5–5.1)
Sodium: 137 mmol/L (ref 135–145)

## 2023-03-28 LAB — HEMOGLOBIN A1C
Hgb A1c MFr Bld: 5.4 % (ref 4.8–5.6)
Mean Plasma Glucose: 108.28 mg/dL

## 2023-03-28 LAB — IRON AND TIBC
Iron: 30 ug/dL (ref 28–170)
Saturation Ratios: 8 % — ABNORMAL LOW (ref 10.4–31.8)
TIBC: 379 ug/dL (ref 250–450)
UIBC: 349 ug/dL

## 2023-03-28 LAB — BRAIN NATRIURETIC PEPTIDE: B Natriuretic Peptide: 48 pg/mL (ref 0.0–100.0)

## 2023-03-28 LAB — FERRITIN: Ferritin: 10 ng/mL — ABNORMAL LOW (ref 11–307)

## 2023-03-28 MED ORDER — DAPAGLIFLOZIN PROPANEDIOL 10 MG PO TABS: 10.0000 mg | ORAL_TABLET | Freq: Every day | ORAL | 11 refills | Status: DC

## 2023-03-28 NOTE — Patient Instructions (Signed)
RESTART Farxiga 10 mg daily.  Labs done today, your results will be available in MyChart, we will contact you for abnormal readings.  Repeat blood work in 10 days.  Your physician has requested that you have an echocardiogram. Echocardiography is a painless test that uses sound waves to create images of your heart. It provides your doctor with information about the size and shape of your heart and how well your heart's chambers and valves are working. This procedure takes approximately one hour. There are no restrictions for this procedure. Please do NOT wear cologne, perfume, aftershave, or lotions (deodorant is allowed). Please arrive 15 minutes prior to your appointment time.  You have been referred to the [facility] for [place]. They will call you to arrange your appointment.  Your physician recommends that you schedule a follow-up appointment in: 3 months with your echocardiogram   If you have any questions or concerns before your next appointment please send [place] a message through [place] or call our office at [phone].    TO LEAVE A MESSAGE FOR THE NURSE SELECT OPTION 2, PLEASE LEAVE A MESSAGE INCLUDING: YOUR NAME DATE OF BIRTH CALL BACK NUMBER REASON FOR CALL**this is important as we prioritize the call backs  YOU WILL RECEIVE A CALL BACK THE SAME DAY AS LONG AS YOU CALL BEFORE 4:00 PM  At the [facility], you and your health needs are our priority. As part of our continuing mission to provide you with exceptional heart care, we have created designated Provider Care Teams. These Care Teams include your primary Cardiologist (physician) and Advanced Practice Providers (APPs- Physician Assistants and Nurse Practitioners) who all work together to provide you with the care you need, when you need it.   You may see any of the following providers on your designated Care Team at your next follow up: Dr Arvilla Meres Dr Marca Ancona Dr. Marcos Eke, NP Robbie Lis, [place] [facility], [place] Brynda Peon, NP Karle Plumber, PharmD   Please be sure to bring in all your medications bottles to every appointment.    Thank you for choosing [facility]

## 2023-03-29 NOTE — Progress Notes (Signed)
ID:  SONNI VANHOOSIER, DOB 06-18-1981, MRN [id number]   Provider location: [place] [facility] Type of Visit: Established patient   PCP:  Wilfrid Lund, PA  Cardiologist:  Dr. Shirlee Latch.    History of Present Illness: Yanisa L Steurer is a 42 y.o. who has a history of multiple sclerosis, anemia due to uterine fibroids, and systolic HF (dx 12/2017).   Admitted to [facility] 5/31-01/13/18  with volume overload and found to have acute systolic heart failure. Echo showed EF 10-15%. HF team consulted. R/LHC completed, which showed no CAD and preserved CO. Cardiac MRI showed no LGE, EF 14%. She was diuresed with IV lasix and transitioned to lasix 40 mg daily. HF meds were optimized. DC weight: 299 lbs.   Echo in 11/18 showed EF 35% with diffuse hypokinesis.    She has been unable to tolerate even low dose Bidil.   Echo in 4/20 showed EF 40%, mild LVH, normal RV, normal IVC.   Echo in 5/21 showed EF 45%, normal RV.   Echo in 5/22 showed EF 40%, diffuse hypokinesis, normal RV, mild MR.   Echo in 10/23 showed EF 40-45%, global hypokinesis, normal RV.   She returns today for followup of CHF.  She is short of breath walking long distances or lifting heavy objects.  Sometimes gets short of breath in the shower.  No orthopnea/PND.  No chest pain.  Occasional lightheadedness if she stands too fast.  Weight has trended up, she thinks this is dietary. Still with episodes of vaginal bleeding from uterine fibroids.  Hysterectomy has been recommended.   ECG (personally reviewed): NSR, LVH  Labs (6/19): K 4.3, creatinine 0.88 Labs (10/19): K 3.4, creatinine 0.8 Labs (11/19): K 4.1, creatinine 0.74 Labs (4/20): K 4.1, creatinine 0.78 Labs (7/20): K 4.3, creatinine 0.68 Labs (12/20): K 3.7, creatinine 0.74 Labs (2/21): K 4, creatinine 0.64 Labs (6/21): TSH normal, LDL 94, K 4.4, creatinine 7.10          Labs (2/22): K 3.9, creatinine 0.65, hgb 9.3      Labs (10/22): creatinine 0.65   Labs (5/23): K  3.8, creatinine 0.68 Labs (12/23): K 3.6, creatinine 0.69, hgb 7.4   PMH:  1. Multiple sclerosis 2. Uterine fibroids 3. Fe deficiency anemia: Likely related to fibroids.  4. Chronic systolic CHF: Diagnosed in 5/19.  Nonischemic cardiomyopathy.  - Echo (6/19) with EF 10-15%.  - RHC/LHC (6/19): mean RA 5, PA 30/13 mean 22, mean PCWP 9, CI 2.63.  No angiographic CAD.  - Cardiac MRI (6/19): EF 14% with severely dilated LV, mildly dilated RV with mildly decreased systolic function, no myocardial LGE.  - Echo (8/19): EF 30%, diffuse hypokinesis, mildly dilated RV with normal systolic function, PASP 40 mmHg.  - Echo (11/19): EF 35%, diffuse hypokinesis, mildly dilated RV with normal systolic function.  - Echo (4/20): EF 40% with mild LV dilation and mild LVH, normal RV, normal IVC.  - Echo (5/21): EF 45%, mild LVH, normal RV, normal IVC - Echo (5/22): EF 40%, diffuse hypokinesis, normal RV, mild MR. - Echo (10/23): EF 40-45%, global hypokinesis, normal RV.  5. Sleep study (9/19): No OSA.  6. BPPV 7. COVID-19 12/22  Past Surgical History:  Procedure Laterality Date   CARDIAC CATHETERIZATION  01/12/2018     Current Outpatient Medications  Medication Sig Dispense Refill   acetaminophen (TYLENOL) 500 MG tablet Take 1,000 mg by mouth 2 (two) times daily as needed for mild pain, moderate pain, fever or  headache.     carvedilol (COREG) 12.5 MG tablet Take 1 tablet (12.5 mg total) by mouth 2 (two) times daily with a meal. 180 tablet 3   Cholecalciferol (VITAMIN D-3 PO) Take 1 capsule by mouth in the morning.     ferrous sulfate 325 (65 FE) MG tablet Take 1 tablet (325 mg total) by mouth 2 (two) times daily with a meal. 60 tablet 3   sacubitril-valsartan (ENTRESTO) 97-103 MG Take 1 tablet by mouth 2 (two) times daily. 180 tablet 3   spironolactone (ALDACTONE) 25 MG tablet Take 1 tablet (25 mg total) by mouth at bedtime. 90 tablet 3   dapagliflozin propanediol (FARXIGA) 10 MG TABS tablet Take 1  tablet (10 mg total) by mouth daily before breakfast. 30 tablet 11   No current facility-administered medications for this encounter.    Allergies:   Patient has no known allergies.   Social History:  The patient  reports that she has never smoked. She has never used smokeless tobacco. She reports that she does not drink alcohol and does not use drugs.   Family History:  The patient's family history includes Diabetes in her father and mother; Fibroids in her sister; Heart disease in her mother; Hypertension in her mother; Obesity in her mother; Stroke in her mother.   ROS:  Please see the history of present illness.   All other systems are personally reviewed and negative.   Exam:   BP 128/88   Pulse 85   Wt (!) 150 kg (330 lb 9.6 oz)   SpO2 98%   BMI 51.78 kg/m  General: NAD, obese.  Neck: No JVD, no thyromegaly or thyroid nodule.  Lungs: Clear to auscultation bilaterally with normal respiratory effort. CV: Nondisplaced PMI.  Heart regular S1/S2, no S3/S4, no murmur.  No peripheral edema.  No carotid bruit.  Normal pedal pulses.  Abdomen: Soft, nontender, no hepatosplenomegaly, no distention.  Skin: Intact without lesions or rashes.  Neurologic: Alert and oriented x 3.  Psych: Normal affect. Extremities: No clubbing or cyanosis.  HEENT: Normal.   Recent Labs: 07/30/2022: ALT 12 03/28/2023: B Natriuretic Peptide 48.0; BUN 8; Creatinine, Ser 0.71; Hemoglobin 11.6; Platelets 211; Potassium 4.3; Sodium 137  Personally reviewed   Wt Readings from Last 3 Encounters:  03/28/23 (!) 150 kg (330 lb 9.6 oz)  05/30/22 (!) 144.5 kg (318 lb 9.6 oz)  01/01/22 (!) 147.2 kg (324 lb 9.6 oz)     ASSESSMENT AND PLAN:  1. Chronic systolic CHF: Echo 5/19 with EF 10-15%, moderate MR. Nonischemic cardiomyopathy with no coronary disease on 6/19 cath. 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 Mother developed CHF in her 46s but no other family members have CHF that she knows of (has several healthy  siblings).  She does not drink ETOH or use drugs.  No children, has not been pregnant so not peri-partum CMP.  TSH normal.  RHC 01/12/18 with normal filling pressures and preserved CO. Cardiac MRI 01/12/18 did not show LGE, EF 14%. Repeat echo 11/19 showed EF up to 35%.  Echo in 4/20 showed EF 40%.  Echo in 5/21 showed EF up to 45%.  Echo in 5/22 showed EF 40%.  Echo in 10/23 showed EF 40-45%, global hypokinesis, normal RV.   NYHA class II-III symptoms, anemia likely plays a role in dyspnea.  Weight has trended up but she is not volume overloaded on exam.    - Continue Entresto 97/103 bid. BMET/BNP today.  - Continue Coreg 12.5 mg twice  a day.  - Continue spironolactone 25 daily.  - She will start Farxiga 10 mg daily, BMET/BNP today and BMET in 10 days.   - She did not tolerate even low dose Bidil.   - EF was out of ICD range on last echo.  - Repeat echo at followup in 3 months.  2. Obesity: We again discussed weight loss with diet and exercise.   - She would be a good semaglutide candidate => refer to pharmacy clinic to see if she can get this medication (will send hgbA1c today).  3.  Fe deficiency anemia: Due to bleeding from uterine fibroids.  - Check CBC, TIBC, Fe, ferritin today.  Will treat Fe deficiency with IV Fe if present.  - Hysterectomy has been recommended.  She will be moderate risk for anesthesia but not prohibitive.   Followup in 3 months with echo.   Signed, Marca Ancona, MD  03/29/2023  [facility] [address] [facility] [place] [address] [phone] (office) [phone] (fax)

## 2023-03-31 ENCOUNTER — Other Ambulatory Visit (HOSPITAL_COMMUNITY): Payer: Self-pay

## 2023-03-31 DIAGNOSIS — I5022 Chronic systolic (congestive) heart failure: Secondary | ICD-10-CM

## 2023-04-07 ENCOUNTER — Ambulatory Visit (HOSPITAL_COMMUNITY)
Admission: RE | Admit: 2023-04-07 | Discharge: 2023-04-07 | Disposition: A | Discharge status: Home / Self Care | Payer: Commercial Managed Care - HMO | Source: Ambulatory Visit | Attending: Cardiology | Admitting: Cardiology

## 2023-04-07 DIAGNOSIS — I5022 Chronic systolic (congestive) heart failure: Secondary | ICD-10-CM

## 2023-04-07 LAB — BASIC METABOLIC PANEL
Anion gap: 7 (ref 5–15)
BUN: 13 mg/dL (ref 6–20)
CO2: 24 mmol/L (ref 22–32)
Calcium: 9.2 mg/dL (ref 8.9–10.3)
Chloride: 110 mmol/L (ref 98–111)
Creatinine, Ser: 0.71 mg/dL (ref 0.44–1.00)
GFR, Estimated: >60 mL/min (ref >60)
Glucose, Bld: 105 mg/dL — ABNORMAL HIGH (ref 70–99)
Potassium: 3.9 mmol/L (ref 3.5–5.1)
Sodium: 141 mmol/L (ref 135–145)

## 2023-04-16 ENCOUNTER — Inpatient Hospital Stay (HOSPITAL_COMMUNITY): Admission: RE | Admit: 2023-04-16 | Payer: Commercial Managed Care - HMO | Source: Ambulatory Visit

## 2023-04-25 ENCOUNTER — Ambulatory Visit (HOSPITAL_COMMUNITY)
Admission: RE | Admit: 2023-04-25 | Discharge: 2023-04-25 | Disposition: A | Discharge status: Home / Self Care | Payer: Commercial Managed Care - HMO | Source: Ambulatory Visit | Attending: Cardiology | Admitting: Cardiology

## 2023-04-25 DIAGNOSIS — I5022 Chronic systolic (congestive) heart failure: Secondary | ICD-10-CM | POA: Diagnosis present

## 2023-04-25 MED ORDER — SODIUM CHLORIDE 0.9 % IV SOLN
510.0000 mg | Freq: Once | INTRAVENOUS | Status: AC
  Administered 2023-04-25: 510 mg via INTRAVENOUS
  Filled 2023-04-25: qty 510

## 2023-06-24 ENCOUNTER — Other Ambulatory Visit (HOSPITAL_COMMUNITY): Payer: Self-pay | Admitting: Nurse Practitioner

## 2023-06-24 DIAGNOSIS — G35 Multiple sclerosis: Secondary | ICD-10-CM

## 2023-06-27 ENCOUNTER — Telehealth (HOSPITAL_COMMUNITY): Payer: Self-pay

## 2023-06-27 ENCOUNTER — Ambulatory Visit (HOSPITAL_BASED_OUTPATIENT_CLINIC_OR_DEPARTMENT_OTHER)
Admission: RE | Admit: 2023-06-27 | Discharge: 2023-06-27 | Disposition: A | Discharge status: Home / Self Care | Payer: Commercial Managed Care - HMO | Source: Ambulatory Visit | Attending: Cardiology | Admitting: Cardiology

## 2023-06-27 ENCOUNTER — Telehealth (HOSPITAL_COMMUNITY): Payer: Self-pay | Admitting: Pharmacy Technician

## 2023-06-27 ENCOUNTER — Other Ambulatory Visit (HOSPITAL_COMMUNITY): Payer: Self-pay

## 2023-06-27 ENCOUNTER — Ambulatory Visit (HOSPITAL_COMMUNITY)
Admission: RE | Admit: 2023-06-27 | Discharge: 2023-06-27 | Disposition: A | Discharge status: Home / Self Care | Payer: Commercial Managed Care - HMO | Source: Ambulatory Visit | Attending: Cardiology | Admitting: Cardiology

## 2023-06-27 ENCOUNTER — Encounter (HOSPITAL_COMMUNITY): Payer: Self-pay | Admitting: Cardiology

## 2023-06-27 VITALS — BP 110/70 | HR 75 | Wt 331.0 lb

## 2023-06-27 DIAGNOSIS — D500 Iron deficiency anemia secondary to blood loss (chronic): Secondary | ICD-10-CM | POA: Diagnosis not present

## 2023-06-27 DIAGNOSIS — Z8249 Family history of ischemic heart disease and other diseases of the circulatory system: Secondary | ICD-10-CM | POA: Diagnosis not present

## 2023-06-27 DIAGNOSIS — Z79899 Other long term (current) drug therapy: Secondary | ICD-10-CM | POA: Insufficient documentation

## 2023-06-27 DIAGNOSIS — G35 Multiple sclerosis: Secondary | ICD-10-CM | POA: Diagnosis not present

## 2023-06-27 DIAGNOSIS — D259 Leiomyoma of uterus, unspecified: Secondary | ICD-10-CM | POA: Diagnosis not present

## 2023-06-27 DIAGNOSIS — I110 Hypertensive heart disease with heart failure: Secondary | ICD-10-CM | POA: Diagnosis not present

## 2023-06-27 DIAGNOSIS — Z86018 Personal history of other benign neoplasm: Secondary | ICD-10-CM | POA: Insufficient documentation

## 2023-06-27 DIAGNOSIS — I5022 Chronic systolic (congestive) heart failure: Secondary | ICD-10-CM

## 2023-06-27 DIAGNOSIS — Z6841 Body Mass Index (BMI) 40.0 and over, adult: Secondary | ICD-10-CM | POA: Insufficient documentation

## 2023-06-27 DIAGNOSIS — I428 Other cardiomyopathies: Secondary | ICD-10-CM | POA: Diagnosis present

## 2023-06-27 DIAGNOSIS — E669 Obesity, unspecified: Secondary | ICD-10-CM | POA: Diagnosis not present

## 2023-06-27 LAB — BASIC METABOLIC PANEL
Anion gap: 6 (ref 5–15)
BUN: 7 mg/dL (ref 6–20)
CO2: 25 mmol/L (ref 22–32)
Calcium: 9.1 mg/dL (ref 8.9–10.3)
Chloride: 109 mmol/L (ref 98–111)
Creatinine, Ser: 0.65 mg/dL (ref 0.44–1.00)
GFR, Estimated: >60 mL/min (ref >60)
Glucose, Bld: 85 mg/dL (ref 70–99)
Potassium: 4.1 mmol/L (ref 3.5–5.1)
Sodium: 140 mmol/L (ref 135–145)

## 2023-06-27 LAB — ECHOCARDIOGRAM COMPLETE
AR max vel: 2.47 cm2
AV Area VTI: 2.56 cm2
AV Area mean vel: 2.38 cm2
AV Mean grad: 4 mm[Hg]
AV Peak grad: 7.4 mm[Hg]
Ao pk vel: 1.36 m/s
Area-P 1/2: 3.17 cm2
Calc EF: 46.3 %
S' Lateral: 4.7 cm
Single Plane A2C EF: 43.9 %
Single Plane A4C EF: 45.2 %

## 2023-06-27 LAB — IRON AND TIBC
Iron: 26 ug/dL — ABNORMAL LOW (ref 28–170)
Saturation Ratios: 7 % — ABNORMAL LOW (ref 10.4–31.8)
TIBC: 363 ug/dL (ref 250–450)
UIBC: 337 ug/dL

## 2023-06-27 LAB — FERRITIN: Ferritin: 11 ng/mL (ref 11–307)

## 2023-06-27 LAB — BRAIN NATRIURETIC PEPTIDE: B Natriuretic Peptide: 73.4 pg/mL (ref 0.0–100.0)

## 2023-06-27 MED ORDER — FARXIGA 10 MG PO TABS: 10.0000 mg | ORAL_TABLET | Freq: Every day | ORAL | 11 refills | Status: DC

## 2023-06-27 NOTE — Patient Instructions (Signed)
STOP Farxiga   START Jardiance 10 mg daily.  Labs done today, your results will be available in MyChart, we will contact you for abnormal readings.  Repeat blood work in 10 days.  Your physician recommends that you schedule a follow-up appointment in: 3 months.  If you have any questions or concerns before your next appointment please send [place] a message through [place] or call our office at [phone].    TO LEAVE A MESSAGE FOR THE NURSE SELECT OPTION 2, PLEASE LEAVE A MESSAGE INCLUDING: YOUR NAME DATE OF BIRTH CALL BACK NUMBER REASON FOR CALL**this is important as we prioritize the call backs  YOU WILL RECEIVE A CALL BACK THE SAME DAY AS LONG AS YOU CALL BEFORE 4:00 PM  At the [facility], you and your health needs are our priority. As part of our continuing mission to provide you with exceptional heart care, we have created designated Provider Care Teams. These Care Teams include your primary Cardiologist (physician) and Advanced Practice Providers (APPs- Physician Assistants and Nurse Practitioners) who all work together to provide you with the care you need, when you need it.   You may see any of the following providers on your designated Care Team at your next follow up: Dr Arvilla Meres Dr Marca Ancona Dr. Dorthula Nettles Dr. Clearnce Hasten Amy Filbert Schilder, NP Robbie Lis, [place] [facility] [place], [place] Brynda Peon, NP [place] Lee, NP Karle Plumber, PharmD   Please be sure to bring in all your medications bottles to every appointment.    Thank you for choosing [place] [facility]

## 2023-06-27 NOTE — Telephone Encounter (Addendum)
Spoke with patient, she wishes to wait till after her MRI ( scheduled for December )to have the infusion, radiology told her MRI has to be 3 months after last infusion, which was in September .   ----- Message from Marca Ancona sent at 06/27/2023  2:45 PM EST ----- Low transferrin saturation, would arrange for IV iron infusion.

## 2023-06-27 NOTE — Telephone Encounter (Signed)
Advanced [facility] Failure Patient Advocate Encounter  Prior Authorization for [place] has been approved.    PA# [id number] Effective dates: 06/27/23 through 06/26/24  Patients co-pay is $0. Called and spoke with the patient.   Archer Asa, CPhT

## 2023-06-27 NOTE — Telephone Encounter (Signed)
Patient Advocate Encounter   Received notification from [employer] that prior authorization for [place] Pepper is required.   PA submitted on CoverMyMeds [facility] Status is pending   Will continue to follow.

## 2023-06-29 NOTE — Progress Notes (Signed)
ID:  Amanda [name], DOB 23-Mar-1981, MRN [id number]   Provider location: [place] [facility] Type of Visit: Established patient   PCP:  Wilfrid Lund, PA  Cardiologist:  Dr. Shirlee Latch.    History of Present Illness: Amanda [name] is a 42 y.o. who has a history of multiple sclerosis, anemia due to uterine fibroids, and systolic HF (dx 12/2017).   Admitted to [facility] 5/31-01/13/18  with volume overload and found to have acute systolic heart failure. Echo showed EF 10-15%. HF team consulted. R/LHC completed, which showed no CAD and preserved CO. Cardiac MRI showed no LGE, EF 14%. She was diuresed with IV lasix and transitioned to lasix 40 mg daily. HF meds were optimized. DC weight: 299 lbs.   Echo in 11/18 showed EF 35% with diffuse hypokinesis.    She has been unable to tolerate even low dose Bidil.   Echo in 4/20 showed EF 40%, mild LVH, normal RV, normal IVC.   Echo in 5/21 showed EF 45%, normal RV.   Echo in 5/22 showed EF 40%, diffuse hypokinesis, normal RV, mild MR.   Echo in 10/23 showed EF 40-45%, global hypokinesis, normal RV.   Echo was done today and reviewed, EF 35-40%, global hypokinesis, RV normal, IVC normal.   She returns today for followup of CHF.  Weight is stable.  Having more symptoms from MS, has left arm and right leg weakness as well as muscle spasms.  She took [place] for about a month but said it made her feel "weak" so she stopped it.  No significant exertional dyspnea.  No chest pain.  No orthopnea/PND.  No lightheadedness or palpitations.   ECG (personally reviewed): NSR, nonspecific T wave abnormalities  Labs (6/19): K 4.3, creatinine 0.88 Labs (10/19): K 3.4, creatinine 0.8 Labs (11/19): K 4.1, creatinine 0.74 Labs (4/20): K 4.1, creatinine 0.78 Labs (7/20): K 4.3, creatinine 0.68 Labs (12/20): K 3.7, creatinine 0.74 Labs (2/21): K 4, creatinine 0.64 Labs (6/21): TSH normal, LDL 94, K 4.4, creatinine 6.96          Labs (2/22): K 3.9,  creatinine 0.65, hgb 9.3      Labs (10/22): creatinine 0.65   Labs (5/23): K 3.8, creatinine 0.68 Labs (12/23): K 3.6, creatinine 0.69, hgb 7.4 Labs (8/24): K 3.9, creatinine 0.71, hgbA1c 5.4   PMH:  1. Multiple sclerosis 2. Uterine fibroids 3. Fe deficiency anemia: Likely related to fibroids.  4. Chronic systolic CHF: Diagnosed in 5/19.  Nonischemic cardiomyopathy.  - Echo (6/19) with EF 10-15%.  - RHC/LHC (6/19): mean RA 5, PA 30/13 mean 22, mean PCWP 9, CI 2.63.  No angiographic CAD.  - Cardiac MRI (6/19): EF 14% with severely dilated LV, mildly dilated RV with mildly decreased systolic function, no myocardial LGE.  - Echo (8/19): EF 30%, diffuse hypokinesis, mildly dilated RV with normal systolic function, PASP 40 mmHg.  - Echo (11/19): EF 35%, diffuse hypokinesis, mildly dilated RV with normal systolic function.  - Echo (4/20): EF 40% with mild LV dilation and mild LVH, normal RV, normal IVC.  - Echo (5/21): EF 45%, mild LVH, normal RV, normal IVC - Echo (5/22): EF 40%, diffuse hypokinesis, normal RV, mild MR. - Echo (10/23): EF 40-45%, global hypokinesis, normal RV.  - Echo (11/24): EF 35-40%, global hypokinesis, RV normal, IVC normal.  5. Sleep study (9/19): No OSA.  6. BPPV 7. COVID-19 12/22  Past Surgical History:  Procedure Laterality Date   CARDIAC CATHETERIZATION  01/12/2018  Current Outpatient Medications  Medication Sig Dispense Refill   acetaminophen (TYLENOL) 500 MG tablet Take 1,000 mg by mouth 2 (two) times daily as needed for mild pain, moderate pain, fever or headache.     carvedilol (COREG) 12.5 MG tablet Take 1 tablet (12.5 mg total) by mouth 2 (two) times daily with a meal. 180 tablet 3   Cholecalciferol (VITAMIN D-3 PO) Take 1 capsule by mouth in the morning.     FARXIGA 10 MG TABS tablet Take 1 tablet (10 mg total) by mouth daily before breakfast. 30 tablet 11   ferrous sulfate 325 (65 FE) MG tablet Take 1 tablet (325 mg total) by mouth 2 (two) times  daily with a meal. 60 tablet 3   gabapentin (NEURONTIN) 100 MG capsule Take 100 mg by mouth 3 (three) times daily as needed.     sacubitril-valsartan (ENTRESTO) 97-103 MG Take 1 tablet by mouth 2 (two) times daily. 180 tablet 3   spironolactone (ALDACTONE) 25 MG tablet Take 1 tablet (25 mg total) by mouth at bedtime. 90 tablet 3   Teriflunomide (AUBAGIO) 7 MG TABS Take 1 tablet by mouth daily.     No current facility-administered medications for this encounter.    Allergies:   Patient has no known allergies.   Social History:  The patient  reports that she has never smoked. She has never used smokeless tobacco. She reports that she does not drink alcohol and does not use drugs.   Family History:  The patient's family history includes Diabetes in her father and mother; Fibroids in her sister; Heart disease in her mother; Hypertension in her mother; Obesity in her mother; Stroke in her mother.   ROS:  Please see the history of present illness.   All other systems are personally reviewed and negative.   Exam:   BP 110/70   Pulse 75   Wt (!) 150.1 kg (331 lb)   SpO2 98%   BMI 51.84 kg/m  General: NAD, obese.  Neck: No JVD, no thyromegaly or thyroid nodule.  Lungs: Clear to auscultation bilaterally with normal respiratory effort. CV: Nondisplaced PMI.  Heart regular S1/S2, no S3/S4, no murmur.  No peripheral edema.  No carotid bruit.  Normal pedal pulses.  Abdomen: Soft, nontender, no hepatosplenomegaly, no distention.  Skin: Intact without lesions or rashes.  Neurologic: Alert and oriented x 3.  Psych: Normal affect. Extremities: No clubbing or cyanosis.  HEENT: Normal.   Recent Labs: 07/30/2022: ALT 12 03/28/2023: Hemoglobin 11.6; Platelets 211 06/27/2023: B Natriuretic Peptide 73.4; BUN 7; Creatinine, Ser 0.65; Potassium 4.1; Sodium 140  Personally reviewed   Wt Readings from Last 3 Encounters:  06/27/23 (!) 150.1 kg (331 lb)  04/25/23 (!) 149.7 kg (330 lb)  03/28/23 (!) 150  kg (330 lb 9.6 oz)     ASSESSMENT AND PLAN:  1. Chronic systolic CHF: Echo 5/19 with EF 10-15%, moderate MR. Nonischemic cardiomyopathy with no coronary disease on 6/19 cath. 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 Mother developed CHF in her 41s but no other family members have CHF that she knows of (has several healthy siblings).  She does not drink ETOH or use drugs.  No children, has not been pregnant so not peri-partum CMP.  TSH normal.  RHC 01/12/18 with normal filling pressures and preserved CO. Cardiac MRI 01/12/18 did not show LGE, EF 14%. Repeat echo 11/19 showed EF up to 35%.  Echo in 4/20 showed EF 40%.  Echo in 5/21 showed EF up to 45%.  Echo in 5/22 showed EF 40%.  Echo in 10/23 showed EF 40-45%, global hypokinesis, normal RV.   NYHA class II-III symptoms, anemia likely plays a role in dyspnea.  Weight has trended up but she is not volume overloaded on exam.    - Continue Entresto 97/103 bid. BMET/BNP today.  - Continue Coreg 12.5 mg twice a day.  - Continue spironolactone 25 daily.  - She felt "weak" on Farxiga but is willing to try Jardiance.  Will start at 10 mg daily with BMET in 10 days.   - She did not tolerate even low dose Bidil.   - EF was out of ICD range on last echo.  2. Obesity: We again discussed weight loss with diet and exercise.   - Insurance would not cover semaglutide, can try again in the new year.  3.  Fe deficiency anemia: Due to bleeding from uterine fibroids.  - Check CBC, TIBC, Fe, ferritin today.  Will treat Fe deficiency with IV Fe if present.  - Hysterectomy has been recommended.  She will be moderate risk for anesthesia but not prohibitive.   Followup in 3 months with APP  Signed, Marca Ancona, MD  06/29/2023  [facility] [address] [facility] [place] [address] [phone] (office) [phone] (fax)

## 2023-06-30 ENCOUNTER — Telehealth (HOSPITAL_COMMUNITY): Payer: Self-pay | Admitting: Cardiology

## 2023-06-30 MED ORDER — EMPAGLIFLOZIN 10 MG PO TABS: 10.0000 mg | ORAL_TABLET | Freq: Every day | ORAL | 11 refills | Status: DC

## 2023-06-30 NOTE — Telephone Encounter (Signed)
Pt called to report wrong rx was sent to pharmacy Was told to stop farxiga and start jardiance however just picked up RX for farxiga    OV note and AVS confirm pt is supposed to be on jardiance  Meds update

## 2023-07-08 ENCOUNTER — Other Ambulatory Visit (HOSPITAL_COMMUNITY): Payer: Managed Care, Other (non HMO)

## 2023-07-15 ENCOUNTER — Ambulatory Visit (HOSPITAL_COMMUNITY)
Admission: RE | Admit: 2023-07-15 | Discharge: 2023-07-15 | Disposition: A | Discharge status: Home / Self Care | Payer: Commercial Managed Care - HMO | Source: Ambulatory Visit | Attending: Cardiology | Admitting: Cardiology

## 2023-07-15 DIAGNOSIS — I5022 Chronic systolic (congestive) heart failure: Secondary | ICD-10-CM | POA: Insufficient documentation

## 2023-07-15 LAB — BASIC METABOLIC PANEL
Anion gap: 7 (ref 5–15)
BUN: 12 mg/dL (ref 6–20)
CO2: 25 mmol/L (ref 22–32)
Calcium: 9 mg/dL (ref 8.9–10.3)
Chloride: 107 mmol/L (ref 98–111)
Creatinine, Ser: 0.66 mg/dL (ref 0.44–1.00)
GFR, Estimated: >60 mL/min (ref >60)
Glucose, Bld: 103 mg/dL — ABNORMAL HIGH (ref 70–99)
Potassium: 3.9 mmol/L (ref 3.5–5.1)
Sodium: 139 mmol/L (ref 135–145)

## 2023-07-28 ENCOUNTER — Ambulatory Visit (HOSPITAL_COMMUNITY): Payer: Commercial Managed Care - HMO

## 2023-08-05 ENCOUNTER — Encounter (HOSPITAL_COMMUNITY): Payer: Self-pay

## 2023-08-05 ENCOUNTER — Ambulatory Visit (HOSPITAL_COMMUNITY): Admission: RE | Admit: 2023-08-05 | Payer: Commercial Managed Care - HMO | Source: Ambulatory Visit

## 2023-09-24 NOTE — Progress Notes (Signed)
ID:  Amanda [name], DOB 1981-03-03, MRN [id number]   Provider location: [place] [facility] Type of Visit: Established patient   PCP:  Wilfrid Lund, PA  HF Cardiologist:  Dr. Shirlee Latch.    HPI: Amanda [name] is a 43 y.o. who has a history of multiple sclerosis, anemia due to uterine fibroids, and systolic HF (dx 12/2017).   Admitted to [facility] 5/31-01/13/18  with volume overload and found to have acute systolic heart failure. Echo showed EF 10-15%. HF team consulted. R/LHC completed, which showed no CAD and preserved CO. Cardiac MRI showed no LGE, EF 14%. She was diuresed with IV lasix and transitioned to lasix 40 mg daily. HF meds were optimized. DC weight: 299 lbs.   Echo in 11/18 showed EF 35% with diffuse hypokinesis.    She has been unable to tolerate even low dose Bidil.   Echo in 4/20 showed EF 40%, mild LVH, normal RV, normal IVC.   Echo in 5/21 showed EF 45%, normal RV.   Echo in 5/22 showed EF 40%, diffuse hypokinesis, normal RV, mild MR.   Echo in 10/23 showed EF 40-45%, global hypokinesis, normal RV.   Echo 11/24 EF 35-40%, global hypokinesis, RV normal, IVC normal.   Today she returns for HF follow up. Overall feeling fine. Has had atypical chest pain off and on for the past 2 weeks. She attributes this to on-going stress related to her mother's declining health. Pain was worse with inspiration, improved with rest and positional changes. Occasional dizziness, she thinks this is related to her MS, no falls. Denies increasing SOB, palpitations, abnormal bleeding,  edema, or PND/Orthopnea. Appetite ok. No fever or chills. Weight at home 325 pounds. Taking all medications. Having more frequent MS flares (follows with [place] [facility]) but her physician has retired and she is asking for referral for Neurology office in [place].  ECG (personally reviewed): NSR 79 bpm, no acute ST-T changes  Labs (5/23): K 3.8, creatinine 0.68 Labs (12/23): K 3.6, creatinine 0.69, hgb  7.4 Labs (8/24): K 3.9, creatinine 0.71, hgbA1c 5.4 Labs (12/24): K 3.9, creatinine 0.66   PMH:  1. Multiple sclerosis 2. Uterine fibroids 3. Fe deficiency anemia: Likely related to fibroids.  4. Chronic systolic CHF: Diagnosed in 5/19.  Nonischemic cardiomyopathy.  - Echo (6/19) with EF 10-15%.  - RHC/LHC (6/19): mean RA 5, PA 30/13 mean 22, mean PCWP 9, CI 2.63.  No angiographic CAD.  - Cardiac MRI (6/19): EF 14% with severely dilated LV, mildly dilated RV with mildly decreased systolic function, no myocardial LGE.  - Echo (8/19): EF 30%, diffuse hypokinesis, mildly dilated RV with normal systolic function, PASP 40 mmHg.  - Echo (11/19): EF 35%, diffuse hypokinesis, mildly dilated RV with normal systolic function.  - Echo (4/20): EF 40% with mild LV dilation and mild LVH, normal RV, normal IVC.  - Echo (5/21): EF 45%, mild LVH, normal RV, normal IVC - Echo (5/22): EF 40%, diffuse hypokinesis, normal RV, mild MR. - Echo (10/23): EF 40-45%, global hypokinesis, normal RV.  - Echo (11/24): EF 35-40%, global hypokinesis, RV normal, IVC normal.  5. Sleep study (9/19): No OSA.  6. BPPV 7. COVID-19 12/22  Past Surgical History:  Procedure Laterality Date   CARDIAC CATHETERIZATION  01/12/2018   Current Outpatient Medications  Medication Sig Dispense Refill   acetaminophen (TYLENOL) 500 MG tablet Take 1,000 mg by mouth 2 (two) times daily as needed for mild pain, moderate pain, fever or headache.  carvedilol (COREG) 12.5 MG tablet Take 1 tablet (12.5 mg total) by mouth 2 (two) times daily with a meal. 180 tablet 3   Cholecalciferol (VITAMIN D-3 PO) Take 1 capsule by mouth in the morning.     empagliflozin (JARDIANCE) 10 MG TABS tablet Take 1 tablet (10 mg total) by mouth daily before breakfast. D/C Farxiga 30 tablet 11   ferrous sulfate 325 (65 FE) MG tablet Take 1 tablet (325 mg total) by mouth 2 (two) times daily with a meal. 60 tablet 3   gabapentin (NEURONTIN) 100 MG capsule Take 100  mg by mouth 3 (three) times daily as needed.     sacubitril-valsartan (ENTRESTO) 97-103 MG Take 1 tablet by mouth 2 (two) times daily. 180 tablet 3   spironolactone (ALDACTONE) 25 MG tablet Take 1 tablet (25 mg total) by mouth at bedtime. 90 tablet 3   Teriflunomide (AUBAGIO) 7 MG TABS Take 1 tablet by mouth daily.     No current facility-administered medications for this encounter.   Allergies:   Patient has no known allergies.   Social History:  The patient  reports that she has never smoked. She has never used smokeless tobacco. She reports that she does not drink alcohol and does not use drugs.   Family History:  The patient's family history includes Diabetes in her father and mother; Fibroids in her sister; Heart disease in her mother; Hypertension in her mother; Obesity in her mother; Stroke in her mother.   ROS:  Please see the history of present illness.   All other systems are personally reviewed and negative.   Recent Labs: 03/28/2023: Hemoglobin 11.6; Platelets 211 06/27/2023: B Natriuretic Peptide 73.4 07/15/2023: BUN 12; Creatinine, Ser 0.66; Potassium 3.9; Sodium 139  Personally reviewed   Wt Readings from Last 3 Encounters:  09/26/23 (!) 150.2 kg (331 lb 3.2 oz)  06/27/23 (!) 150.1 kg (331 lb)  04/25/23 (!) 149.7 kg (330 lb)    BP (!) 132/98   Pulse 81   Wt (!) 150.2 kg (331 lb 3.2 oz)   SpO2 97%   BMI 51.87 kg/m   Physical Exam General:  NAD. No resp difficulty, walked into clinic HEENT: Normal Neck: Supple. No JVD. Cor: Regular rate & rhythm. No rubs, gallops or murmurs. Lungs: Clear Abdomen: Soft, obese, nontender, nondistended.  Extremities: No cyanosis, clubbing, rash, edema Neuro: Alert & oriented x 3, moves all 4 extremities w/o difficulty. Affect pleasant.  ASSESSMENT AND PLAN: 1. Chronic systolic CHF: Echo 5/19 with EF 10-15%, moderate MR. Nonischemic cardiomyopathy with no coronary disease on 6/19 cath. 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  Mother developed CHF in her 66s but no other family members have CHF that she knows of (has several healthy siblings).  She does not drink ETOH or use drugs.  No children, has not been pregnant so not peri-partum CMP.  TSH normal.  RHC 01/12/18 with normal filling pressures and preserved CO. Cardiac MRI 01/12/18 did not show LGE, EF 14%. Repeat echo 11/19 showed EF up to 35%.  Echo in 4/20 showed EF 40%.  Echo in 5/21 showed EF up to 45%.  Echo in 5/22 showed EF 40%.  Echo in 10/23 showed EF 40-45%, global hypokinesis, normal RV.  Echo 11/24 showed EF 35-40%, global hypokinesis, RV normal, IVC normal.  NYHA class I-II symptoms. She is not volume overloaded today. - Continue Entresto 97/103 bid. BMET/BNP today.  - Continue Coreg 12.5 mg bid - Continue spironolactone 25 mg daily.  - Continue  Jardiance 10 mg daily (She felt "weak" on Farxiga)   - She did not tolerate even low dose Bidil.   - EF was out of ICD range on last echo.  - Discussed fetotoxicity of GDMT. She is married but was told she would be unable to conceive. 2. Obesity: Body mass index is 51.87 kg/m. - Insurance would not cover semaglutide, consider re-trial now that it's a new year 3.  Fe deficiency anemia: Due to bleeding from uterine fibroids.  - Check iron studies and arrange iron infusion if deficient - Hysterectomy has been recommended.  She will be moderate risk for anesthesia but not prohibitive.  4. Chest pain: Pain atypical. ECG OK today. Suspect MSK vs anemia vs stress-related. - Checking labs today. 5. MS: She has followed with Neurology in [place] but her physician has retired. She is having more frequent MS flares. [place] - Will refer to [facility].  Follow up in 3 months with Dr. Shirlee Latch  Signed, [facility] FNP-BC 09/26/2023  [facility] [address] [facility] [place] [address] [phone]  (office) [phone] (fax)

## 2023-09-25 ENCOUNTER — Telehealth (HOSPITAL_COMMUNITY): Payer: Self-pay

## 2023-09-25 NOTE — Telephone Encounter (Signed)
Called to confirm/remind patient of their appointment at the [facility] on 09/26/23.   Patient reminded to bring all medications and/or complete list.  Confirmed patient has transportation. Gave directions, instructed to utilize valet parking.  Confirmed appointment prior to ending call.

## 2023-09-26 ENCOUNTER — Encounter (HOSPITAL_COMMUNITY): Payer: Self-pay

## 2023-09-26 ENCOUNTER — Ambulatory Visit (HOSPITAL_COMMUNITY)
Admission: RE | Admit: 2023-09-26 | Discharge: 2023-09-26 | Disposition: A | Discharge status: Home / Self Care | Payer: Commercial Managed Care - HMO | Source: Ambulatory Visit | Attending: Family Medicine | Admitting: Family Medicine

## 2023-09-26 ENCOUNTER — Other Ambulatory Visit (HOSPITAL_COMMUNITY): Payer: Self-pay

## 2023-09-26 VITALS — BP 132/98 | HR 81 | Wt 331.2 lb

## 2023-09-26 DIAGNOSIS — I5022 Chronic systolic (congestive) heart failure: Secondary | ICD-10-CM

## 2023-09-26 DIAGNOSIS — R079 Chest pain, unspecified: Secondary | ICD-10-CM

## 2023-09-26 DIAGNOSIS — G35 Multiple sclerosis: Secondary | ICD-10-CM | POA: Diagnosis not present

## 2023-09-26 DIAGNOSIS — G35D Multiple sclerosis, unspecified: Secondary | ICD-10-CM

## 2023-09-26 DIAGNOSIS — D508 Other iron deficiency anemias: Secondary | ICD-10-CM | POA: Diagnosis not present

## 2023-09-26 DIAGNOSIS — D509 Iron deficiency anemia, unspecified: Secondary | ICD-10-CM | POA: Insufficient documentation

## 2023-09-26 DIAGNOSIS — Z8249 Family history of ischemic heart disease and other diseases of the circulatory system: Secondary | ICD-10-CM | POA: Diagnosis not present

## 2023-09-26 DIAGNOSIS — Z6841 Body Mass Index (BMI) 40.0 and over, adult: Secondary | ICD-10-CM | POA: Insufficient documentation

## 2023-09-26 DIAGNOSIS — Z5971 Insufficient health insurance coverage: Secondary | ICD-10-CM | POA: Diagnosis not present

## 2023-09-26 DIAGNOSIS — E669 Obesity, unspecified: Secondary | ICD-10-CM | POA: Insufficient documentation

## 2023-09-26 DIAGNOSIS — I428 Other cardiomyopathies: Secondary | ICD-10-CM | POA: Insufficient documentation

## 2023-09-26 DIAGNOSIS — R0789 Other chest pain: Secondary | ICD-10-CM | POA: Insufficient documentation

## 2023-09-26 DIAGNOSIS — Z7984 Long term (current) use of oral hypoglycemic drugs: Secondary | ICD-10-CM | POA: Insufficient documentation

## 2023-09-26 DIAGNOSIS — D259 Leiomyoma of uterus, unspecified: Secondary | ICD-10-CM | POA: Insufficient documentation

## 2023-09-26 DIAGNOSIS — Z79899 Other long term (current) drug therapy: Secondary | ICD-10-CM | POA: Insufficient documentation

## 2023-09-26 LAB — IRON AND TIBC
Iron: 29 ug/dL (ref 28–170)
Saturation Ratios: 8 % — ABNORMAL LOW (ref 10.4–31.8)
TIBC: 365 ug/dL (ref 250–450)
UIBC: 336 ug/dL

## 2023-09-26 LAB — FERRITIN: Ferritin: 10 ng/mL — ABNORMAL LOW (ref 11–307)

## 2023-09-26 LAB — CBC
HCT: 36.9 % (ref 36.0–46.0)
Hemoglobin: 11.6 g/dL — ABNORMAL LOW (ref 12.0–15.0)
MCH: 27.2 pg (ref 26.0–34.0)
MCHC: 31.4 g/dL (ref 30.0–36.0)
MCV: 86.4 fL (ref 80.0–100.0)
Platelets: 218 10*3/uL (ref 150–400)
RBC: 4.27 MIL/uL (ref 3.87–5.11)
RDW: 14.6 % (ref 11.5–15.5)
WBC: 4.6 10*3/uL (ref 4.0–10.5)
nRBC: 0 % (ref 0.0–0.2)

## 2023-09-26 LAB — BASIC METABOLIC PANEL
Anion gap: 6 (ref 5–15)
BUN: 10 mg/dL (ref 6–20)
CO2: 24 mmol/L (ref 22–32)
Calcium: 9 mg/dL (ref 8.9–10.3)
Chloride: 109 mmol/L (ref 98–111)
Creatinine, Ser: 0.71 mg/dL (ref 0.44–1.00)
GFR, Estimated: >60 mL/min (ref >60)
Glucose, Bld: 107 mg/dL — ABNORMAL HIGH (ref 70–99)
Potassium: 4.2 mmol/L (ref 3.5–5.1)
Sodium: 139 mmol/L (ref 135–145)

## 2023-09-26 LAB — BRAIN NATRIURETIC PEPTIDE: B Natriuretic Peptide: 35.4 pg/mL (ref 0.0–100.0)

## 2023-09-26 NOTE — Patient Instructions (Addendum)
Thank you for coming in today  If you had labs drawn today, any labs that are abnormal the clinic will call you No news is good news  You have been referred to [facility] [phone], their office will call you for further appointment details    Medications: No changes  Follow up appointments:  Your physician recommends that you schedule a follow-up appointment in:  3 months With Dr. Earlean Shawl will receive a reminder letter in the mail a few months in advance. If you don't receive a letter, please call our office to schedule the follow-up appointment.     Do the following things EVERYDAY: Weigh yourself in the morning before breakfast. Write it down and keep it in a log. Take your medicines as prescribed Eat low salt foods--Limit salt (sodium) to 2000 mg per day.  Stay as active as you can everyday Limit all fluids for the day to less than 2 liters   At the [facility], you and your health needs are our priority. As part of our continuing mission to provide you with exceptional heart care, we have created designated Provider Care Teams. These Care Teams include your primary Cardiologist (physician) and Advanced Practice Providers (APPs- Physician Assistants and Nurse Practitioners) who all work together to provide you with the care you need, when you need it.   You may see any of the following providers on your designated Care Team at your next follow up: Dr Arvilla Meres Dr Marca Ancona Dr. Marcos Eke, NP Robbie Lis, [place] [facility] [place], [place] Brynda Peon, NP Karle Plumber, PharmD   Please be sure to bring in all your medications bottles to every appointment.    Thank you for choosing [facility]  If you have any questions or concerns before your next appointment please send [place] a message through [place] or call our office at [phone].    TO LEAVE A MESSAGE FOR  THE NURSE SELECT OPTION 2, PLEASE LEAVE A MESSAGE INCLUDING: YOUR NAME DATE OF BIRTH CALL BACK NUMBER REASON FOR CALL**this is important as we prioritize the call backs  YOU WILL RECEIVE A CALL BACK THE SAME DAY AS LONG AS YOU CALL BEFORE 4:00 PM

## 2023-10-14 ENCOUNTER — Ambulatory Visit (HOSPITAL_COMMUNITY)
Admission: RE | Admit: 2023-10-14 | Discharge: 2023-10-14 | Disposition: A | Discharge status: Home / Self Care | Payer: Commercial Managed Care - HMO | Source: Ambulatory Visit | Attending: Cardiology | Admitting: Cardiology

## 2023-10-14 DIAGNOSIS — I5022 Chronic systolic (congestive) heart failure: Secondary | ICD-10-CM | POA: Diagnosis present

## 2023-10-14 MED ORDER — SODIUM CHLORIDE 0.9 % IV SOLN
510.0000 mg | Freq: Once | INTRAVENOUS | Status: AC
  Administered 2023-10-14: 510 mg via INTRAVENOUS
  Filled 2023-10-14: qty 510

## 2023-11-02 NOTE — Progress Notes (Deleted)
 [facility]  PATIENT: Amanda Amanda [name] DOB: [birth year]  REFERRING DOCTOR OR PCP:  *** SOURCE: ***  _________________________________   HISTORICAL  CHIEF COMPLAINT:  No chief complaint on file.   HISTORY OF PRESENT ILLNESS:  I had the pleasure of seeing your patient, Amanda Amanda [name], at the MS center [facility] for neurologic consultation regarding her multiple sclerosis.  She is a 43 year old woman   Imaging: MRI of the brain 05/29/2015 showed T2/FLAIR hyperintense foci in the periventricular, juxtacortical and deep white matter of the cerebral hemispheres.  Foci were also noted in the left midbrain and left middle cerebellar peduncle and right cerebellum adjacent to the fourth ventricle.  MRI of the brain 10/29/2021 showed multiple T2 hyperintense foci in the cerebral hemispheres, left pons and midbrain 1 focus in the right corpus callosum enhance after contrast.  It was felt that the number of T2 lesions increased compared to the MRI from January 2021.  REVIEW OF SYSTEMS: Constitutional: No fevers, chills, sweats, or change in appetite Eyes: No visual changes, double vision, eye pain Ear, nose and throat: No hearing loss, ear pain, nasal congestion, sore throat Cardiovascular: No chest pain, palpitations Respiratory:  No shortness of breath at rest or with exertion.   No wheezes GastrointestinaI: No nausea, vomiting, diarrhea, abdominal pain, fecal incontinence Genitourinary:  No dysuria, urinary retention or frequency.  No nocturia. Musculoskeletal:  No neck pain, Amanda [name] pain Integumentary: No rash, pruritus, skin lesions Neurological: as above Psychiatric: No depression at this time.  No anxiety Endocrine: No palpitations, diaphoresis, change in appetite, change in weigh or increased thirst Hematologic/Lymphatic:  No anemia, purpura, petechiae. Allergic/Immunologic: No itchy/runny eyes, nasal congestion, recent allergic reactions,  rashes  ALLERGIES: No Known Allergies  HOME MEDICATIONS:  Current Outpatient Medications:    acetaminophen (TYLENOL) 500 MG tablet, Take 1,000 mg by mouth 2 (two) times daily as needed for mild pain, moderate pain, fever or headache., Disp: , Rfl:    carvedilol (COREG) 12.5 MG tablet, Take 1 tablet (12.5 mg total) by mouth 2 (two) times daily with a meal., Disp: 180 tablet, Rfl: 3   Cholecalciferol (VITAMIN D-3 PO), Take 1 capsule by mouth in the morning., Disp: , Rfl:    empagliflozin (JARDIANCE) 10 MG TABS tablet, Take 1 tablet (10 mg total) by mouth daily before breakfast. D/C Farxiga, Disp: 30 tablet, Rfl: 11   ferrous sulfate 325 (65 FE) MG tablet, Take 1 tablet (325 mg total) by mouth 2 (two) times daily with a meal., Disp: 60 tablet, Rfl: 3   gabapentin (NEURONTIN) 100 MG capsule, Take 100 mg by mouth 3 (three) times daily as needed., Disp: , Rfl:    sacubitril-valsartan (ENTRESTO) 97-103 MG, Take 1 tablet by mouth 2 (two) times daily., Disp: 180 tablet, Rfl: 3   spironolactone (ALDACTONE) 25 MG tablet, Take 1 tablet (25 mg total) by mouth at bedtime., Disp: 90 tablet, Rfl: 3   Teriflunomide (AUBAGIO) 7 MG TABS, Take 1 tablet by mouth daily., Disp: , Rfl:   PAST MEDICAL HISTORY: Past Medical History:  Diagnosis Date   Anemia    BP (high blood pressure)    Chest pain    CHF (congestive heart failure) (HCC)    Edema, lower extremity    History of blood transfusion 08/2017   "related to low blood count"    Infertility, female    Joint pain    Multiple sclerosis (HCC)    Pneumonia 12/2017   Shortness of breath  Swallowing difficulty    Vitamin D deficiency     PAST SURGICAL HISTORY: Past Surgical History:  Procedure Laterality Date   CARDIAC CATHETERIZATION  01/12/2018    FAMILY HISTORY: Family History  Problem Relation Age of Onset   Diabetes Mother    Hypertension Mother    Heart disease Mother    Stroke Mother    Obesity Mother    Fibroids Sister     Diabetes Father     SOCIAL HISTORY: Social History   Socioeconomic History   Marital status: Married    Spouse name: John   Number of children: Not on file   Years of education: Not on file   Highest education level: Not on file  Occupational History   Occupation: stay at home spouse  Tobacco Use   Smoking status: Never   Smokeless tobacco: Never  Vaping Use   Vaping status: Never Used  Substance and Sexual Activity   Alcohol use: Never   Drug use: Never   Sexual activity: Yes  Other Topics Concern   Not on file  Social History Narrative   Not on file   Social Drivers of Health   Financial Resource Strain: Medium Risk (04/21/2018)   Overall Financial Resource Strain (CARDIA)    Difficulty of Paying Living Expenses: Somewhat hard  Food Insecurity: Not on file  Transportation Needs: Not on file  Physical Activity: Inactive (04/21/2018)   Exercise Vital Sign    Days of Exercise per Week: 0 days    Minutes of Exercise per Session: 0 min  Stress: No Stress Concern Present (04/21/2018)   [employer] of Occupational Health - Occupational Stress Questionnaire    Feeling of Stress : Only a little  Social Connections: Unknown (12/10/2021)   Received from [facility], [facility]   Social Network    Social Network: Not on file  Intimate Partner Violence: Unknown (11/15/2021)   Received from [facility], [facility]   HITS    Physically Hurt: Not on file    Insult or Talk Down To: Not on file    Threaten Physical Harm: Not on file    Scream or Curse: Not on file       PHYSICAL EXAM  There were no vitals filed for this visit.  There is no height or weight on file to calculate BMI.   General: The patient is well-developed and well-nourished and in no acute distress  HEENT:  Head is [place]/AT.  Sclera are anicteric.  Funduscopic exam shows normal optic discs and retinal vessels.  Neck: No carotid bruits are noted.  The neck is nontender.  Cardiovascular: The  heart has a regular rate and rhythm with a normal S1 and S2. There were no murmurs, gallops or rubs.    Skin: Extremities are without rash or  edema.  Musculoskeletal:  Amanda [name] is nontender  Neurologic Exam  Mental status: The patient is alert and oriented x 3 at the time of the examination. The patient has apparent normal recent and remote memory, with an apparently normal attention span and concentration ability.   Speech is normal.  Cranial nerves: Extraocular movements are full. Pupils are equal, round, and reactive to light and accomodation.  Visual fields are full.  Facial symmetry is present. There is good facial sensation to soft touch bilaterally.Facial strength is normal.  Trapezius and sternocleidomastoid strength is normal. No dysarthria is noted.  The tongue is midline, and the patient has symmetric elevation of the soft palate. No obvious  hearing deficits are noted.  Motor:  Muscle bulk is normal.   Tone is normal. Strength is  5 / 5 in all 4 extremities.   Sensory: Sensory testing is intact to pinprick, soft touch and vibration sensation in all 4 extremities.  Coordination: Cerebellar testing reveals good finger-nose-finger and heel-to-shin bilaterally.  Gait and station: Station is normal.   Gait is normal. Tandem gait is normal. Romberg is negative.   Reflexes: Deep tendon reflexes are symmetric and normal bilaterally.   Plantar responses are flexor.    DIAGNOSTIC DATA (LABS, IMAGING, TESTING) - I reviewed patient records, labs, notes, testing and imaging myself where available.  Lab Results  Component Value Date   WBC 4.6 09/26/2023   HGB 11.6 (L) 09/26/2023   HCT 36.9 09/26/2023   MCV 86.4 09/26/2023   PLT 218 09/26/2023      Component Value Date/Time   NA 139 09/26/2023 0933   NA 142 02/08/2020 1448   K 4.2 09/26/2023 0933   CL 109 09/26/2023 0933   CO2 24 09/26/2023 0933   GLUCOSE 107 (H) 09/26/2023 0933   BUN 10 09/26/2023 0933   BUN 9 02/08/2020 1448    CREATININE 0.71 09/26/2023 0933   CALCIUM 9.0 09/26/2023 0933   PROT 7.3 07/30/2022 1414   PROT 6.6 02/08/2020 1448   ALBUMIN 3.5 07/30/2022 1414   ALBUMIN 3.7 (L) 02/08/2020 1448   AST 21 07/30/2022 1414   ALT 12 07/30/2022 1414   ALKPHOS 32 (L) 07/30/2022 1414   BILITOT 0.7 07/30/2022 1414   BILITOT 0.3 02/08/2020 1448   GFRNONAA >60 09/26/2023 0933   GFRAA 129 02/08/2020 1448   Lab Results  Component Value Date   CHOL 147 02/08/2020   HDL 40 02/08/2020   LDLCALC 94 02/08/2020   TRIG 62 02/08/2020   CHOLHDL 3.7 02/08/2020   Lab Results  Component Value Date   HGBA1C 5.4 03/28/2023   Lab Results  Component Value Date   VITAMINB12 356 02/08/2020   Lab Results  Component Value Date   TSH 3.250 02/08/2020       ASSESSMENT AND PLAN  ***   Fidel Caggiano A. Epimenio Foot, MD, [facility] 11/02/2023, 6:42 PM Certified in Neurology, Clinical Neurophysiology, Sleep Medicine and Neuroimaging  [facility] [address], [address] [place], [place] [address] [phone]

## 2023-11-03 ENCOUNTER — Encounter: Payer: Self-pay | Admitting: *Deleted

## 2023-11-04 ENCOUNTER — Ambulatory Visit: Admitting: Neurology

## 2023-11-15 NOTE — Progress Notes (Unsigned)
 [facility]  PATIENT: Amanda [name] DOB: 04/04/1981  REFERRING DOCTOR OR PCP: Prince Rome, FNP; Horton Marshall, PA SOURCE: Patient, notes from previous neurologist, imaging and lab reports, MRI images personally reviewed.  _________________________________   HISTORICAL  CHIEF COMPLAINT:  Chief Complaint  Patient presents with   New Patient (Initial Visit)    Pt in 11 alone Pt here for MS consultations Pt states hasn't had Teriflunomide  in 2 months Pt states last  eye exam was Nov 2024     HISTORY OF PRESENT ILLNESS:  I had the pleasure of seeing your patient, Amanda [name], at the MS center [facility] for neurologic consultation regarding her multiple sclerosis.  She is a 43 year old woman who was diagnsed with MS in 2011.   At the time, her right leg ws giving out on her nad speech was slurred.  Symptoms progressed over the next couple days and she saw her PCP and referred her to Dr. Lynnea Ferrier (Neuro) in [place].   She had MRIs and LP and w diagnosed with MS.  She was started on Copaxone and then switched to Aubagio.  She tolerated it well   However, insurance is going to stop covering her copay was going to be very high and she stopped around Jan or Feb 2025.   Over the past 2 years, she has had several relapses, some occurring while she was on medication..  Since she stopped Aubagio, she has noted more spasticity and stiffness spasms in the right arm lasting 5 minutes at a time.  These started 07/2022.   She is also noting more numbness in her right foot.   She has had milder toe numbness on the left.   She has had more word finding difficulty and pauses in her speech the last 2 months.    Currently, gait is off balanced.  The right leg is weaker than the left and is spastic.  She notes a mild right foot drop.  Since her recent relapse in December 2024, she has right flank and dow to leg/foot numbness.     On the left, her toes are numb.    Bladder function is fine.  Vision is sometimes blurry OS.     She has some word finding and mild fluency difficulty.  This also seems new over the last 6 months.  Co-morbidities:   She has chronic systolic CHF (post-viral) and on Entresto, carvedilol and was on Jardiance. (For CHF not DM).     Has 2 paternal cousins with MS - one died of related complications and one is wheelchair bound  Imaging: MRI 06/14/2022 showed multiple T2/FLAIR hyperintense foci in the periventricular, juxtacortical and deep white matter of the cerebral hemispheres in a pattern consistent with MS.  Foci were also noted in the left middle cerebellar peduncle, bilateral cerebellum adjacent to the ventricle, and pons and left midbrain.  None of the foci enhance.  There is a small developmental venous anomaly in the right frontal lobe and the corpus callosal genu.  MRI of the brain 10/29/2021 showed multiple T2 hyperintense foci in the cerebral hemispheres, left pons and midbrain 1 focus in the right corpus callosum enhance after contrast.  It was felt that the number of T2 lesions increased compared to the MRI from January 2021.  MRI of the brain 05/29/2015 showed T2/FLAIR hyperintense foci in the periventricular, juxtacortical and deep white matter of the cerebral hemispheres.  Foci were also noted in the left midbrain and left  middle cerebellar peduncle and right cerebellum adjacent to the fourth ventricle.   REVIEW OF SYSTEMS: Constitutional: No fevers, chills, sweats, or change in appetite Eyes: No visual changes, double vision, eye pain Ear, nose and throat: No hearing loss, ear pain, nasal congestion, sore throat Cardiovascular: No chest pain, palpitations  Respiratory:  No shortness of breath at rest or with exertion.   No wheezes GastrointestinaI: No nausea, vomiting, diarrhea, abdominal pain, fecal incontinence Genitourinary:  No dysuria, urinary retention or frequency.  No nocturia. Musculoskeletal:  No neck  pain, [name] pain Integumentary: No rash, pruritus, skin lesions Neurological: as above Psychiatric: No depression at this time.  No anxiety Endocrine: No palpitations, diaphoresis, change in appetite, change in weigh or increased thirst Hematologic/Lymphatic:  No anemia, purpura, petechiae. Allergic/Immunologic: No itchy/runny eyes, nasal congestion, recent allergic reactions, rashes  ALLERGIES: No Known Allergies  HOME MEDICATIONS:  Current Outpatient Medications:    acetaminophen (TYLENOL) 500 MG tablet, Take 1,000 mg by mouth 2 (two) times daily as needed for mild pain, moderate pain, fever or headache., Disp: , Rfl:    carvedilol (COREG) 12.5 MG tablet, Take 1 tablet (12.5 mg total) by mouth 2 (two) times daily with a meal., Disp: 180 tablet, Rfl: 3   Cholecalciferol (VITAMIN D-3 PO), Take 1 capsule by mouth in the morning., Disp: , Rfl:    ferrous sulfate 325 (65 FE) MG tablet, Take 1 tablet (325 mg total) by mouth 2 (two) times daily with a meal., Disp: 60 tablet, Rfl: 3   gabapentin (NEURONTIN) 100 MG capsule, Take 100 mg by mouth 3 (three) times daily as needed., Disp: , Rfl:    sacubitril-valsartan (ENTRESTO) 97-103 MG, Take 1 tablet by mouth 2 (two) times daily., Disp: 180 tablet, Rfl: 3   spironolactone (ALDACTONE) 25 MG tablet, Take 1 tablet (25 mg total) by mouth at bedtime., Disp: 90 tablet, Rfl: 3   empagliflozin (JARDIANCE) 10 MG TABS tablet, Take 1 tablet (10 mg total) by mouth daily before breakfast. D/C Farxiga (Patient not taking: Reported on 11/18/2023), Disp: 30 tablet, Rfl: 11   Teriflunomide (AUBAGIO) 7 MG TABS, Take 1 tablet by mouth daily. (Patient not taking: Reported on 11/18/2023), Disp: , Rfl:   PAST MEDICAL HISTORY: Past Medical History:  Diagnosis Date   Anemia    BP (high blood pressure)    Chest pain    CHF (congestive heart failure) (HCC)    Edema, lower extremity    History of blood transfusion 08/2017   "related to low blood count"    Infertility,  female    Joint pain    Multiple sclerosis (HCC)    Pneumonia 12/2017   Shortness of breath    Swallowing difficulty    Vitamin D deficiency     PAST SURGICAL HISTORY: Past Surgical History:  Procedure Laterality Date   CARDIAC CATHETERIZATION  01/12/2018    FAMILY HISTORY: Family History  Problem Relation Age of Onset   Diabetes Mother    Hypertension Mother    Heart disease Mother    Stroke Mother    Obesity Mother    Diabetes Father    Fibroids Sister    Multiple sclerosis Cousin    Multiple sclerosis Cousin     SOCIAL HISTORY: Social History   Socioeconomic History   Marital status: Married    Spouse name: John   Number of children: Not on file   Years of education: Not on file   Highest education level: Not on file  Occupational History  Occupation: stay at home spouse  Tobacco Use   Smoking status: Never   Smokeless tobacco: Never  Vaping Use   Vaping status: Never Used  Substance and Sexual Activity   Alcohol use: Never   Drug use: Never   Sexual activity: Yes  Other Topics Concern   Not on file  Social History Narrative   Pt lives with husband    Pt not working   Social Drivers of Corporate investment banker Strain: Medium Risk (04/21/2018)   Overall Financial Resource Strain (CARDIA)    Difficulty of Paying Living Expenses: Somewhat hard  Food Insecurity: Not on file  Transportation Needs: Not on file  Physical Activity: Inactive (04/21/2018)   Exercise Vital Sign    Days of Exercise per Week: 0 days    Minutes of Exercise per Session: 0 min  Stress: No Stress Concern Present (04/21/2018)   [employer] of Occupational Health - Occupational Stress Questionnaire    Feeling of Stress : Only a little  Social Connections: Unknown (12/10/2021)   Received from [facility], [facility]   Social Network    Social Network: Not on file  Intimate Partner Violence: Unknown (11/15/2021)   Received from [facility], [facility]   HITS     Physically Hurt: Not on file    Insult or Talk Down To: Not on file    Threaten Physical Harm: Not on file    Scream or Curse: Not on file       PHYSICAL EXAM  Vitals:   11/18/23 0905  BP: 127/88  Pulse: 81  Weight: (!) 330 lb (149.7 kg)  Height: 5\' 6"  (1.676 m)    Body mass index is 53.26 kg/m.  Vision Screening   Right eye Left eye Both eyes  Without correction 20/40 20/70 20/70   With correction       Color vision was symmetric  General: The patient is well-developed and well-nourished and in no acute distress  HEENT:  Head is [place]/AT.  Sclera are anicteric.  Funduscopic exam shows normal optic discs and retinal vessels.  Neck: No carotid bruits are noted.  The neck is nontender.  Cardiovascular: The heart has a regular rate and rhythm with a normal S1 and S2. There were no murmurs, gallops or rubs.    Skin: Extremities are without rash or  edema.  Musculoskeletal:  [name] is nontender  Neurologic Exam  Mental status: The patient is alert and oriented x 3 at the time of the examination. The patient has apparent normal recent and remote memory, with an apparently normal attention span and concentration ability.   She had some word finding difficulties during our conversation..  Cranial nerves: Extraocular movements are full. Pupils are equal, round, and reactive to light and accomodation.  Visual fields are full.  Facial symmetry is present. There is good facial sensation to soft touch bilaterally.Facial strength is normal.  Trapezius and sternocleidomastoid strength is normal. No dysarthria is noted.  The tongue is midline, and the patient has symmetric elevation of the soft palate. No obvious hearing deficits are noted.  Motor:  Muscle bulk is normal.   Muscle tone is mildly increased in the right leg.  She has 4/5 iliopsoas, ankle extension and toe extension strength and 4+/5 elsewhere in the right leg.  Strength was 4+/5 in the left iliopsoas and 5/5  elsewhere.  Sensory: She has reduced sensation to temperature and touch below the right mid thoracic level all the way down the right  leg.  She also had reduced sensation to touch and vibration in the left foot relative to above the left ankle  Coordination: Cerebellar testing reveals good finger-nose-finger and reduced bilateral heel-to-shin .  Gait and station: Station is normal.   She has a mild right foot drop.  She is unable to do a tandem walk.  The Romberg sign was borderline.  Reflexes: Deep tendon reflexes are symmetric and normal bilaterally.   Plantar responses are flexor.    DIAGNOSTIC DATA (LABS, IMAGING, TESTING) - I reviewed patient records, labs, notes, testing and imaging myself where available.  Lab Results  Component Value Date   WBC 4.6 09/26/2023   HGB 11.6 (L) 09/26/2023   HCT 36.9 09/26/2023   MCV 86.4 09/26/2023   PLT 218 09/26/2023      Component Value Date/Time   NA 139 09/26/2023 0933   NA 142 02/08/2020 1448   K 4.2 09/26/2023 0933   CL 109 09/26/2023 0933   CO2 24 09/26/2023 0933   GLUCOSE 107 (H) 09/26/2023 0933   BUN 10 09/26/2023 0933   BUN 9 02/08/2020 1448   CREATININE 0.71 09/26/2023 0933   CALCIUM 9.0 09/26/2023 0933   PROT 7.3 07/30/2022 1414   PROT 6.6 02/08/2020 1448   ALBUMIN 3.5 07/30/2022 1414   ALBUMIN 3.7 (L) 02/08/2020 1448   AST 21 07/30/2022 1414   ALT 12 07/30/2022 1414   ALKPHOS 32 (L) 07/30/2022 1414   BILITOT 0.7 07/30/2022 1414   BILITOT 0.3 02/08/2020 1448   GFRNONAA >60 09/26/2023 0933   GFRAA 129 02/08/2020 1448   Lab Results  Component Value Date   CHOL 147 02/08/2020   HDL 40 02/08/2020   LDLCALC 94 02/08/2020   TRIG 62 02/08/2020   CHOLHDL 3.7 02/08/2020   Lab Results  Component Value Date   HGBA1C 5.4 03/28/2023   Lab Results  Component Value Date   VITAMINB12 356 02/08/2020   Lab Results  Component Value Date   TSH 3.250 02/08/2020       ASSESSMENT AND PLAN  Multiple sclerosis (HCC) -  Plan: IgG, IgA, IgM, HIV Antibody (routine testing w rflx), QuantiFERON-TB Gold Plus, Hepatitis B surface antigen, Varicella zoster antibody, IgG, Hepatitis B core antibody, total, CBC with Differential/Platelet, Comprehensive metabolic panel with GFR, Hepatitis B surface antibody,qualitative, MR BRAIN W WO CONTRAST, MR CERVICAL SPINE W WO CONTRAST, MR THORACIC SPINE W WO CONTRAST  Chronic systolic CHF (congestive heart failure) (HCC)  High risk medication use - Plan: IgG, IgA, IgM, HIV Antibody (routine testing w rflx), QuantiFERON-TB Gold Plus, Hepatitis B surface antigen, Varicella zoster antibody, IgG, Hepatitis B core antibody, total, CBC with Differential/Platelet, Comprehensive metabolic panel with GFR, Hepatitis B surface antibody,qualitative  Vitamin D deficiency - Plan: VITAMIN D 25 Hydroxy (Vit-D Deficiency, Fractures)  Gait disturbance - Plan: MR BRAIN W WO CONTRAST, MR CERVICAL SPINE W WO CONTRAST, MR THORACIC SPINE W WO CONTRAST  Fluency disorder - Plan: MR BRAIN W WO CONTRAST  Numbness - Plan: MR BRAIN W WO CONTRAST, MR CERVICAL SPINE W WO CONTRAST, MR THORACIC SPINE W WO CONTRAST   In summary, Ms. Blunck is a 43 year old woman who was diagnosed with MS in 2015 but has had several exacerbations over the last year.  Specifically, recent exacerbations include difficulties with numbness from the right flank to the right leg, word finding difficulty, onset of phasic spasms in the right arm.  I personally reviewed the MRI from 2023 and compared it to an MRI from 2016.  We will check an MRI of the brain as well as MRI of the cervical and thoracic spine to determine if there is any other explanation besides the MS causing her symptoms.  During that interval she developed multiple more lesions.  Because her MS is active and has become fairly aggressive, she needs to get [name] on a disease modifying therapy.  Due to the level of aggressiveness I recommend that she begin an anti-CD20 agent.  We  went over the risks and benefits of these treatments.  I will try to get her on Briumvi.  We will check lab work for chronic infections.  She has dysesthesias and is on a very low-dose of gabapentin.  I will increase that dose to 300 mg 3 times daily.  She also has spasticity in the right leg.  I will have her try baclofen to see if that helps the spasticity and gait.  She will return to see me in 4 or 5 months or sooner if there are new or worsening neurologic symptoms.  Thank you for asking me to see Ms. Schirmer.  Please let me know if I can be of further assistance with her or other patients in the future.    Tashay Bozich A. Epimenio Foot, MD, [facility] 11/18/2023, 9:13 AM Certified in Neurology, Clinical Neurophysiology, Sleep Medicine and Neuroimaging  [facility] [address], [address] [place], [place] [address] [phone]

## 2023-11-18 ENCOUNTER — Ambulatory Visit: Admitting: Neurology

## 2023-11-18 ENCOUNTER — Encounter: Payer: Self-pay | Admitting: Neurology

## 2023-11-18 VITALS — BP 127/88 | HR 81 | Ht 66.0 in | Wt 330.0 lb

## 2023-11-18 DIAGNOSIS — R200 Anesthesia of skin: Secondary | ICD-10-CM

## 2023-11-18 DIAGNOSIS — R269 Unspecified abnormalities of gait and mobility: Secondary | ICD-10-CM

## 2023-11-18 DIAGNOSIS — G35 Multiple sclerosis: Secondary | ICD-10-CM | POA: Diagnosis not present

## 2023-11-18 DIAGNOSIS — E559 Vitamin D deficiency, unspecified: Secondary | ICD-10-CM

## 2023-11-18 DIAGNOSIS — I5022 Chronic systolic (congestive) heart failure: Secondary | ICD-10-CM | POA: Diagnosis not present

## 2023-11-18 DIAGNOSIS — Z79899 Other long term (current) drug therapy: Secondary | ICD-10-CM | POA: Diagnosis not present

## 2023-11-18 DIAGNOSIS — R4789 Other speech disturbances: Secondary | ICD-10-CM

## 2023-11-18 MED ORDER — ALPRAZOLAM 0.5 MG PO TABS: ORAL_TABLET | ORAL | 0 refills | Status: DC

## 2023-11-18 MED ORDER — BACLOFEN 10 MG PO TABS: ORAL_TABLET | ORAL | 11 refills | Status: AC

## 2023-11-18 MED ORDER — GABAPENTIN 300 MG PO CAPS: 300.0000 mg | ORAL_CAPSULE | Freq: Three times a day (TID) | ORAL | 11 refills | Status: AC

## 2023-11-18 NOTE — Patient Instructions (Addendum)
 Medication changes: I increased the gabapentin to 300 mg three times a day  Start baclofen for spasticity.  Start with 1/2 pill three times a day and can increases to one pill three times a day  I also sent in a few Xanax tablets to take 1 or 2 before the MRIs  We discussed Briumvi and Ocrevus as two options that work the same way for MS.  We will check labs ad start one of these

## 2023-11-19 LAB — IGG, IGA, IGM
IgA/Immunoglobulin A, Serum: 99 mg/dL (ref 87–352)
IgG (Immunoglobin G), Serum: 1688 mg/dL — ABNORMAL HIGH (ref 586–1602)
IgM (Immunoglobulin M), Srm: 8 mg/dL — ABNORMAL LOW (ref 26–217)

## 2023-11-21 ENCOUNTER — Telehealth: Payer: Self-pay | Admitting: Neurology

## 2023-11-21 LAB — CBC WITH DIFFERENTIAL/PLATELET
Basophils Absolute: 0 10*3/uL (ref 0.0–0.2)
Basos: 1 %
EOS (ABSOLUTE): 0.2 10*3/uL (ref 0.0–0.4)
Eos: 5 %
Hematocrit: 35.6 % (ref 34.0–46.6)
Hemoglobin: 11.2 g/dL (ref 11.1–15.9)
Immature Grans (Abs): 0 10*3/uL (ref 0.0–0.1)
Immature Granulocytes: 0 %
Lymphocytes Absolute: 0.8 10*3/uL (ref 0.7–3.1)
Lymphs: 20 %
MCH: 27.9 pg (ref 26.6–33.0)
MCHC: 31.5 g/dL (ref 31.5–35.7)
MCV: 89 fL (ref 79–97)
Monocytes Absolute: 0.2 10*3/uL (ref 0.1–0.9)
Monocytes: 6 %
Neutrophils Absolute: 2.8 10*3/uL (ref 1.4–7.0)
Neutrophils: 68 %
Platelets: 231 10*3/uL (ref 150–450)
RBC: 4.01 x10E6/uL (ref 3.77–5.28)
RDW: 14.7 % (ref 11.7–15.4)
WBC: 4.1 10*3/uL (ref 3.4–10.8)

## 2023-11-21 LAB — QUANTIFERON-TB GOLD PLUS
QuantiFERON Mitogen Value: 7.86 [IU]/mL
QuantiFERON Nil Value: 0.04 [IU]/mL
QuantiFERON TB1 Ag Value: 0.04 [IU]/mL
QuantiFERON TB2 Ag Value: 0.04 [IU]/mL

## 2023-11-21 LAB — COMPREHENSIVE METABOLIC PANEL WITH GFR
ALT: 11 IU/L (ref 0–32)
AST: 16 IU/L (ref 0–40)
Albumin: 4.1 g/dL (ref 3.9–4.9)
Alkaline Phosphatase: 40 IU/L — ABNORMAL LOW (ref 44–121)
BUN/Creatinine Ratio: 18 (ref 9–23)
BUN: 12 mg/dL (ref 6–24)
Bilirubin Total: 0.2 mg/dL (ref 0.0–1.2)
CO2: 22 mmol/L (ref 20–29)
Calcium: 9.1 mg/dL (ref 8.7–10.2)
Chloride: 108 mmol/L — ABNORMAL HIGH (ref 96–106)
Creatinine, Ser: 0.66 mg/dL (ref 0.57–1.00)
Globulin, Total: 2.8 g/dL (ref 1.5–4.5)
Glucose: 86 mg/dL (ref 70–99)
Potassium: 4.5 mmol/L (ref 3.5–5.2)
Sodium: 142 mmol/L (ref 134–144)
Total Protein: 6.9 g/dL (ref 6.0–8.5)
eGFR: 113 mL/min/{1.73_m2} (ref >59)

## 2023-11-21 LAB — VITAMIN D 25 HYDROXY (VIT D DEFICIENCY, FRACTURES): Vit D, 25-Hydroxy: 35.8 ng/mL (ref 30.0–100.0)

## 2023-11-21 LAB — VARICELLA ZOSTER ANTIBODY, IGG: Varicella zoster IgG: REACTIVE

## 2023-11-21 LAB — HEPATITIS B SURFACE ANTIGEN: Hepatitis B Surface Ag: NEGATIVE

## 2023-11-21 LAB — HEPATITIS B CORE ANTIBODY, TOTAL: Hep B Core Total Ab: NEGATIVE

## 2023-11-21 LAB — HEPATITIS B SURFACE ANTIBODY,QUALITATIVE: Hep B Surface Ab, Qual: NONREACTIVE

## 2023-11-21 LAB — HIV ANTIBODY (ROUTINE TESTING W REFLEX): HIV Screen 4th Generation wRfx: NONREACTIVE

## 2023-11-21 NOTE — Telephone Encounter (Signed)
 sent to GI they obtain [place] [phone]

## 2023-11-28 ENCOUNTER — Telehealth: Payer: Self-pay | Admitting: Neurology

## 2023-11-28 NOTE — Telephone Encounter (Signed)
 I called to go over the lab results with Amanda [name].  Most of her labs look fine.  However, she has very low IgM at baseline.  This concerns me that an anti-CD20 agent (Briumvi or Ocrevus) could lead to a higher than typical risk of infection.  Therefore, I would like her to start Mavenclad instead as it has less effect on IgM over time but still appears to have excellent efficacy.  I will discuss further with a phone call later today or next week

## 2023-12-01 NOTE — Telephone Encounter (Signed)
 I called again 12/01/2023 and got voicemail and LM

## 2023-12-02 NOTE — Telephone Encounter (Signed)
 Amanda [name]

## 2023-12-02 NOTE — Telephone Encounter (Signed)
 Pt has returned call to Dr Godwin Lat from yesterday.

## 2023-12-03 NOTE — Telephone Encounter (Signed)
 Received message from Dr. Godwin Lat: "Due to very low IgM at baseline, she is not a good candidate for Kesimpta.  Therefore I would like to start her on Mavenclad.  Could you please MyChart her a service request form to sign "  I called pt and she spoke with Dr. Godwin Lat yesterday about labs. She is agreeable with starting Mavenclad. Start form emailed to her. She will print and sign/date and email back. Explained [employer] will be reaching out to her after start form faxed to them. She will be on look out for call. Also aware PA will need to be completed via insurance. Once approved, she will be receiving med [facility]. She will call if she has any questions/concerns throughout process.

## 2023-12-09 NOTE — Telephone Encounter (Signed)
Faxed completed/signed Mavenclad start form to [employer] at [phone]. Received fax confirmation.

## 2023-12-09 NOTE — Telephone Encounter (Signed)
 Called pt because I have not received signed form back. She states she emailed but she will try to resend now. Aware I will call back if I do not see it come through. She verbalized understanding.

## 2023-12-10 NOTE — Telephone Encounter (Signed)
 Completed Mavenclad (10 tablets) PA via CMM. Sent to [employer]. Key: [id number]. They may ask for clinical notes. Should have a determination within 3-5 business days.

## 2023-12-11 NOTE — Telephone Encounter (Signed)
 Appeal letter written, marked urgent, printed for Dr. Godwin Lat to review. Should fax with clinical notes, labs, imaging to [place] at [phone].

## 2023-12-11 NOTE — Telephone Encounter (Signed)
 PA for [facility] was denied by [place].

## 2023-12-11 NOTE — Telephone Encounter (Addendum)
 Office note and lab faxed to [employer] [phone]. , fax confirmation received. No imaging was available to fax.

## 2023-12-11 NOTE — Telephone Encounter (Signed)
 Located MRI report from [facility]. Re-faxed, received confirmation.

## 2023-12-11 NOTE — Telephone Encounter (Signed)
 Received fax from [employer] that case already denied and we must appeal, not use fax to send additional clinical info. Resent urgent appeal to appeal fax# [phone]. Received fax confirmation. Waiting on determination.

## 2023-12-15 NOTE — Telephone Encounter (Signed)
 Received fax from [employer] that they are unable to locate provider to process request. I resent with provider info (provider name, address, phone/fax # and TIN/DEA/NPI). Fax: [phone]. Received fax confirmation, waiting on determination.

## 2023-12-22 ENCOUNTER — Inpatient Hospital Stay

## 2023-12-22 ENCOUNTER — Inpatient Hospital Stay: Attending: Hematology and Oncology | Admitting: Hematology and Oncology

## 2023-12-30 NOTE — Telephone Encounter (Signed)
 I called [employer]. This was a 24 minute phone call.  Appeal for Amanda [name] was approved from 12/15/23-12/11/24. Case ID #[id number]. Appeal letter approval mailed to us  on 12/15/23 and will also be faxed to [phone].  I have informed [employer] of this approval.

## 2023-12-30 NOTE — Telephone Encounter (Signed)
 Amanda [name]

## 2024-01-12 NOTE — Telephone Encounter (Signed)
 I received notice from [employer] that they have been unable to reach patient.  I called patient to discuss.  No answer, left a voicemail asking her to call us  back.  If patient calls back, please ask her to call [employer] at [phone].  Please also advise her that her Mavenclad appeal was approved.

## 2024-01-19 NOTE — Telephone Encounter (Signed)
 I called [name] at [facility]. She received Mavenclad on 01/17/2024.   I called patient. She has a meeting with the [employer] nurse tomorrow and plans on starting Mavenclad then. I will check on her next week.

## 2024-01-26 NOTE — Telephone Encounter (Signed)
 I called patient.  She reports that she started [facility] on June 10 and has tolerated it well.  She will let us  know if she has any questions or concerns or has trouble getting her Mavenclad shipped to her next month.  She will keep her follow-up appointment as scheduled with Dr. Godwin Lat on August 27.  She is aware there will be blood work drawn at that appointment.

## 2024-01-26 NOTE — Addendum Note (Signed)
 Addended by: Norina Beath A on: 01/26/2024 02:27 PM   Modules accepted: Orders

## 2024-02-10 ENCOUNTER — Encounter: Payer: Self-pay | Admitting: Neurology

## 2024-02-20 ENCOUNTER — Ambulatory Visit
Admission: RE | Admit: 2024-02-20 | Discharge: 2024-02-20 | Disposition: A | Discharge status: Home / Self Care | Source: Ambulatory Visit | Attending: Neurology | Admitting: Neurology

## 2024-02-20 DIAGNOSIS — R200 Anesthesia of skin: Secondary | ICD-10-CM | POA: Diagnosis not present

## 2024-02-20 DIAGNOSIS — G35 Multiple sclerosis: Secondary | ICD-10-CM | POA: Diagnosis not present

## 2024-02-20 DIAGNOSIS — R269 Unspecified abnormalities of gait and mobility: Secondary | ICD-10-CM

## 2024-02-20 MED ORDER — GADOPICLENOL 0.5 MMOL/ML IV SOLN
10.0000 mL | Freq: Once | INTRAVENOUS | Status: AC | PRN
  Administered 2024-02-20: 10 mL via INTRAVENOUS

## 2024-02-21 ENCOUNTER — Ambulatory Visit: Payer: Self-pay | Admitting: Neurology

## 2024-02-23 ENCOUNTER — Ambulatory Visit
Admission: RE | Admit: 2024-02-23 | Discharge: 2024-02-23 | Disposition: A | Discharge status: Home / Self Care | Source: Ambulatory Visit | Attending: Neurology | Admitting: Neurology

## 2024-02-23 DIAGNOSIS — G35 Multiple sclerosis: Secondary | ICD-10-CM

## 2024-02-23 DIAGNOSIS — R4789 Other speech disturbances: Secondary | ICD-10-CM

## 2024-02-23 DIAGNOSIS — R200 Anesthesia of skin: Secondary | ICD-10-CM

## 2024-02-23 DIAGNOSIS — R269 Unspecified abnormalities of gait and mobility: Secondary | ICD-10-CM

## 2024-02-23 MED ORDER — GADOPICLENOL 0.5 MMOL/ML IV SOLN
10.0000 mL | Freq: Once | INTRAVENOUS | Status: AC | PRN
  Administered 2024-02-23: 10 mL via INTRAVENOUS

## 2024-03-17 ENCOUNTER — Telehealth: Payer: Self-pay | Admitting: Neurology

## 2024-03-17 NOTE — Telephone Encounter (Signed)
Patient called to verify appointment 

## 2024-04-07 ENCOUNTER — Ambulatory Visit: Admitting: Neurology

## 2024-05-10 ENCOUNTER — Other Ambulatory Visit (HOSPITAL_COMMUNITY): Payer: Self-pay | Admitting: Cardiology

## 2024-05-10 MED ORDER — SACUBITRIL-VALSARTAN 97-103 MG PO TABS: 1.0000 | ORAL_TABLET | Freq: Two times a day (BID) | ORAL | 3 refills | Status: AC

## 2024-05-10 MED ORDER — CARVEDILOL 12.5 MG PO TABS: 12.5000 mg | ORAL_TABLET | Freq: Two times a day (BID) | ORAL | 3 refills | Status: DC

## 2024-05-10 MED ORDER — SPIRONOLACTONE 25 MG PO TABS: 25.0000 mg | ORAL_TABLET | Freq: Every day | ORAL | 3 refills | Status: AC

## 2024-05-12 ENCOUNTER — Telehealth: Payer: Self-pay | Admitting: Neurology

## 2024-05-12 NOTE — Telephone Encounter (Signed)
 Called back and could not accept below appt. Accepted 06/14/24 at 2pm with Dr. Vear instead. Pt scheduled by phone room.

## 2024-05-12 NOTE — Telephone Encounter (Signed)
 Dedra from [facility] called to request an earlier appt for Pt , PT has a possible surgery planned on Dec 3 and Pt MD would like for PT to be seen before dec 3 if possible .  Dedra Rushing is  [phone] ext [phone]

## 2024-05-12 NOTE — Telephone Encounter (Addendum)
 Reviewed pt chart. She cx her f/u that was scheduled for 04/07/24 d/t not being able to leave work and r/s for 11/09/24.   I called Carmen back but ext number incorrect. I asked to speak with rep. Spoke w/ Dedra. Offered appt 05/13/24 at 2pm with Dr. Vear. She will call pt and call us  back if pt accepts.   Phone room: please schedule if pt accepts appt. It is on hold currently

## 2024-06-14 ENCOUNTER — Ambulatory Visit: Admitting: Neurology

## 2024-06-14 ENCOUNTER — Encounter: Payer: Self-pay | Admitting: Neurology

## 2024-06-14 VITALS — BP 132/80 | HR 106 | Resp 17

## 2024-06-14 DIAGNOSIS — R269 Unspecified abnormalities of gait and mobility: Secondary | ICD-10-CM

## 2024-06-14 DIAGNOSIS — G35A Relapsing-remitting multiple sclerosis: Secondary | ICD-10-CM | POA: Diagnosis not present

## 2024-06-14 DIAGNOSIS — Z79899 Other long term (current) drug therapy: Secondary | ICD-10-CM | POA: Diagnosis not present

## 2024-06-14 DIAGNOSIS — M62838 Other muscle spasm: Secondary | ICD-10-CM | POA: Diagnosis not present

## 2024-06-14 MED ORDER — LEVETIRACETAM 750 MG PO TABS
750.0000 mg | ORAL_TABLET | Freq: Two times a day (BID) | ORAL | 11 refills | Status: AC
Start: 2024-06-14 — End: ?

## 2024-06-14 NOTE — Progress Notes (Signed)
 [facility]  PATIENT: Amanda [name] DOB: 07-Jun-1981  REFERRING DOCTOR OR PCP: Harlene Gainer, FNP; Therisa Holm, PA SOURCE: Patient, notes from previous neurologist, imaging and lab reports, MRI images personally reviewed.  _________________________________   HISTORICAL  CHIEF COMPLAINT:  Chief Complaint  Patient presents with   Multiple Sclerosis    Rm11, husband present, MS-4 MONTH F/U PER PT: pt stated that her balance, hand dexterity, and speech issues are truly bothersome. Pt had exacerbation 3 days ago     HISTORY OF PRESENT ILLNESS:  Amanda [name] with relapsing remitting multiple sclerosis.   Update 06/14/2024: She did her two months of Mavenclad June/July 2025.   She tolerated the weeks fairly well - had some headaches.     She has noted balance is worse than last visit..   She has some right leg weakness and she had some painful phasic spasms in the foot for several minutes.   She continue to take baclofen  5-10 mg tid bu is often sleepy.  .  She notes a mild right foot drop.  Since her recent relapse in December 2024, she has right flank and dow to leg/foot numbness.     On the left, her toes are numb.   Bladder function is fine.  Vision is sometimes blurry OS.     She has some word finding and mild fluency difficulty.  This also seems new over the last 6 months.   She has more stress with her mom's illness (recent diabetic 'coma' and a vertebral fracture)   She slept poorly some nights including the night before the recent painful phasic spasm.    Co-morbidities:   She has chronic systolic CHF (post-viral) and on Entresto , carvedilol  and was on Jardiance . (For CHF not DM).      MS History: She was diagnsed with MS in 2011.   At the time, her right leg ws giving out on her nad speech was slurred.  Symptoms progressed over the next couple days and she saw her PCP and referred her to Dr. Leafy (Neuro) in [place].   She had MRIs and LP and w  diagnosed with MS.  She was started on Copaxone and then switched to Aubagio .  She tolerated it well   However, insurance is going to stop covering her copay was going to be very high and she stopped around Jan or Feb 2025.   Over the past 2 years, she has had several relapses, some occurring while she was on medication..  Since she stopped Aubagio , she has noted more spasticity and stiffness spasms in the right arm lasting 5 minutes at a time.  These started 07/2022.   She is also noting more numbness in her right foot.   She has had milder toe numbness on the left.   She has had more word finding difficulty and pauses in her speech the last 2 months.    Has 2 paternal cousins with MS - one died of related complications and one is wheelchair bound  Imaging: MRI 02/23/2024 showed e T2/FLAIR hyperintense foci in the cerebral hemispheres, left thalamus, brainstem and cerebellum in a pattern consistent with demyelinating plaque associated with multiple sclerosis. Most of the foci appear to be chronic though 1 small focus in the left frontal lobe enhances after contrast consistent with acute demyelination. Compared to the MRI from 2016, there has been significant progression.   MRI 02/23/2024 cervical spine showed   A T2 hyperintense focus is noted within the pons.  The cervicomedullary junction appears normal.  Paravertebral soft tissue appears normal.  Several T2 hyperintense foci are noted within the spinal cord.  These are located towards the right adjacent to C4-C5, towards the right adjacent to C5-C6, centrally adjacent to C7   MRI 02/23/2024 thoracic spine showed   T2 hyperintense foci noted posterior laterally to the right adjacent to T2, posterior laterally to the left adjacent to T2-T3, to the left adjacent to T5-T6 to the right adjacent to T7-T8 to T8-T9 posterolaterally to the right adjacent to T9   MRI 06/14/2022 showed multiple T2/FLAIR hyperintense foci in the periventricular, juxtacortical and  deep white matter of the cerebral hemispheres in a pattern consistent with MS.  Foci were also noted in the left middle cerebellar peduncle, bilateral cerebellum adjacent to the ventricle, and pons and left midbrain.  None of the foci enhance.  There is a small developmental venous anomaly in the right frontal lobe and the corpus callosal genu.  MRI of the brain 10/29/2021 showed multiple T2 hyperintense foci in the cerebral hemispheres, left pons and midbrain 1 focus in the right corpus callosum enhance after contrast.  It was felt that the number of T2 lesions increased compared to the MRI from January 2021.  MRI of the brain 05/29/2015 showed T2/FLAIR hyperintense foci in the periventricular, juxtacortical and deep white matter of the cerebral hemispheres.  Foci were also noted in the left midbrain and left middle cerebellar peduncle and right cerebellum adjacent to the fourth ventricle.   REVIEW OF SYSTEMS: Constitutional: No fevers, chills, sweats, or change in appetite Eyes: No visual changes, double vision, eye pain Ear, nose and throat: No hearing loss, ear pain, nasal congestion, sore throat Cardiovascular: No chest pain, palpitations  Respiratory:  No shortness of breath at rest or with exertion.   No wheezes GastrointestinaI: No nausea, vomiting, diarrhea, abdominal pain, fecal incontinence Genitourinary:  No dysuria, urinary retention or frequency.  No nocturia. Musculoskeletal:  No neck pain, back pain Integumentary: No rash, pruritus, skin lesions Neurological: as above Psychiatric: No depression at this time.  No anxiety Endocrine: No palpitations, diaphoresis, change in appetite, change in weigh or increased thirst Hematologic/Lymphatic:  No anemia, purpura, petechiae. Allergic/Immunologic: No itchy/runny eyes, nasal congestion, recent allergic reactions, rashes  ALLERGIES: No Known Allergies  HOME MEDICATIONS:  Current Outpatient Medications:    acetaminophen  (TYLENOL )  500 MG tablet, Take 1,000 mg by mouth 2 (two) times daily as needed for mild pain, moderate pain, fever or headache., Disp: , Rfl:    baclofen  (LIORESAL ) 10 MG tablet, 1/2 to 1 po tid, Disp: 90 each, Rfl: 11   carvedilol  (COREG ) 12.5 MG tablet, Take 1 tablet (12.5 mg total) by mouth 2 (two) times daily with a meal., Disp: 180 tablet, Rfl: 3   Cholecalciferol (VITAMIN D -3 PO), Take 1 capsule by mouth in the morning., Disp: , Rfl:    Cladribine, 10 Tabs, (MAVENCLAD, 10 TABS,) 10 MG TBPK, Take 20 mg by mouth daily., Disp: , Rfl:    ferrous sulfate  325 (65 FE) MG tablet, Take 1 tablet (325 mg total) by mouth 2 (two) times daily with a meal., Disp: 60 tablet, Rfl: 3   gabapentin  (NEURONTIN ) 300 MG capsule, Take 1 capsule (300 mg total) by mouth 3 (three) times daily., Disp: 90 capsule, Rfl: 11   sacubitril -valsartan  (ENTRESTO ) 97-103 MG, Take 1 tablet by mouth 2 (two) times daily., Disp: 180 tablet, Rfl: 3   spironolactone  (ALDACTONE ) 25 MG tablet, Take 1 tablet (25 mg total)  by mouth at bedtime., Disp: 90 tablet, Rfl: 3  PAST MEDICAL HISTORY: Past Medical History:  Diagnosis Date   Anemia    BP (high blood pressure)    Chest pain    CHF (congestive heart failure) (HCC)    Edema, lower extremity    History of blood transfusion 08/2017   related to low blood count    Infertility, female    Joint pain    Multiple sclerosis    Pneumonia 12/2017   Shortness of breath    Swallowing difficulty    Vitamin D  deficiency     PAST SURGICAL HISTORY: Past Surgical History:  Procedure Laterality Date   CARDIAC CATHETERIZATION  01/12/2018    FAMILY HISTORY: Family History  Problem Relation Age of Onset   Diabetes Mother    Hypertension Mother    Heart disease Mother    Stroke Mother    Obesity Mother    Diabetes Father    Fibroids Sister    Multiple sclerosis Cousin    Multiple sclerosis Cousin     SOCIAL HISTORY: Social History   Socioeconomic History   Marital status: Married     Spouse name: John   Number of children: Not on file   Years of education: Not on file   Highest education level: Not on file  Occupational History   Occupation: stay at home spouse  Tobacco Use   Smoking status: Never   Smokeless tobacco: Never  Vaping Use   Vaping status: Never Used  Substance and Sexual Activity   Alcohol use: Never   Drug use: Never   Sexual activity: Yes  Other Topics Concern   Not on file  Social History Narrative   Pt lives with husband    Pt not working   Social Drivers of Health   Financial Resource Strain: Medium Risk (04/21/2018)   Overall Financial Resource Strain (CARDIA)    Difficulty of Paying Living Expenses: Somewhat hard  Food Insecurity: Not on file  Transportation Needs: Not on file  Physical Activity: Inactive (04/21/2018)   Exercise Vital Sign    Days of Exercise per Week: 0 days    Minutes of Exercise per Session: 0 min  Stress: No Stress Concern Present (04/21/2018)   [employer] of Occupational [employer] - Occupational Stress Questionnaire    Feeling of Stress : Only a little  Social Connections: Unknown (12/10/2021)   Received from [facility]   Social Network    Social Network: Not on file  Intimate Partner Violence: Unknown (11/15/2021)   Received from [facility]   HITS    Physically Hurt: Not on file    Insult or Talk Down To: Not on file    Threaten Physical Harm: Not on file    Scream or Curse: Not on file       PHYSICAL EXAM  Vitals:   06/14/24 1343 06/14/24 1349  BP: (!) 141/89 132/80  Pulse: (!) 106   Resp: 17   SpO2: 98%     There is no height or weight on file to calculate BMI.  No results found.   Color vision was symmetric  General: The patient is well-developed and well-nourished and in no acute distress  HEENT:  Head is /AT.  Sclera are anicteric.  Funduscopic exam shows normal optic discs and retinal vessels.  Neck: No carotid bruits are noted.  The neck is nontender.  Cardiovascular:  The heart has a regular rate and rhythm with a normal S1 and S2.  There were no murmurs, gallops or rubs.    Skin: Extremities are without rash or  edema.  Musculoskeletal:  Back is nontender  Neurologic Exam  Mental status: The patient is alert and oriented x 3 at the time of the examination. The patient has apparent normal recent and remote memory, with an apparently normal attention span and concentration ability.   She had some word finding difficulties during our conversation..  Cranial nerves: Extraocular movements are full. . There is good facial sensation to soft touch bilaterally.Facial strength is normal.  Trapezius and sternocleidomastoid strength is normal. No dysarthria is noted.  The tongue is midline, and the patient has symmetric elevation of the soft palate. No obvious hearing deficits are noted.  Motor:  Muscle bulk is normal.   Muscle tone is mildly increased in the right leg.  She has 4/5 iliopsoas, ankle extension and toe extension strength and 4+/5 elsewhere in the right leg.  Strength was 4+/5 in the left iliopsoas and 5/5 elsewhere.  Sensory: She has reduced sensation to temperature and touch below the right mid thoracic level all the way down the right leg.  Vibration was more symmetric  Coordination: Cerebellar testing reveals good finger-nose-finger and reduced bilateral heel-to-shin .  Gait and station: Station is normal.   She has a mild right foot drop.  She is unable to do a tandem walk.  The Romberg sign was negative.  Reflexes: Deep tendon reflexes are symmetric and normal in arms but increased in right leg with 2 beats nonsustained clonus at right ankle.        DIAGNOSTIC DATA (LABS, IMAGING, TESTING) - I reviewed patient records, labs, notes, testing and imaging myself where available.  Lab Results  Component Value Date   WBC 4.1 11/18/2023   HGB 11.2 11/18/2023   HCT 35.6 11/18/2023   MCV 89 11/18/2023   PLT 231 11/18/2023      Component Value  Date/Time   NA 142 11/18/2023 1014   K 4.5 11/18/2023 1014   CL 108 (H) 11/18/2023 1014   CO2 22 11/18/2023 1014   GLUCOSE 86 11/18/2023 1014   GLUCOSE 107 (H) 09/26/2023 0933   BUN 12 11/18/2023 1014   CREATININE 0.66 11/18/2023 1014   CALCIUM 9.1 11/18/2023 1014   PROT 6.9 11/18/2023 1014   ALBUMIN 4.1 11/18/2023 1014   AST 16 11/18/2023 1014   ALT 11 11/18/2023 1014   ALKPHOS 40 (L) 11/18/2023 1014   BILITOT 0.2 11/18/2023 1014   GFRNONAA >60 09/26/2023 0933   GFRAA 129 02/08/2020 1448   Lab Results  Component Value Date   CHOL 147 02/08/2020   HDL 40 02/08/2020   LDLCALC 94 02/08/2020   TRIG 62 02/08/2020   CHOLHDL 3.7 02/08/2020   Lab Results  Component Value Date   HGBA1C 5.4 03/28/2023   Lab Results  Component Value Date   VITAMINB12 356 02/08/2020   Lab Results  Component Value Date   TSH 3.250 02/08/2020       ASSESSMENT AND PLAN  Relapsing remitting multiple sclerosis  High risk medication use  Gait disturbance  She completed the first year of Mavenclad - check labs today Having jerks/myoclonus - trial of levetiracetam (baclofen  makes her sleepy) Stay active and exercise - PT for git disturbance and right leg spasticity.  Rtc 6 months sooner if new or worsening neurologic issues.    Gloristine Turrubiates A. Vear, MD, [facility] 06/14/2024, 2:10 PM Certified in Neurology, Clinical Neurophysiology, Sleep Medicine and Neuroimaging  [facility] [address], [address] [place], [place] [address] [phone]

## 2024-06-15 ENCOUNTER — Telehealth: Payer: Self-pay | Admitting: Neurology

## 2024-06-15 NOTE — Telephone Encounter (Signed)
 Referral to Physical therapy Faxed to [facility]  [facility] Phone [phone]

## 2024-06-18 ENCOUNTER — Ambulatory Visit (HOSPITAL_COMMUNITY)
Admission: RE | Admit: 2024-06-18 | Discharge: 2024-06-18 | Disposition: A | Discharge status: Home / Self Care | Source: Ambulatory Visit | Attending: Cardiology | Admitting: Cardiology

## 2024-06-18 ENCOUNTER — Ambulatory Visit (HOSPITAL_COMMUNITY): Payer: Self-pay | Admitting: Cardiology

## 2024-06-18 ENCOUNTER — Encounter (HOSPITAL_COMMUNITY): Payer: Self-pay | Admitting: Cardiology

## 2024-06-18 VITALS — BP 134/82 | HR 89 | Ht 66.0 in | Wt 327.8 lb

## 2024-06-18 DIAGNOSIS — D500 Iron deficiency anemia secondary to blood loss (chronic): Secondary | ICD-10-CM | POA: Insufficient documentation

## 2024-06-18 DIAGNOSIS — I428 Other cardiomyopathies: Secondary | ICD-10-CM | POA: Insufficient documentation

## 2024-06-18 DIAGNOSIS — I5022 Chronic systolic (congestive) heart failure: Secondary | ICD-10-CM | POA: Diagnosis present

## 2024-06-18 DIAGNOSIS — Z79899 Other long term (current) drug therapy: Secondary | ICD-10-CM | POA: Diagnosis not present

## 2024-06-18 DIAGNOSIS — D508 Other iron deficiency anemias: Secondary | ICD-10-CM

## 2024-06-18 DIAGNOSIS — Z6841 Body Mass Index (BMI) 40.0 and over, adult: Secondary | ICD-10-CM | POA: Insufficient documentation

## 2024-06-18 DIAGNOSIS — E66813 Obesity, class 3: Secondary | ICD-10-CM | POA: Insufficient documentation

## 2024-06-18 DIAGNOSIS — G35D Multiple sclerosis, unspecified: Secondary | ICD-10-CM | POA: Insufficient documentation

## 2024-06-18 DIAGNOSIS — D259 Leiomyoma of uterus, unspecified: Secondary | ICD-10-CM | POA: Insufficient documentation

## 2024-06-18 DIAGNOSIS — R079 Chest pain, unspecified: Secondary | ICD-10-CM | POA: Diagnosis not present

## 2024-06-18 LAB — CBC WITH DIFFERENTIAL/PLATELET
Abs Immature Granulocytes: 0.02 K/uL (ref 0.00–0.07)
Basophils Absolute: 0 K/uL (ref 0.0–0.1)
Basophils Relative: 1 %
Eosinophils Absolute: 0.3 K/uL (ref 0.0–0.5)
Eosinophils Relative: 7 %
HCT: 27.2 % — ABNORMAL LOW (ref 36.0–46.0)
Hemoglobin: 7.8 g/dL — ABNORMAL LOW (ref 12.0–15.0)
Immature Granulocytes: 1 %
Lymphocytes Relative: 12 %
Lymphs Abs: 0.5 K/uL — ABNORMAL LOW (ref 0.7–4.0)
MCH: 21.2 pg — ABNORMAL LOW (ref 26.0–34.0)
MCHC: 28.7 g/dL — ABNORMAL LOW (ref 30.0–36.0)
MCV: 73.9 fL — ABNORMAL LOW (ref 80.0–100.0)
Monocytes Absolute: 0.4 K/uL (ref 0.1–1.0)
Monocytes Relative: 10 %
Neutro Abs: 2.9 K/uL (ref 1.7–7.7)
Neutrophils Relative %: 69 %
Platelets: 233 K/uL (ref 150–400)
RBC: 3.68 MIL/uL — ABNORMAL LOW (ref 3.87–5.11)
RDW: 16.9 % — ABNORMAL HIGH (ref 11.5–15.5)
WBC: 4.1 K/uL (ref 4.0–10.5)
nRBC: 0 % (ref 0.0–0.2)

## 2024-06-18 LAB — COMPREHENSIVE METABOLIC PANEL WITH GFR
ALT: 11 U/L (ref 0–44)
AST: 13 U/L — ABNORMAL LOW (ref 15–41)
Albumin: 3.4 g/dL — ABNORMAL LOW (ref 3.5–5.0)
Alkaline Phosphatase: 32 U/L — ABNORMAL LOW (ref 38–126)
Anion gap: 8 (ref 5–15)
BUN: 9 mg/dL (ref 6–20)
CO2: 24 mmol/L (ref 22–32)
Calcium: 8.7 mg/dL — ABNORMAL LOW (ref 8.9–10.3)
Chloride: 104 mmol/L (ref 98–111)
Creatinine, Ser: 0.69 mg/dL (ref 0.44–1.00)
GFR, Estimated: >60 mL/min (ref >60)
Glucose, Bld: 112 mg/dL — ABNORMAL HIGH (ref 70–99)
Potassium: 4 mmol/L (ref 3.5–5.1)
Sodium: 136 mmol/L (ref 135–145)
Total Bilirubin: 0.6 mg/dL (ref 0.0–1.2)
Total Protein: 7 g/dL (ref 6.5–8.1)

## 2024-06-18 LAB — LIPID PANEL
Cholesterol: 134 mg/dL (ref 0–200)
HDL: 45 mg/dL (ref >40)
LDL Cholesterol: 80 mg/dL (ref 0–99)
Total CHOL/HDL Ratio: 3 ratio
Triglycerides: 45 mg/dL (ref <150)
VLDL: 9 mg/dL (ref 0–40)

## 2024-06-18 LAB — BRAIN NATRIURETIC PEPTIDE: B Natriuretic Peptide: 71.3 pg/mL (ref 0.0–100.0)

## 2024-06-18 MED ORDER — CARVEDILOL 12.5 MG PO TABS: 18.7500 mg | ORAL_TABLET | Freq: Two times a day (BID) | ORAL | 3 refills | Status: DC

## 2024-06-18 NOTE — Patient Instructions (Signed)
 CHANGE Carvedilol  to 18.75 mg Twice daily  Labs done today, your results will be available in MyChart, we will contact you for abnormal readings.  Your physician has requested that you have an echocardiogram. Echocardiography is a painless test that uses sound waves to create images of your heart. It provides your doctor with information about the size and shape of your heart and how well your heart's chambers and valves are working. This procedure takes approximately one hour. There are no restrictions for this procedure. Please do NOT wear cologne, perfume, aftershave, or lotions (deodorant is allowed). Please arrive 15 minutes prior to your appointment time.  Please note: We ask at that you not bring children with you during ultrasound (echo/ vascular) testing. Due to room size and safety concerns, children are not allowed in the ultrasound rooms during exams. Our front office staff cannot provide observation of children in our lobby area while testing is being conducted. An adult accompanying a patient to their appointment will only be allowed in the ultrasound room at the discretion of the ultrasound technician under special circumstances. We apologize for any inconvenience.  You have been referred to the HEART CARE PHARMACY TEAM. They will call you to arrange your appointment.  Your physician recommends that you schedule a follow-up appointment in: 3 months.  If you have any questions or concerns before your next appointment please send us  a message through [place] or call our office at [phone].    TO LEAVE A MESSAGE FOR THE NURSE SELECT OPTION 2, PLEASE LEAVE A MESSAGE INCLUDING: YOUR NAME DATE OF BIRTH CALL BACK NUMBER REASON FOR CALL**this is important as we prioritize the call backs  YOU WILL RECEIVE A CALL BACK THE SAME DAY AS LONG AS YOU CALL BEFORE 4:00 PM  At the [facility], you and your health needs are our priority. As part of our continuing mission to  provide you with exceptional heart care, we have created designated Provider Care Teams. These Care Teams include your primary Cardiologist (physician) and Advanced Practice Providers (APPs- Physician Assistants and Nurse Practitioners) who all work together to provide you with the care you need, when you need it.   You may see any of the following providers on your designated Care Team at your next follow up: Dr Toribio Fuel Dr Ezra Shuck Dr. Morene Brownie Greig Mosses, NP Caffie Shed, [place] [facility], [place] Beckey Coe, NP Jordan Lee, NP Ellouise Class, NP Tinnie Redman, PharmD Jaun Bash, PharmD   Please be sure to bring in all your medications bottles to every appointment.    Thank you for choosing [facility]

## 2024-06-20 NOTE — Progress Notes (Signed)
 ID:  [name], DOB 04/19/1981, MRN [id number]   Provider location: [place] [facility] Type of Visit: Established patient   PCP:  Amanda Therisa MATSU, PA  HF Cardiologist:  Dr. Rolan.   Chief complaint: CHF   HPI: Amanda [name] is a 43 y.o. who has a history of multiple sclerosis, anemia due to uterine fibroids, and systolic HF (dx 12/2017).   Admitted to [facility] 5/31-01/13/18  with volume overload and found to have acute systolic heart failure. Echo showed EF 10-15%. HF team consulted. R/LHC completed, which showed no CAD and preserved CO. Cardiac MRI showed no LGE, EF 14%. She was diuresed with IV lasix  and transitioned to lasix  40 mg daily. HF meds were optimized. DC weight: 299 lbs.   Echo in 11/18 showed EF 35% with diffuse hypokinesis.    She has been unable to tolerate even low dose Bidil.   Echo in 4/20 showed EF 40%, mild LVH, normal RV, normal IVC.   Echo in 5/21 showed EF 45%, normal RV.   Echo in 5/22 showed EF 40%, diffuse hypokinesis, normal RV, mild MR.   Echo in 10/23 showed EF 40-45%, global hypokinesis, normal RV.   Echo 11/24 EF 35-40%, global hypokinesis, RV normal, IVC normal.   Today she returns for HF follow up. She had to stop Jardiance , felt sick when she was taking it.  She has episodes of sharp chest pain that will last 1-2 minutes, no trigger (not exertional).  No dyspnea with her usual activities, no lightheadedness or palpitations.  Has new neurologist, Dr. Vear, for her MS.  Weight down 4 lbs. She needs surgery for fibroids, has significant bleeding.   ECG (personally reviewed): NSR, LVH  Labs (5/23): K 3.8, creatinine 0.68 Labs (12/23): K 3.6, creatinine 0.69, hgb 7.4 Labs (8/24): K 3.9, creatinine 0.71, hgbA1c 5.4 Labs (12/24): K 3.9, creatinine 0.66 Labs (4/25): K 4.5, creatinine 0.66   PMH:  1. Multiple sclerosis 2. Uterine fibroids 3. Fe deficiency anemia: Likely related to fibroids.  4. Chronic systolic CHF: Diagnosed in  5/19.  Nonischemic cardiomyopathy.  - Echo (6/19) with EF 10-15%.  - RHC/LHC (6/19): mean RA 5, PA 30/13 mean 22, mean PCWP 9, CI 2.63.  No angiographic CAD.  - Cardiac MRI (6/19): EF 14% with severely dilated LV, mildly dilated RV with mildly decreased systolic function, no myocardial LGE.  - Echo (8/19): EF 30%, diffuse hypokinesis, mildly dilated RV with normal systolic function, PASP 40 mmHg.  - Echo (11/19): EF 35%, diffuse hypokinesis, mildly dilated RV with normal systolic function.  - Echo (4/20): EF 40% with mild LV dilation and mild LVH, normal RV, normal IVC.  - Echo (5/21): EF 45%, mild LVH, normal RV, normal IVC - Echo (5/22): EF 40%, diffuse hypokinesis, normal RV, mild MR. - Echo (10/23): EF 40-45%, global hypokinesis, normal RV.  - Echo (11/24): EF 35-40%, global hypokinesis, RV normal, IVC normal.  5. Sleep study (9/19): No OSA.  6. BPPV 7. COVID-19 12/22  Past Surgical History:  Procedure Laterality Date   CARDIAC CATHETERIZATION  01/12/2018   Current Outpatient Medications  Medication Sig Dispense Refill   acetaminophen  (TYLENOL ) 500 MG tablet Take 1,000 mg by mouth 2 (two) times daily as needed for mild pain, moderate pain, fever or headache.     baclofen  (LIORESAL ) 10 MG tablet 1/2 to 1 po tid 90 each 11   Cholecalciferol (VITAMIN D -3 PO) Take 1 capsule by mouth in the morning.  Cladribine, 10 Tabs, (MAVENCLAD, 10 TABS,) 10 MG TBPK Take 20 mg by mouth daily.     ferrous sulfate  325 (65 FE) MG tablet Take 1 tablet (325 mg total) by mouth 2 (two) times daily with a meal. 60 tablet 3   gabapentin  (NEURONTIN ) 300 MG capsule Take 1 capsule (300 mg total) by mouth 3 (three) times daily. 90 capsule 11   sacubitril -valsartan  (ENTRESTO ) 97-103 MG Take 1 tablet by mouth 2 (two) times daily. 180 tablet 3   spironolactone  (ALDACTONE ) 25 MG tablet Take 1 tablet (25 mg total) by mouth at bedtime. 90 tablet 3   carvedilol  (COREG ) 12.5 MG tablet Take 1.5 tablets (18.75 mg total)  by mouth 2 (two) times daily with a meal. 180 tablet 3   levETIRAcetam (KEPPRA) 750 MG tablet Take 1 tablet (750 mg total) by mouth 2 (two) times daily. (Patient not taking: Reported on 06/18/2024) 60 tablet 11   No current facility-administered medications for this encounter.   Allergies:   Patient has no known allergies.   Social History:  The patient  reports that she has never smoked. She has never used smokeless tobacco. She reports that she does not drink alcohol and does not use drugs.   Family History:  The patient's family history includes Diabetes in her father and mother; Fibroids in her sister; Heart disease in her mother; Hypertension in her mother; Multiple sclerosis in her cousin and cousin; Obesity in her mother; Stroke in her mother.   ROS:  Please see the history of present illness.   All other systems are personally reviewed and negative.   Recent Labs: 06/18/2024: ALT 11; B Natriuretic Peptide 71.3; BUN 9; Creatinine, Ser 0.69; Hemoglobin 7.8; Platelets 233; Potassium 4.0; Sodium 136  Personally reviewed   Wt Readings from Last 3 Encounters:  06/18/24 (!) 148.7 kg (327 lb 12.8 oz)  11/18/23 (!) 149.7 kg (330 lb)  10/14/23 (!) 147.4 kg (325 lb)    BP 134/82   Pulse 89   Ht 5' 6 (1.676 m)   Wt (!) 148.7 kg (327 lb 12.8 oz)   LMP 05/24/2024 (Approximate)   SpO2 99%   BMI 52.91 kg/m   Physical Exam General: NAD Neck: No JVD, no thyromegaly or thyroid  nodule.  Lungs: Clear to auscultation bilaterally with normal respiratory effort. CV: Nondisplaced PMI.  Heart regular S1/S2, no S3/S4, no murmur.  No peripheral edema.  No carotid bruit.  Normal pedal pulses.  Abdomen: Soft, nontender, no hepatosplenomegaly, no distention.  Skin: Intact without lesions or rashes.  Neurologic: Alert and oriented x 3.  Psych: Normal affect. Extremities: No clubbing or cyanosis.  HEENT: Normal.   ASSESSMENT AND PLAN: 1. Chronic systolic CHF: Echo 5/19 with EF 10-15%, moderate MR.  Nonischemic cardiomyopathy with no coronary disease on 6/19 cath. 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 Mother developed CHF in her 87s but no other family members have CHF that she knows of (has several healthy siblings).  She does not drink ETOH or use drugs.  No children, has not been pregnant so not peri-partum CMP.  TSH normal.  RHC 01/12/18 with normal filling pressures and preserved CO. Cardiac MRI 01/12/18 did not show LGE, EF 14%. Repeat echo 11/19 showed EF up to 35%.  Echo in 4/20 showed EF 40%.  Echo in 5/21 showed EF up to 45%.  Echo in 5/22 showed EF 40%.  Echo in 10/23 showed EF 40-45%, global hypokinesis, normal RV.  Echo 11/24 showed EF 35-40%, global hypokinesis, RV  normal, IVC normal.  NYHA class I-II, not volume overloaded on exam.  - Continue Entresto  97/103 bid. BMET/BNP today.  - Increase Coreg  to 18.75 mg bid.  - Continue spironolactone  25 mg daily.  - She has not tolerate Jardiance  or Farxiga .   - She did not tolerate even low dose Bidil.   - EF was out of ICD range on last echo, will arrange for repeeat echo.  - Discussed fetotoxicity of GDMT. She is married but was told she would be unable to conceive. 2. Obesity: Body mass index is 52.91 kg/m. - I will refer to pharmacy clinic to see if she can get GLP-1 agonist.  3.  Fe deficiency anemia: Due to bleeding from uterine fibroids.  - CBC today.  - Hysterectomy has been recommended.  She will be moderate risk for anesthesia but not prohibitive.  4. Chest pain: Pain atypical. ECG OK today. Suspect MSK vs stress-related. 5. MS: Now seeing local neurologist (used to go to [place]).   Follow up in 3 months with APP  I spent 32 minutes reviewing records, interviewing/examining patient, and managing orders.    Signed, Ezra Shuck FNP-BC 06/20/2024  [facility] [address] [facility] [place] [address] [phone] (office) [phone] (fax)

## 2024-06-24 NOTE — Therapy (Signed)
 OUTPATIENT PHYSICAL THERAPY NEURO EVALUATION   Patient Name: Amanda [name] MRN: [id number] DOB:[birth year], 43 y.o., female Today's Date: 06/25/2024   PCP: Alben Therisa MATSU, PA REFERRING PROVIDER: [name], MD  END OF SESSION:  PT End of Session - 06/25/24 1106     Visit Number 1    Number of Visits 17   16 visits plus Eval   Date for Recertification  09/03/24   for scheduling delays   Authorization Type [employer] ([employer] required/pending)    Authorization - Number of Visits --   30 (PT,OT)   PT Start Time 1105    PT Stop Time 1147    PT Time Calculation (min) 42 min    Equipment Utilized During Treatment Gait belt    Activity Tolerance Patient limited by fatigue;Other (comment)   R LE spasms noted end of session   Behavior During Therapy [facility] for tasks assessed/performed          Past Medical History:  Diagnosis Date   Anemia    BP (high blood pressure)    Chest pain    CHF (congestive heart failure) (HCC)    Edema, lower extremity    History of blood transfusion 08/2017   related to low blood count    Infertility, female    Joint pain    Multiple sclerosis    Pneumonia 12/2017   Shortness of breath    Swallowing difficulty    Vitamin D  deficiency    Past Surgical History:  Procedure Laterality Date   CARDIAC CATHETERIZATION  01/12/2018   Patient Active Problem List   Diagnosis Date Noted   Anemia 07/30/2022   Influenza A 07/30/2022   Essential hypertension 02/08/2020   Vitamin D  deficiency 02/08/2020   Depression 02/08/2020   History of anemia-  uterine fibroids 02/08/2020   History of uterine fibroid 02/08/2020   Chronic systolic CHF (congestive heart failure) (HCC) 02/08/2020   At risk for diabetes mellitus 02/08/2020   Acute systolic (congestive) heart failure (HCC) 01/09/2018   Idiopathic cardiomyopathy (HCC) 01/09/2018   Uterine fibroid 09/07/2017   Multiple sclerosis    Class 3 severe obesity with serious comorbidity and body mass index  (BMI) of 50.0 to 59.9 in adult [facility])     ONSET DATE: 06/14/2024  REFERRING DIAG: H64.A (ICD-10-CM) - Relapsing remitting multiple sclerosis R26.9 (ICD-10-CM) - Gait disturbance M62.838 (ICD-10-CM) - Muscle spasm of right leg  THERAPY DIAG:  Other abnormalities of gait and mobility  Muscle weakness (generalized)  Other muscle spasm  Other symptoms and signs involving the nervous system  Other symptoms and signs involving the musculoskeletal system  Rationale for Evaluation and Treatment: Rehabilitation  SUBJECTIVE:  SUBJECTIVE STATEMENT: Pt reports having trouble with her balance and R foot is gone--doesn't have stability; d/t MS.  Pt reports MD said therapy would be good for her to help with balance and R foot stability. Has echocardiogram November 24th d/t CHF.  Also has speech impairments. Pt accompanied by: self  PERTINENT HISTORY: Pt with relapsing remitting MS.  Worsening balance.  R leg weakness (R foot drop) and painful spasms R foot.  R flank down to R leg/foot numbness since Dec 2024 relapse.  L toes numb.  Blurry vision sometimes.  Wording finding difficulty new in past 6 months.  Taking Baclofen  TID (often sleepy).  PMH includes MS, chronic CHF, anemia, high BP, chest pain, LE edema, joint pain, PNA, SOB, swallowing difficulty, Vitamin D  deficiency, cardiac cath.  PAIN:  Are you having pain? No  PRECAUTIONS: Fall  RED FLAGS: None   WEIGHT BEARING RESTRICTIONS: No  FALLS: Has patient fallen in last 6 months? No  LIVING ENVIRONMENT: Lives with: lives with their family (husband) Lives in: House/apartment 1 level house Stairs: level entry Has following equipment at home: Single point cane  PLOF: Modified independent ambulating with SPC in community; uses buggies at stores; no  AD use in home  PATIENT GOALS: Get my balance back and get stability in feet and legs.  OBJECTIVE:  Note: Objective measures were completed at Evaluation unless otherwise noted.  DIAGNOSTIC FINDINGS:  Per MRI Brain 02/23/24: Multiple T2/FLAIR hyperintense foci in the cerebral hemispheres, left thalamus, brainstem and cerebellum in a pattern consistent with demyelinating plaque associated with multiple sclerosis. Most of the foci appear to be chronic though 1 small focus in the left frontal lobe enhances after contrast consistent with acute demyelination. Compared to the MRI from 2016, there has been significant progression.  COGNITION: Overall cognitive status: Within functional limits for tasks assessed   SENSATION: Tingling/numbness R lower leg and foot; decreased light touch R lateral lower leg and foot  COORDINATION: Decreased quality R LE alternating rapid toe taps compared to L LE in sitting Unable to perform R LE heel to shin coordination d/t weakness in sitting (L LE appearing WFL)  EDEMA:  None noted  MUSCLE TONE: RLE: Mild and Hypertonic; no clonus noted R ankle  POSTURE: rounded shoulders and forward head  LOWER EXTREMITY ROM:     Active  Right Eval Left Eval  Hip flexion WFL WNL  Hip extension    Hip abduction    Hip adduction    Hip internal rotation    Hip external rotation    Knee flexion WNL WNL  Knee extension WNL WNL  Ankle dorsiflexion WNL WNL  Ankle plantarflexion WNL WNL  Ankle inversion    Ankle eversion     (Blank rows = not tested)  LOWER EXTREMITY MMT:    MMT Right Eval Left Eval  Hip flexion 3+/5 4+/5  Hip extension    Hip abduction    Hip adduction    Hip internal rotation    Hip external rotation    Knee flexion 4/5 5/5  Knee extension 4-/5 5/5  Ankle dorsiflexion 4/5 4+/5  Ankle plantarflexion At least 3/5 AROM At least 3/5 AROM  Ankle inversion    Ankle eversion    (Blank rows = not tested)  BED MOBILITY:  Pt reports  no issues  TRANSFERS: Sit to stand: Modified independence  Assistive device utilized: None     Stand to sit: Modified independence  Assistive device utilized: None  GAIT: Findings: Gait Characteristics: decreased R LE heel-strike and foot clearance, Distance walked: clinic distances, Assistive device utilized:Single point cane, Level of assistance: SBA, and Comments: decreased R LE foot clearance and decreased gait speed with fatigue; R LE spasm post ambulation on  FUNCTIONAL TESTS:  5 times sit to stand: 24.22 seconds no UE support (increased effort and time to stand last 2 trials) 10 meter walk test: 0.60 m/sec with SPC R UE (16.53 seconds)  PATIENT SURVEYS:  TBA  VITALS: Vitals:   06/25/24 1117  BP: 128/77  Pulse: 99                                                                                                                                TREATMENT DATE: 06/25/24  Therapeutic Exercise (for HEP): Seated March x10 reps B LE's (decreased AROM noted R LE) Seated Long Arc Quad  x5 reps B LE's (limited reps d/t fatigue; decreased AROM noted R LE) Seated Heel Raise x10 reps B LE's (simultaneously)   PATIENT EDUCATION: Education details: Eval results.  POC. HEP (see below for details). Person educated: Patient Education method: Explanation, Demonstration, Verbal cues, and Handouts Education comprehension: verbalized understanding, returned demonstration, verbal cues required, and needs further education  HOME EXERCISE PROGRAM: Access Code: [email or web address] URL: [email or web address] Date: 06/25/2024 Prepared by: Damien Caulk  Exercises - Seated March  - 1 x daily - 5 x weekly - 1-2 sets - 5-10 reps - Seated Long Arc Quad  - 1 x daily - 5 x weekly - 1-2 sets - 5-10 reps - Seated Heel Raise  - 1 x daily - 5 x weekly - 1-2 sets - 5-10 reps  GOALS: Goals reviewed with patient? Yes  SHORT TERM GOALS: Target date: 07/23/2024  Pt will be independent  with initial HEP in order to improve strength and balance in order to decrease fall risk and improve function at home for ADL's. Baseline: Issued 06/25/24 Goal status: INITIAL  2.  Pt will decrease 5 Time Sit to Stand by at least 3 seconds in order to demonstrate clinically significant improvement in LE strength. Baseline: 24.22 seconds on Eval Goal status: INITIAL  3.  Assess FGA and update LTG as needed. Baseline: TBA Goal status: INITIAL   LONG TERM GOALS: Target date: 08/20/2024  Pt will be independent with final HEP in order to improve strength and balance in order to decrease fall risk and improve function at home for ADL's. Baseline: TBA Goal status: INITIAL  2.  Pt will decrease 5 Time Sit to Stand by at least 3 seconds (from STG measurement) in order to demonstrate clinically significant improvement in LE strength. Baseline: 24.22 seconds on Eval Goal status: INITIAL  3.  Pt will increase by at least 0.13 m/s in order to demonstrate clinically significant improvement in community ambulation.  Baseline:  0.60 m/sec with SPC on Eval Goal status: INITIAL  4.  Pt will demonstrate improved R  LE foot clearance for safety with gait. Baseline: decreased R LE foot clearance with fatigue on Eval Goal status: INITIAL  5.  Patient will increase Functional Gait Assessment score to >20/30 as to reduce fall risk and improve dynamic gait safety with community ambulation. Baseline: TBA Goal status: INITIAL   ASSESSMENT:  CLINICAL IMPRESSION: Patient is a 43 y.o. female who was seen today for physical therapy evaluation and treatment for relapsing remitting MS, gait disturbance, and muscle spasm of R leg.  Patient presents with impaired R>L LE weakness, R LE spasms with activity, increased R LE tone, impaired R LE sensation and coordination, balance impairments, and transfer and gait impairments.  Pt with decreased R LE foot clearance and decreased gait speed with fatigue  ambulating; R LE spasm noted post ambulation on .  These impairments are limiting patient from daily activities and community activities.  Evaluation included the following assessment tools: 5 time sit to stand and .  Pt scored 24.22 seconds on the 5 time sit to stand test indicating pt is at increased risk of falls (>15 seconds = increased risk of falls).  Pt scored 0.60 m/sec on the 10 Meter Walk Test indicating pt is a limited community Ambulator an increased fall risk.   Patient will benefit from skilled PT to address noted impairments, improve overall function, and progress towards long term goals.   OBJECTIVE IMPAIRMENTS: Abnormal gait, cardiopulmonary status limiting activity, decreased activity tolerance, decreased balance, decreased coordination, decreased endurance, decreased knowledge of condition, decreased knowledge of use of DME, decreased mobility, difficulty walking, decreased ROM, decreased strength, increased muscle spasms, impaired flexibility, impaired sensation, impaired tone, improper body mechanics, postural dysfunction, and pain.   ACTIVITY LIMITATIONS: carrying, lifting, bending, standing, squatting, stairs, transfers, bathing, and locomotion level  PARTICIPATION LIMITATIONS: cleaning, laundry, shopping, community activity, and occupation  PERSONAL FACTORS: Past/current experiences, Time since onset of injury/illness/exacerbation, and 1-2 comorbidities: MS and CHF are also affecting patient's functional outcome.   REHAB POTENTIAL: Good  CLINICAL DECISION MAKING: Evolving/moderate complexity  EVALUATION COMPLEXITY: Moderate  PLAN:  PT FREQUENCY: 2x/week  PT DURATION: 8 weeks  PLANNED INTERVENTIONS: [id number]- PT Re-evaluation, 97750- Physical Performance Testing, 97110-Therapeutic exercises, 97530- Therapeutic activity, [id number]- Neuromuscular re-education, 97535- Self Care, [id number]- Manual therapy, [id number]- Gait training, [phone]- Orthotic Initial, [phone]-  Orthotic/Prosthetic subsequent, [phone]- Aquatic Therapy, [phone]- Electrical stimulation (manual), [id number]- Ultrasound, [phone] (1-2 muscles), [id number] (3+ muscles)- Dry Needling, Patient/Family education, Balance training, Stair training, Taping, Joint mobilization, Spinal mobilization, Compression bandaging, DME instructions, Cryotherapy, Moist heat, and Biofeedback  PLAN FOR NEXT SESSION: Assess FGA.  Review and update HEP.  Aerobic tolerance, strengthening, balance, coordination, stretching, improving R LE foot clearance with gait.   Damien Caulk, PT 06/25/2024, 4:03 PM

## 2024-06-25 ENCOUNTER — Other Ambulatory Visit: Payer: Self-pay

## 2024-06-25 ENCOUNTER — Ambulatory Visit: Attending: Neurology | Admitting: Physical Therapy

## 2024-06-25 ENCOUNTER — Encounter: Payer: Self-pay | Admitting: Physical Therapy

## 2024-06-25 VITALS — BP 128/77 | HR 99

## 2024-06-25 DIAGNOSIS — M62838 Other muscle spasm: Secondary | ICD-10-CM | POA: Diagnosis present

## 2024-06-25 DIAGNOSIS — R269 Unspecified abnormalities of gait and mobility: Secondary | ICD-10-CM | POA: Insufficient documentation

## 2024-06-25 DIAGNOSIS — G35A Relapsing-remitting multiple sclerosis: Secondary | ICD-10-CM | POA: Insufficient documentation

## 2024-06-25 DIAGNOSIS — M6281 Muscle weakness (generalized): Secondary | ICD-10-CM | POA: Diagnosis present

## 2024-06-25 DIAGNOSIS — R2689 Other abnormalities of gait and mobility: Secondary | ICD-10-CM | POA: Diagnosis present

## 2024-06-25 DIAGNOSIS — R29898 Other symptoms and signs involving the musculoskeletal system: Secondary | ICD-10-CM | POA: Diagnosis present

## 2024-06-25 DIAGNOSIS — R29818 Other symptoms and signs involving the nervous system: Secondary | ICD-10-CM | POA: Insufficient documentation

## 2024-06-29 ENCOUNTER — Encounter: Payer: Self-pay | Admitting: Physical Therapy

## 2024-06-29 ENCOUNTER — Ambulatory Visit: Admitting: Physical Therapy

## 2024-06-29 VITALS — BP 120/73 | HR 89

## 2024-06-29 DIAGNOSIS — R29898 Other symptoms and signs involving the musculoskeletal system: Secondary | ICD-10-CM

## 2024-06-29 DIAGNOSIS — R2689 Other abnormalities of gait and mobility: Secondary | ICD-10-CM

## 2024-06-29 DIAGNOSIS — R29818 Other symptoms and signs involving the nervous system: Secondary | ICD-10-CM

## 2024-06-29 DIAGNOSIS — M6281 Muscle weakness (generalized): Secondary | ICD-10-CM

## 2024-06-29 NOTE — Therapy (Signed)
 OUTPATIENT PHYSICAL THERAPY NEURO TREATMENT   Patient Name: Amanda [name] MRN: [id number] DOB:04-04-1981, 43 y.o., female Today's Date: 06/29/2024   PCP: Alben Therisa MATSU, PA REFERRING PROVIDER: [name], MD  END OF SESSION:  PT End of Session - 06/29/24 0932     Visit Number 2    Number of Visits 17   16 visits plus Eval   Date for Recertification  09/03/24   for scheduling delays   Authorization Type [employer]    Authorization - Visit Number 2    Authorization - Number of Visits 30   30 (PT,OT)   PT Start Time 0931    PT Stop Time 1016    PT Time Calculation (min) 45 min    Equipment Utilized During Treatment Gait belt    Activity Tolerance Patient limited by fatigue    Behavior During Therapy WFL for tasks assessed/performed          Past Medical History:  Diagnosis Date   Anemia    BP (high blood pressure)    Chest pain    CHF (congestive heart failure) (HCC)    Edema, lower extremity    History of blood transfusion 08/2017   related to low blood count    Infertility, female    Joint pain    Multiple sclerosis    Pneumonia 12/2017   Shortness of breath    Swallowing difficulty    Vitamin D  deficiency    Past Surgical History:  Procedure Laterality Date   CARDIAC CATHETERIZATION  01/12/2018   Patient Active Problem List   Diagnosis Date Noted   Anemia 07/30/2022   Influenza A 07/30/2022   Essential hypertension 02/08/2020   Vitamin D  deficiency 02/08/2020   Depression 02/08/2020   History of anemia-  uterine fibroids 02/08/2020   History of uterine fibroid 02/08/2020   Chronic systolic CHF (congestive heart failure) (HCC) 02/08/2020   At risk for diabetes mellitus 02/08/2020   Acute systolic (congestive) heart failure (HCC) 01/09/2018   Idiopathic cardiomyopathy (HCC) 01/09/2018   Uterine fibroid 09/07/2017   Multiple sclerosis    Class 3 severe obesity with serious comorbidity and body mass index (BMI) of 50.0 to 59.9 in adult [facility])      ONSET DATE: 06/14/2024  REFERRING DIAG: H64.A (ICD-10-CM) - Relapsing remitting multiple sclerosis R26.9 (ICD-10-CM) - Gait disturbance M62.838 (ICD-10-CM) - Muscle spasm of right leg  THERAPY DIAG:  Other abnormalities of gait and mobility  Muscle weakness (generalized)  Other symptoms and signs involving the nervous system  Other symptoms and signs involving the musculoskeletal system  Rationale for Evaluation and Treatment: Rehabilitation  SUBJECTIVE:  SUBJECTIVE STATEMENT: Pt arrived ambulating with SPC.  Pt reports having a lot of R foot spasms Friday night after therapy evaluation and also Sunday night (walked long distance at church).  No recent falls.  Did HEP 1x (Friday) with husband. Pt accompanied by: self  PERTINENT HISTORY: Pt with relapsing remitting MS.  Worsening balance.  R leg weakness (R foot drop) and painful spasms R foot.  R flank down to R leg/foot numbness since Dec 2024 relapse.  L toes numb.  Blurry vision sometimes.  Wording finding difficulty new in past 6 months.  Taking Baclofen  TID (often sleepy).  PMH includes MS, chronic CHF, anemia, high BP, chest pain, LE edema, joint pain, PNA, SOB, swallowing difficulty, Vitamin D  deficiency, cardiac cath.  PAIN:  Are you having pain? No  PRECAUTIONS: Fall  RED FLAGS: None   WEIGHT BEARING RESTRICTIONS: No  FALLS: Has patient fallen in last 6 months? No  LIVING ENVIRONMENT: Lives with: lives with their family (husband) Lives in: House/apartment 1 level house Stairs: level entry Has following equipment at home: Single point cane  PLOF: Modified independent ambulating with SPC in community; uses buggies at stores; no AD use in home  PATIENT GOALS: Get my balance back and get stability in feet and  legs.  OBJECTIVE:  Note: Objective measures were completed at Evaluation unless otherwise noted.  DIAGNOSTIC FINDINGS:  Per MRI Brain 02/23/24: Multiple T2/FLAIR hyperintense foci in the cerebral hemispheres, left thalamus, brainstem and cerebellum in a pattern consistent with demyelinating plaque associated with multiple sclerosis. Most of the foci appear to be chronic though 1 small focus in the left frontal lobe enhances after contrast consistent with acute demyelination. Compared to the MRI from 2016, there has been significant progression.  COGNITION: Overall cognitive status: Within functional limits for tasks assessed   SENSATION: Tingling/numbness R lower leg and foot; decreased light touch R lateral lower leg and foot  COORDINATION: Decreased quality R LE alternating rapid toe taps compared to L LE in sitting Unable to perform R LE heel to shin coordination d/t weakness in sitting (L LE appearing WFL)  EDEMA:  None noted  MUSCLE TONE: RLE: Mild and Hypertonic; no clonus noted R ankle  POSTURE: rounded shoulders and forward head  LOWER EXTREMITY ROM:     Active  Right Eval Left Eval  Hip flexion WFL WNL  Hip extension    Hip abduction    Hip adduction    Hip internal rotation    Hip external rotation    Knee flexion WNL WNL  Knee extension WNL WNL  Ankle dorsiflexion WNL WNL  Ankle plantarflexion WNL WNL  Ankle inversion    Ankle eversion     (Blank rows = not tested)  LOWER EXTREMITY MMT:    MMT Right Eval Left Eval  Hip flexion 3+/5 4+/5  Hip extension    Hip abduction    Hip adduction    Hip internal rotation    Hip external rotation    Knee flexion 4/5 5/5  Knee extension 4-/5 5/5  Ankle dorsiflexion 4/5 4+/5  Ankle plantarflexion At least 3/5 AROM At least 3/5 AROM  Ankle inversion    Ankle eversion    (Blank rows = not tested)  BED MOBILITY:  Pt reports no issues  TRANSFERS: Sit to stand: Modified independence  Assistive device  utilized: None     Stand to sit: Modified independence  Assistive device utilized: None      GAIT: Findings: Gait Characteristics: decreased R  LE heel-strike and foot clearance, Distance walked: clinic distances, Assistive device utilized:Single point cane, Level of assistance: SBA, and Comments: decreased R LE foot clearance and decreased gait speed with fatigue; R LE spasm post ambulation on  FUNCTIONAL TESTS:  5 times sit to stand: 24.22 seconds no UE support (increased effort and time to stand last 2 trials) 10 meter walk test: 0.60 m/sec with SPC R UE (16.53 seconds) FGA: 11/30 on 06/29/24  FUNCTIONAL GAIT ASSESSMENT  Date: 06/29/24 (with use of SPC R UE) Score  GAIT LEVEL SURFACE Instructions: Walk at your normal speed from here to the next mark (6 m) [20 ft]. (1) Moderate impairment-Walks 6 m (20 ft), slow speed, abnormal gait pattern, evidence for imbalance, or deviates 25.4 - 38.1 cm (10 -15 in) outside of the 30.48-cm (12-in) walkway width. Requires more than 7 seconds to ambulate 6 m (20 ft).11.47 seconds with SPC  2.   CHANGE IN GAIT SPEED Instructions: Begin walking at your normal pace (for 1.5 m [5 ft]). When I tell you "go," walk as fast as you can (for 1.5 m [5 ft]). When I tell you "slow," walk as slowly as you can (for 1.5 m [5 ft]. (1) Moderate impairment - Makes only minor adjustments to walking speed, or accomplishes a change in speed with significant gait deviations, deviates 25.4 -38.1 cm (10 -15 in) outside the 30.48-cm (12-in) walkway width, or changes speed but loses balance but is able to recover and continue walking.  3.    GAIT WITH HORIZONTAL HEAD TURNS Instructions: Walk from here to the next mark 6 m (20 ft) away. Begin walking at your normal pace. Keep walking straight; after 3 steps, turn your head to the right and keep walking straight while looking to the right. After 3 more steps, turn your head to the left and keep walking straight while looking left.  Continue alternating looking right and left. (1) Moderate impairment - Performs head turns with moderate change in gait velocity, slows down, deviates 25.4 -38.1 cm (10 -15 in) outside 30.48-cm (12-in) walkway width but recovers, can continue to walk.  4.   GAIT WITH VERTICAL HEAD TURNS Instructions: Walk from here to the next mark (6 m [20 ft]). Begin walking at your normal pace. Keep walking straight; after 3 steps, tip your head up and keep walking straight while looking up. After 3 more steps, tip your head down, keep walking straight while looking down. Continue  alternating looking up and down every 3 steps until you have completed 2 repetitions in each direction. (2) Mild impairment - Performs task with slight change in gait velocity (eg, minor disruption to smooth gait path), deviates 15.24 -25.4 cm (6 -10 in) outside 30.48-cm (12-in) walkway width or uses assistive device.  5.  GAIT AND PIVOT TURN Instructions: Begin with walking at your normal pace. When I tell you, "turn and stop," turn as quickly as you can to face the opposite direction and stop. (2) Mild impairment - Pivot turns safely in 3 seconds and stops with no loss of balance, or pivot turns safely within 3 seconds and stops with mild imbalance, requires small steps to catch balance  6.   STEP OVER OBSTACLE Instructions: Begin walking at your normal speed. When you come to the shoe box, step over it, not around it, and keep walking. (1) Moderate impairment - Is able to step over one shoe box (11.43 cm [4.5 in] total height) but must slow down and adjust steps  to clear box safely. May require verbal cueing.  7.   GAIT WITH NARROW BASE OF SUPPORT Instructions: Walk on the floor with arms folded across the chest, feet aligned heel to toe in tandem for a distance of 3.6 m [12 ft]. The number of steps taken in a straight line are counted for a maximum of 10 steps. (0) Severe impairment - Ambulates less than 4 steps heel to toe or cannot  perform without assistance.  8.   GAIT WITH EYES CLOSED Instructions: Walk at your normal speed from here to the next mark (6 m [20 ft]) with your eyes closed. (1) Moderate impairment - Walks 6 m (20 ft), slow speed, abnormal gait pattern, evidence for imbalance, deviates 25.4 -38.1 cm (10 -15 in) outside 30.48-cm (12-in) walkway width. Requires more than 9 seconds to ambulate 6 m (20 ft). 19.56 seconds with SPC  9.   AMBULATING BACKWARDS Instructions: Walk backwards until I tell you to stop (1) Moderate impairment - Walks 6 m (20 ft), slow speed, abnormal gait pattern, evidence for imbalance, deviates 25.4 -38.1 cm (10 -15 in) outside 30.48-cm (12-in) walkway width.26.63 seconds  10. STEPS Instructions: Walk up these stairs as you would at home (ie, using the rail if necessary). At the top turn around and walk down. (1) Moderate impairment-Two feet to a stair; must use rail.  Alternating pattern ascending with R UE support on railing: step to pattern with B UE support on railing  Total 11/30   Interpretation of scores: Non-Specific Older Adults Cutoff Score: <=22/30 = risk of falls Parkinson's Disease Cutoff score <15/30= fall risk (Hoehn & Yahr 1-4)  Minimally Clinically Important Difference (MCID)  Stroke (acute, subacute, and chronic) = MDC: 4.2 points Vestibular (acute) = MDC: 6 points Community Dwelling Older Adults =  MCID: 4 points Parkinson's Disease  =  MDC: 4.3 points  (Academy of Neu[employer]ic [employer] (nd). Functional Gait Assessment. Retrieved from https://www.neuropt.org/docs/default-source/cpgs/core-outcome-measures/function-gait-assessment-pocket-guide-proof9-(2).pdf?sfvrsn=b68f35043_0.)   PATIENT SURVEYS:  TBA                                                                                                                              TREATMENT DATE: 06/29/24  Self Care: BP and HR taken in sitting at rest beginning of session (see below for details). Vitals:    06/29/24 0939  BP: 120/73  Pulse: 89  Pt education (see pt education below for details)  Therapeutic Activity: FGA: 11/30 (use of SPC; pt requiring intermittent sitting rest breaks for pacing/fatigue; see above for additional details) Toe taps to 6 inch step in // bars: x10 reps L LE; no UE support x10 reps R LE; L UE support Notes: increased time and effort to perform with R LE and clear step; use of visual feedback (mirror) and vc's; CGA for safety   PATIENT EDUCATION: Education details: Monitor BP at home daily (has BP machine at home; takes BP medication).  Continue HEP.  Energy conservation, pacing, activity  modification, and safety at home. Person educated: Patient Education method: Explanation and Verbal cues Education comprehension: verbalized understanding, verbal cues required, and needs further education  HOME EXERCISE PROGRAM: Access Code: [email or web address] URL: [email or web address] Date: 06/25/2024 Prepared by: Damien Caulk  Exercises - Seated March  - 1 x daily - 5 x weekly - 1-2 sets - 5-10 reps - Seated Long Arc Quad  - 1 x daily - 5 x weekly - 1-2 sets - 5-10 reps - Seated Heel Raise  - 1 x daily - 5 x weekly - 1-2 sets - 5-10 reps  GOALS: Goals reviewed with patient? Yes  SHORT TERM GOALS: Target date: 07/23/2024  Pt will be independent with initial HEP in order to improve strength and balance in order to decrease fall risk and improve function at home for ADL's. Baseline: Issued 06/25/24 Goal status: INITIAL  2.  Pt will decrease 5 Time Sit to Stand by at least 3 seconds in order to demonstrate clinically significant improvement in LE strength. Baseline: 24.22 seconds on Eval Goal status: INITIAL  3.  Assess FGA and update LTG as needed. Baseline: 11/30 on 06/29/24 Goal status: INITIAL   LONG TERM GOALS: Target date: 08/20/2024  Pt will be independent with final HEP in order to improve strength and balance in order to decrease fall risk and  improve function at home for ADL's. Baseline: TBA Goal status: INITIAL  2.  Pt will decrease 5 Time Sit to Stand by at least 3 seconds (from STG measurement) in order to demonstrate clinically significant improvement in LE strength. Baseline: 24.22 seconds on Eval Goal status: INITIAL  3.  Pt will increase by at least 0.13 m/s in order to demonstrate clinically significant improvement in community ambulation.  Baseline:  0.60 m/sec with SPC on Eval Goal status: INITIAL  4.  Pt will demonstrate improved R LE foot clearance for safety with gait. Baseline: decreased R LE foot clearance with fatigue on Eval Goal status: INITIAL  5.  Patient will increase Functional Gait Assessment score to > or = 20/30 as to reduce fall risk and improve dynamic gait safety with community ambulation. Baseline: 11/30 on 06/29/24 Goal status: INITIAL   ASSESSMENT:  CLINICAL IMPRESSION: Patient was seen today for physical therapy follow-up treatment to address FGA and ambulation.  Focused session on assessing FGA and increasing LE step clearance for ambulation.  Pt scored 11/30 on FGA indicating pt is at increased risk of falls.  Patient continues to be limited by R>L LE weakness and balance.  They demonstrate improvement in activity tolerance during today's session with pacing and rest breaks; no R LE spasms noted during session.  They would continue to benefit from skilled PT to address impairments as noted and progress towards long term goals.  OBJECTIVE IMPAIRMENTS: Abnormal gait, cardiopulmonary status limiting activity, decreased activity tolerance, decreased balance, decreased coordination, decreased endurance, decreased knowledge of condition, decreased knowledge of use of DME, decreased mobility, difficulty walking, decreased ROM, decreased strength, increased muscle spasms, impaired flexibility, impaired sensation, impaired tone, improper body mechanics, postural dysfunction, and pain.   ACTIVITY  LIMITATIONS: carrying, lifting, bending, standing, squatting, stairs, transfers, bathing, and locomotion level  PARTICIPATION LIMITATIONS: cleaning, laundry, shopping, community activity, and occupation  PERSONAL FACTORS: Past/current experiences, Time since onset of injury/illness/exacerbation, and 1-2 comorbidities: MS and CHF are also affecting patient's functional outcome.   REHAB POTENTIAL: Good  CLINICAL DECISION MAKING: Evolving/moderate complexity  EVALUATION COMPLEXITY: Moderate  PLAN:  PT FREQUENCY: 2x/week  PT DURATION: 8  weeks  PLANNED INTERVENTIONS: [id number]- PT Re-evaluation, 97750- Physical Performance Testing, 97110-Therapeutic exercises, 97530- Therapeutic activity, [id number]- Neuromuscular re-education, 97535- Self Care, [id number]- Manual therapy, [id number]- Gait training, [phone]- Orthotic Initial, [phone]- Orthotic/Prosthetic subsequent, [phone]- Aquatic Therapy, [phone]- Electrical stimulation (manual), [id number]- Ultrasound, [phone] (1-2 muscles), [id number] (3+ muscles)- Dry Needling, Patient/Family education, Balance training, Stair training, Taping, Joint mobilization, Spinal mobilization, Compression bandaging, DME instructions, Cryotherapy, Moist heat, and Biofeedback  PLAN FOR NEXT SESSION: Review and update HEP.  Aerobic tolerance, strengthening, balance, coordination, stretching, improving R LE foot clearance with gait.   Damien Caulk, PT 06/29/2024, 8:29 PM

## 2024-07-02 ENCOUNTER — Encounter: Payer: Self-pay | Admitting: Physical Therapy

## 2024-07-02 ENCOUNTER — Ambulatory Visit: Admitting: Physical Therapy

## 2024-07-02 VITALS — BP 133/89 | HR 97

## 2024-07-02 DIAGNOSIS — R2689 Other abnormalities of gait and mobility: Secondary | ICD-10-CM | POA: Diagnosis not present

## 2024-07-02 DIAGNOSIS — R29818 Other symptoms and signs involving the nervous system: Secondary | ICD-10-CM

## 2024-07-02 DIAGNOSIS — R29898 Other symptoms and signs involving the musculoskeletal system: Secondary | ICD-10-CM

## 2024-07-02 DIAGNOSIS — M6281 Muscle weakness (generalized): Secondary | ICD-10-CM

## 2024-07-02 NOTE — Therapy (Signed)
 OUTPATIENT PHYSICAL THERAPY NEURO TREATMENT   Patient Name: Amanda [name] MRN: [id number] DOB:01-16-1981, 43 y.o., female Today's Date: 07/02/2024   PCP: Alben Therisa MATSU, PA REFERRING PROVIDER: [name], MD  END OF SESSION:  PT End of Session - 07/02/24 0934     Visit Number 3    Number of Visits 17   16 visits plus Eval   Date for Recertification  09/03/24   for scheduling delays   Authorization Type [employer]    Authorization - Visit Number 3    Authorization - Number of Visits 30   30 (PT,OT)   PT Start Time 0933    PT Stop Time 1016    PT Time Calculation (min) 43 min    Equipment Utilized During Treatment Gait belt    Activity Tolerance Patient limited by fatigue;No increased pain;Patient tolerated treatment well    Behavior During Therapy [facility] for tasks assessed/performed          Past Medical History:  Diagnosis Date   Anemia    BP (high blood pressure)    Chest pain    CHF (congestive heart failure) (HCC)    Edema, lower extremity    History of blood transfusion 08/2017   related to low blood count    Infertility, female    Joint pain    Multiple sclerosis    Pneumonia 12/2017   Shortness of breath    Swallowing difficulty    Vitamin D  deficiency    Past Surgical History:  Procedure Laterality Date   CARDIAC CATHETERIZATION  01/12/2018   Patient Active Problem List   Diagnosis Date Noted   Anemia 07/30/2022   Influenza A 07/30/2022   Essential hypertension 02/08/2020   Vitamin D  deficiency 02/08/2020   Depression 02/08/2020   History of anemia-  uterine fibroids 02/08/2020   History of uterine fibroid 02/08/2020   Chronic systolic CHF (congestive heart failure) (HCC) 02/08/2020   At risk for diabetes mellitus 02/08/2020   Acute systolic (congestive) heart failure (HCC) 01/09/2018   Idiopathic cardiomyopathy (HCC) 01/09/2018   Uterine fibroid 09/07/2017   Multiple sclerosis    Class 3 severe obesity with serious comorbidity and body mass  index (BMI) of 50.0 to 59.9 in adult [facility])     ONSET DATE: 06/14/2024  REFERRING DIAG: H64.A (ICD-10-CM) - Relapsing remitting multiple sclerosis R26.9 (ICD-10-CM) - Gait disturbance M62.838 (ICD-10-CM) - Muscle spasm of right leg  THERAPY DIAG:  Other abnormalities of gait and mobility  Muscle weakness (generalized)  Other symptoms and signs involving the nervous system  Other symptoms and signs involving the musculoskeletal system  Rationale for Evaluation and Treatment: Rehabilitation  SUBJECTIVE:  SUBJECTIVE STATEMENT: Pt reports increased R LE pain today d/t weather (heaviness and burning)--typical symptoms for cold and rainy weather.  No recent falls.  Pt arrived ambulating with SPC.  No acute changes reported.  Doing HEP (will not do HEP if walking that day so she doesn't over do it). Pt accompanied by: self  PERTINENT HISTORY: Pt with relapsing remitting MS.  Worsening balance.  R leg weakness (R foot drop) and painful spasms R foot.  R flank down to R leg/foot numbness since Dec 2024 relapse.  L toes numb.  Blurry vision sometimes.  Wording finding difficulty new in past 6 months.  Taking Baclofen  TID (often sleepy).  PMH includes MS, chronic CHF, anemia, high BP, chest pain, LE edema, joint pain, PNA, SOB, swallowing difficulty, Vitamin D  deficiency, cardiac cath.  PAIN:  Are you having pain? 8/10 R LE pain (aching d/t weather)  PRECAUTIONS: Fall  RED FLAGS: None   WEIGHT BEARING RESTRICTIONS: No  FALLS: Has patient fallen in last 6 months? No  LIVING ENVIRONMENT: Lives with: lives with their family (husband) Lives in: House/apartment 1 level house Stairs: level entry Has following equipment at home: Single point cane  PLOF: Modified independent ambulating with SPC in community;  uses buggies at stores; no AD use in home  PATIENT GOALS: Get my balance back and get stability in feet and legs.  OBJECTIVE:  Note: Objective measures were completed at Evaluation unless otherwise noted.  DIAGNOSTIC FINDINGS:  Per MRI Brain 02/23/24: Multiple T2/FLAIR hyperintense foci in the cerebral hemispheres, left thalamus, brainstem and cerebellum in a pattern consistent with demyelinating plaque associated with multiple sclerosis. Most of the foci appear to be chronic though 1 small focus in the left frontal lobe enhances after contrast consistent with acute demyelination. Compared to the MRI from 2016, there has been significant progression.  COGNITION: Overall cognitive status: Within functional limits for tasks assessed   SENSATION: Tingling/numbness R lower leg and foot; decreased light touch R lateral lower leg and foot  COORDINATION: Decreased quality R LE alternating rapid toe taps compared to L LE in sitting Unable to perform R LE heel to shin coordination d/t weakness in sitting (L LE appearing WFL)  EDEMA:  None noted  MUSCLE TONE: RLE: Mild and Hypertonic; no clonus noted R ankle  POSTURE: rounded shoulders and forward head  LOWER EXTREMITY ROM:     Active  Right Eval Left Eval  Hip flexion WFL WNL  Hip extension    Hip abduction    Hip adduction    Hip internal rotation    Hip external rotation    Knee flexion WNL WNL  Knee extension WNL WNL  Ankle dorsiflexion WNL WNL  Ankle plantarflexion WNL WNL  Ankle inversion    Ankle eversion     (Blank rows = not tested)  LOWER EXTREMITY MMT:    MMT Right Eval Left Eval  Hip flexion 3+/5 4+/5  Hip extension    Hip abduction    Hip adduction    Hip internal rotation    Hip external rotation    Knee flexion 4/5 5/5  Knee extension 4-/5 5/5  Ankle dorsiflexion 4/5 4+/5  Ankle plantarflexion At least 3/5 AROM At least 3/5 AROM  Ankle inversion    Ankle eversion    (Blank rows = not  tested)  BED MOBILITY:  Pt reports no issues  TRANSFERS: Sit to stand: Modified independence  Assistive device utilized: None     Stand to sit: Modified independence  Assistive device utilized: None      GAIT: Findings: Gait Characteristics: decreased R LE heel-strike and foot clearance, Distance walked: clinic distances, Assistive device utilized:Single point cane, Level of assistance: SBA, and Comments: decreased R LE foot clearance and decreased gait speed with fatigue; R LE spasm post ambulation on  FUNCTIONAL TESTS:  5 times sit to stand: 24.22 seconds no UE support (increased effort and time to stand last 2 trials) 10 meter walk test: 0.60 m/sec with SPC R UE (16.53 seconds) FGA: 11/30 on 06/29/24  FUNCTIONAL GAIT ASSESSMENT  Date: 06/29/24 (with use of SPC R UE) Score  GAIT LEVEL SURFACE Instructions: Walk at your normal speed from here to the next mark (6 m) [20 ft]. (1) Moderate impairment-Walks 6 m (20 ft), slow speed, abnormal gait pattern, evidence for imbalance, or deviates 25.4 - 38.1 cm (10 -15 in) outside of the 30.48-cm (12-in) walkway width. Requires more than 7 seconds to ambulate 6 m (20 ft).11.47 seconds with SPC  2.   CHANGE IN GAIT SPEED Instructions: Begin walking at your normal pace (for 1.5 m [5 ft]). When I tell you "go," walk as fast as you can (for 1.5 m [5 ft]). When I tell you "slow," walk as slowly as you can (for 1.5 m [5 ft]. (1) Moderate impairment - Makes only minor adjustments to walking speed, or accomplishes a change in speed with significant gait deviations, deviates 25.4 -38.1 cm (10 -15 in) outside the 30.48-cm (12-in) walkway width, or changes speed but loses balance but is able to recover and continue walking.  3.    GAIT WITH HORIZONTAL HEAD TURNS Instructions: Walk from here to the next mark 6 m (20 ft) away. Begin walking at your normal pace. Keep walking straight; after 3 steps, turn your head to the right and keep walking straight while  looking to the right. After 3 more steps, turn your head to the left and keep walking straight while looking left. Continue alternating looking right and left. (1) Moderate impairment - Performs head turns with moderate change in gait velocity, slows down, deviates 25.4 -38.1 cm (10 -15 in) outside 30.48-cm (12-in) walkway width but recovers, can continue to walk.  4.   GAIT WITH VERTICAL HEAD TURNS Instructions: Walk from here to the next mark (6 m [20 ft]). Begin walking at your normal pace. Keep walking straight; after 3 steps, tip your head up and keep walking straight while looking up. After 3 more steps, tip your head down, keep walking straight while looking down. Continue  alternating looking up and down every 3 steps until you have completed 2 repetitions in each direction. (2) Mild impairment - Performs task with slight change in gait velocity (eg, minor disruption to smooth gait path), deviates 15.24 -25.4 cm (6 -10 in) outside 30.48-cm (12-in) walkway width or uses assistive device.  5.  GAIT AND PIVOT TURN Instructions: Begin with walking at your normal pace. When I tell you, "turn and stop," turn as quickly as you can to face the opposite direction and stop. (2) Mild impairment - Pivot turns safely in 3 seconds and stops with no loss of balance, or pivot turns safely within 3 seconds and stops with mild imbalance, requires small steps to catch balance  6.   STEP OVER OBSTACLE Instructions: Begin walking at your normal speed. When you come to the shoe box, step over it, not around it, and keep walking. (1) Moderate impairment - Is able to step over one  shoe box (11.43 cm [4.5 in] total height) but must slow down and adjust steps to clear box safely. May require verbal cueing.  7.   GAIT WITH NARROW BASE OF SUPPORT Instructions: Walk on the floor with arms folded across the chest, feet aligned heel to toe in tandem for a distance of 3.6 m [12 ft]. The number of steps taken in a straight line are  counted for a maximum of 10 steps. (0) Severe impairment - Ambulates less than 4 steps heel to toe or cannot perform without assistance.  8.   GAIT WITH EYES CLOSED Instructions: Walk at your normal speed from here to the next mark (6 m [20 ft]) with your eyes closed. (1) Moderate impairment - Walks 6 m (20 ft), slow speed, abnormal gait pattern, evidence for imbalance, deviates 25.4 -38.1 cm (10 -15 in) outside 30.48-cm (12-in) walkway width. Requires more than 9 seconds to ambulate 6 m (20 ft). 19.56 seconds with SPC  9.   AMBULATING BACKWARDS Instructions: Walk backwards until I tell you to stop (1) Moderate impairment - Walks 6 m (20 ft), slow speed, abnormal gait pattern, evidence for imbalance, deviates 25.4 -38.1 cm (10 -15 in) outside 30.48-cm (12-in) walkway width.26.63 seconds  10. STEPS Instructions: Walk up these stairs as you would at home (ie, using the rail if necessary). At the top turn around and walk down. (1) Moderate impairment-Two feet to a stair; must use rail.  Alternating pattern ascending with R UE support on railing: step to pattern with B UE support on railing  Total 11/30   Interpretation of scores: Non-Specific Older Adults Cutoff Score: <=22/30 = risk of falls Parkinson's Disease Cutoff score <15/30= fall risk (Hoehn & Yahr 1-4)  Minimally Clinically Important Difference (MCID)  Stroke (acute, subacute, and chronic) = MDC: 4.2 points Vestibular (acute) = MDC: 6 points Community Dwelling Older Adults =  MCID: 4 points Parkinson's Disease  =  MDC: 4.3 points  (Academy of Neurologic Physical Therapy (nd). Functional Gait Assessment. Retrieved from https://www.neuropt.org/docs/default-source/cpgs/core-outcome-measures/function-gait-assessment-pocket-guide-proof9-(2).pdf?sfvrsn=b87f35043_0.)   PATIENT SURVEYS:  TBA                                                                                                                              TREATMENT DATE:  07/02/24  Self Care: BP and HR taken in sitting at rest beginning of session (see below for details). Vitals:   07/02/24 0939  BP: 133/89  Pulse: 97    Therapeutic Activity: Toe taps at stairs to 6 inch step: x10 reps x2 sets R LE (L UE support) x10 reps x2 sets L LE (R UE support) Notes: pt fatigued last 3 reps of 2nd set of R toe taps; SBA Side stepping in // bars on 9 foot compliant surface (purple) balance beam (L/R = 1 rep): x2 reps with B UE support Notes: decreased speed 1st trial compared to 2nd trial d/t pt concentrating on trying to limit UE support; improved confidence  noted 2nd trial; CGA for safety Sit to stands on Airex from 21 inch height mat table (stabilized chair in front of pt to improve pt's confidence/safety but pt only touching chair briefly 2 times; pt pushing off mat table with B UE's): x3 reps; CGA Static standing balance on Airex (stabilized chair in front of pt to improve pt's confidence/safety): x1 minute x3 sets; no UE support; feet shoulder width apart; initially min assist progressing to CGA   PATIENT EDUCATION: Education details: Continue HEP.  Energy conservation, pacing, activity modification, and safety at home. Person educated: Patient Education method: Explanation and Verbal cues Education comprehension: verbalized understanding, verbal cues required, and needs further education  HOME EXERCISE PROGRAM: Access Code: [email or web address] URL: [email or web address] Date: 06/25/2024 Prepared by: Damien Caulk  Exercises - Seated March  - 1 x daily - 5 x weekly - 1-2 sets - 5-10 reps - Seated Long Arc Quad  - 1 x daily - 5 x weekly - 1-2 sets - 5-10 reps - Seated Heel Raise  - 1 x daily - 5 x weekly - 1-2 sets - 5-10 reps  GOALS: Goals reviewed with patient? Yes  SHORT TERM GOALS: Target date: 07/23/2024  Pt will be independent with initial HEP in order to improve strength and balance in order to decrease fall risk and improve function  at home for ADL's. Baseline: Issued 06/25/24 Goal status: INITIAL  2.  Pt will decrease 5 Time Sit to Stand by at least 3 seconds in order to demonstrate clinically significant improvement in LE strength. Baseline: 24.22 seconds on Eval Goal status: INITIAL  3.  Assess FGA and update LTG as needed. Baseline: 11/30 on 06/29/24 Goal status: INITIAL   LONG TERM GOALS: Target date: 08/20/2024  Pt will be independent with final HEP in order to improve strength and balance in order to decrease fall risk and improve function at home for ADL's. Baseline: TBA Goal status: INITIAL  2.  Pt will decrease 5 Time Sit to Stand by at least 3 seconds (from STG measurement) in order to demonstrate clinically significant improvement in LE strength. Baseline: 24.22 seconds on Eval Goal status: INITIAL  3.  Pt will increase by at least 0.13 m/s in order to demonstrate clinically significant improvement in community ambulation.  Baseline:  0.60 m/sec with SPC on Eval Goal status: INITIAL  4.  Pt will demonstrate improved R LE foot clearance for safety with gait. Baseline: decreased R LE foot clearance with fatigue on Eval Goal status: INITIAL  5.  Patient will increase Functional Gait Assessment score to > or = 20/30 as to reduce fall risk and improve dynamic gait safety with community ambulation. Baseline: 11/30 on 06/29/24 Goal status: INITIAL   ASSESSMENT:  CLINICAL IMPRESSION: Patient was seen today for physical therapy treatment to address balance and gait.  Focused session on improving foot clearance for gait (with toe taps to 6 inch step) and balance (on compliant surface).  Pt appeared anxious with balance activities (requiring extra time to perform) but improved confidence and quality of activities noted with repetition.  Pt provided pacing, rest breaks, and activity modification during session (pt appeared to have good tolerance to therapy session with these adjustments and pt reported  therapy session wasn't too hard but also not too easy).  Patient continues to be limited by R>L LE weakness and balance. They would continue to benefit from skilled PT to address impairments as noted and progress towards long term goals.   OBJECTIVE IMPAIRMENTS: Abnormal  gait, cardiopulmonary status limiting activity, decreased activity tolerance, decreased balance, decreased coordination, decreased endurance, decreased knowledge of condition, decreased knowledge of use of DME, decreased mobility, difficulty walking, decreased ROM, decreased strength, increased muscle spasms, impaired flexibility, impaired sensation, impaired tone, improper body mechanics, postural dysfunction, and pain.   ACTIVITY LIMITATIONS: carrying, lifting, bending, standing, squatting, stairs, transfers, bathing, and locomotion level  PARTICIPATION LIMITATIONS: cleaning, laundry, shopping, community activity, and occupation  PERSONAL FACTORS: Past/current experiences, Time since onset of injury/illness/exacerbation, and 1-2 comorbidities: MS and CHF are also affecting patient's functional outcome.   REHAB POTENTIAL: Good  CLINICAL DECISION MAKING: Evolving/moderate complexity  EVALUATION COMPLEXITY: Moderate  PLAN:  PT FREQUENCY: 2x/week  PT DURATION: 8 weeks  PLANNED INTERVENTIONS: 97164- PT Re-evaluation, 97750- Physical Performance Testing, 97110-Therapeutic exercises, 97530- Therapeutic activity, [id number]- Neuromuscular re-education, 97535- Self Care, [id number]- Manual therapy, [id number]- Gait training, [phone]- Orthotic Initial, [phone]- Orthotic/Prosthetic subsequent, [phone]- Aquatic Therapy, [phone]- Electrical stimulation (manual), [id number]- Ultrasound, 79439 (1-2 muscles), 20561 (3+ muscles)- Dry Needling, Patient/Family education, Balance training, Stair training, Taping, Joint mobilization, Spinal mobilization, Compression bandaging, DME instructions, Cryotherapy, Moist heat, and Biofeedback  PLAN FOR NEXT SESSION: Review and  update HEP.  Aerobic tolerance, strengthening, balance, coordination, stretching, improving R LE foot clearance with gait.   Damien Caulk, PT 07/02/2024, 10:21 AM

## 2024-07-05 ENCOUNTER — Ambulatory Visit (HOSPITAL_COMMUNITY): Payer: Self-pay | Admitting: Cardiology

## 2024-07-05 ENCOUNTER — Ambulatory Visit (HOSPITAL_COMMUNITY)
Admission: RE | Admit: 2024-07-05 | Discharge: 2024-07-05 | Disposition: A | Discharge status: Home / Self Care | Source: Ambulatory Visit | Attending: Cardiology | Admitting: Cardiology

## 2024-07-05 ENCOUNTER — Ambulatory Visit (HOSPITAL_COMMUNITY)
Admission: RE | Admit: 2024-07-05 | Discharge: 2024-07-05 | Disposition: A | Source: Ambulatory Visit | Attending: Cardiology

## 2024-07-05 DIAGNOSIS — I110 Hypertensive heart disease with heart failure: Secondary | ICD-10-CM | POA: Diagnosis not present

## 2024-07-05 DIAGNOSIS — I5022 Chronic systolic (congestive) heart failure: Secondary | ICD-10-CM

## 2024-07-05 DIAGNOSIS — D508 Other iron deficiency anemias: Secondary | ICD-10-CM

## 2024-07-05 LAB — ECHOCARDIOGRAM COMPLETE
AR max vel: 2.38 cm2
AV Area VTI: 2.68 cm2
AV Area mean vel: 2.82 cm2
AV Mean grad: 6 mmHg
AV Peak grad: 14.1 mmHg
Ao pk vel: 1.88 m/s
Area-P 1/2: 6.9 cm2
Calc EF: 46.2 %
S' Lateral: 4.7 cm
Single Plane A2C EF: 46.4 %
Single Plane A4C EF: 47.4 %

## 2024-07-05 LAB — CBC
HCT: 25 % — ABNORMAL LOW (ref 36.0–46.0)
Hemoglobin: 7 g/dL — ABNORMAL LOW (ref 12.0–15.0)
MCH: 20.1 pg — ABNORMAL LOW (ref 26.0–34.0)
MCHC: 20.1 g/dL — ABNORMAL LOW (ref 30.0–36.0)
MCV: 71.6 fL — ABNORMAL LOW (ref 80.0–100.0)
Platelets: 226 K/uL (ref 150–400)
RBC: 3.49 MIL/uL — ABNORMAL LOW (ref 3.87–5.11)
RDW: 17.3 % — ABNORMAL HIGH (ref 11.5–15.5)
WBC: 3.1 K/uL — ABNORMAL LOW (ref 4.0–10.5)

## 2024-07-05 LAB — IRON AND TIBC
Iron: 17 ug/dL — ABNORMAL LOW (ref 28–170)
Saturation Ratios: 4 % — ABNORMAL LOW (ref 10.4–31.8)
TIBC: 409 ug/dL (ref 250–450)
UIBC: 392 ug/dL

## 2024-07-05 LAB — FERRITIN: Ferritin: <3 ng/mL — ABNORMAL LOW (ref 11–307)

## 2024-07-06 ENCOUNTER — Other Ambulatory Visit (HOSPITAL_COMMUNITY): Payer: Self-pay | Admitting: *Deleted

## 2024-07-06 ENCOUNTER — Telehealth (HOSPITAL_COMMUNITY): Payer: Self-pay | Admitting: Cardiology

## 2024-07-06 ENCOUNTER — Other Ambulatory Visit (HOSPITAL_COMMUNITY): Payer: Self-pay | Admitting: Cardiology

## 2024-07-06 DIAGNOSIS — D508 Other iron deficiency anemias: Secondary | ICD-10-CM

## 2024-07-06 DIAGNOSIS — I5022 Chronic systolic (congestive) heart failure: Secondary | ICD-10-CM

## 2024-07-06 NOTE — Telephone Encounter (Signed)
 Patient referred to infusion pharmacy team for ambulatory infusion of IV iron.  Insurance - Insurance Underwriter of care - Site of care: [facility] Dx code - D50.8,I50.22 IV Iron Therapy - Venofer 300 mg x 3. Pt will also receive 1 unit prbcs at this appointment. Infusion appointments - Scheduling team will schedule patient as soon as possible.    Thank you,  Norton Blush, PharmD, [facility]

## 2024-07-07 ENCOUNTER — Other Ambulatory Visit (HOSPITAL_COMMUNITY): Payer: Self-pay | Admitting: Cardiology

## 2024-07-07 ENCOUNTER — Telehealth (HOSPITAL_COMMUNITY): Payer: Self-pay | Admitting: Pharmacy Technician

## 2024-07-07 NOTE — Addendum Note (Signed)
 Addended byBETHA FERMIN PLANAS A on: 07/07/2024 03:40 PM   Modules accepted: Orders

## 2024-07-07 NOTE — Telephone Encounter (Signed)
 Auth Submission: NO AUTH NEEDED Site of care: [facility] Payer: [employer] Medication & CPT/J Code(s) submitted: Venofer (Iron Sucrose) J1756 Diagnosis Code: D50.8, I50.22  Route of submission (phone, fax, portal):  Phone # Fax # Auth type: Buy/Bill HB Units/visits requested: 300mg  x 3 doses Reference number:  Approval from: 07/06/24 to 08/11/24    Dagoberto Armour, CPhT [facility] Phone: [phone] 07/07/2024

## 2024-07-12 ENCOUNTER — Telehealth: Payer: Self-pay | Admitting: Physical Therapy

## 2024-07-12 ENCOUNTER — Ambulatory Visit: Admitting: Physical Therapy

## 2024-07-12 NOTE — Telephone Encounter (Signed)
 Patient Name: Amanda [name] MRN: [id number] DOB:[birth year], 43 y.o., female Today's Date: 07/12/2024  Per chart review, pt noted to have recent low Hgb with plan for transfusion/infusion scheduled for tomorrow.  Therapist called pt prior to AM appointment to see how pt was feeling (pt did not appear to be feeling up to therapy today).  Today's PT appointment cancelled (discussed with pt).  Next scheduled PT appointment reviewed with pt (07/16/24 at 2pm).   Damien Caulk, PT 07/12/2024, 9:48 AM

## 2024-07-13 ENCOUNTER — Ambulatory Visit: Admitting: Physical Therapy

## 2024-07-13 ENCOUNTER — Encounter (HOSPITAL_COMMUNITY)
Admission: RE | Admit: 2024-07-13 | Discharge: 2024-07-13 | Disposition: A | Source: Ambulatory Visit | Attending: Cardiology

## 2024-07-13 VITALS — BP 129/81 | HR 81 | Temp 98.2°F | Resp 16

## 2024-07-13 DIAGNOSIS — D508 Other iron deficiency anemias: Secondary | ICD-10-CM | POA: Diagnosis present

## 2024-07-13 DIAGNOSIS — I5022 Chronic systolic (congestive) heart failure: Secondary | ICD-10-CM | POA: Diagnosis present

## 2024-07-13 LAB — PREPARE RBC (CROSSMATCH)

## 2024-07-13 MED ORDER — IRON SUCROSE 300 MG IVPB - SIMPLE MED
300.0000 mg | Freq: Once | Status: AC
  Administered 2024-07-13: 300 mg via INTRAVENOUS
  Filled 2024-07-13: qty 300

## 2024-07-13 MED ORDER — SODIUM CHLORIDE 0.9% IV SOLUTION: Freq: Once | INTRAVENOUS | Status: DC

## 2024-07-14 LAB — TYPE AND SCREEN
ABO/RH(D): A POS
Antibody Screen: NEGATIVE
Unit division: 0

## 2024-07-14 LAB — BPAM RBC
Blood Product Expiration Date: 202512202359
ISSUE DATE / TIME: 202512021008
Unit Type and Rh: 6200

## 2024-07-16 ENCOUNTER — Ambulatory Visit: Admitting: Physical Therapy

## 2024-07-16 ENCOUNTER — Encounter: Payer: Self-pay | Admitting: Physical Therapy

## 2024-07-16 VITALS — BP 143/84 | HR 88

## 2024-07-16 DIAGNOSIS — M6281 Muscle weakness (generalized): Secondary | ICD-10-CM | POA: Diagnosis present

## 2024-07-16 DIAGNOSIS — R29818 Other symptoms and signs involving the nervous system: Secondary | ICD-10-CM | POA: Insufficient documentation

## 2024-07-16 DIAGNOSIS — R2689 Other abnormalities of gait and mobility: Secondary | ICD-10-CM | POA: Diagnosis present

## 2024-07-16 DIAGNOSIS — M62838 Other muscle spasm: Secondary | ICD-10-CM | POA: Insufficient documentation

## 2024-07-16 DIAGNOSIS — R29898 Other symptoms and signs involving the musculoskeletal system: Secondary | ICD-10-CM | POA: Insufficient documentation

## 2024-07-16 NOTE — Therapy (Signed)
 OUTPATIENT PHYSICAL THERAPY NEURO TREATMENT   Patient Name: ALYSEN SMYLIE MRN: [id number] DOB:[birth year], 43 y.o., female Today's Date: 07/16/2024   PCP: Alben Therisa MATSU, PA REFERRING PROVIDER: [name], MD  END OF SESSION:  PT End of Session - 07/16/24 1415     Visit Number 4    Number of Visits 17   16 visits plus Eval   Date for Recertification  09/03/24   for scheduling delays   Authorization Type [employer]    Authorization - Visit Number 4    Authorization - Number of Visits 30   30 (PT,OT)   PT Start Time 1402    PT Stop Time 1447    PT Time Calculation (min) 45 min    Equipment Utilized During Treatment Gait belt    Activity Tolerance Patient limited by fatigue;No increased pain;Patient tolerated treatment well    Behavior During Therapy [facility] for tasks assessed/performed          Past Medical History:  Diagnosis Date   Anemia    BP (high blood pressure)    Chest pain    CHF (congestive heart failure) (HCC)    Edema, lower extremity    History of blood transfusion 08/2017   related to low blood count    Infertility, female    Joint pain    Multiple sclerosis    Pneumonia 12/2017   Shortness of breath    Swallowing difficulty    Vitamin D  deficiency    Past Surgical History:  Procedure Laterality Date   CARDIAC CATHETERIZATION  01/12/2018   Patient Active Problem List   Diagnosis Date Noted   Other iron  deficiency anemias 07/06/2024   Anemia 07/30/2022   Influenza A 07/30/2022   Essential hypertension 02/08/2020   Vitamin D  deficiency 02/08/2020   Depression 02/08/2020   History of anemia-  uterine fibroids 02/08/2020   History of uterine fibroid 02/08/2020   Chronic systolic CHF (congestive heart failure) (HCC) 02/08/2020   At risk for diabetes mellitus 02/08/2020   Acute systolic (congestive) heart failure (HCC) 01/09/2018   Idiopathic cardiomyopathy (HCC) 01/09/2018   Uterine fibroid 09/07/2017   Multiple sclerosis    Class 3 severe  obesity with serious comorbidity and body mass index (BMI) of 50.0 to 59.9 in adult [facility])     ONSET DATE: 06/14/2024  REFERRING DIAG: H64.A (ICD-10-CM) - Relapsing remitting multiple sclerosis R26.9 (ICD-10-CM) - Gait disturbance M62.838 (ICD-10-CM) - Muscle spasm of right leg  THERAPY DIAG:  Other abnormalities of gait and mobility  Muscle weakness (generalized)  Other symptoms and signs involving the nervous system  Other symptoms and signs involving the musculoskeletal system  Other muscle spasm  Rationale for Evaluation and Treatment: Rehabilitation  SUBJECTIVE:  SUBJECTIVE STATEMENT: She had her infusion and transfusion and these have helped.  She goes back Tuesday for another iron  infusion as well as the week after this.  Ongoing R LE pain today d/t weather (heaviness and burning)--typical symptoms for cold and rainy weather.  No recent falls.  Pt arrived ambulating with SPC.  No acute changes reported.  HEP is going pretty good. Pt accompanied by: self  PERTINENT HISTORY: Pt with relapsing remitting MS.  Worsening balance.  R leg weakness (R foot drop) and painful spasms R foot.  R flank down to R leg/foot numbness since Dec 2024 relapse.  L toes numb.  Blurry vision sometimes.  Wording finding difficulty new in past 6 months.  Taking Baclofen  TID (often sleepy).  PMH includes MS, chronic CHF, anemia, high BP, chest pain, LE edema, joint pain, PNA, SOB, swallowing difficulty, Vitamin D  deficiency, cardiac cath.  PAIN:  Are you having pain? 5/10 R LE pain (aching d/t weather)  PRECAUTIONS: Fall  RED FLAGS: None   WEIGHT BEARING RESTRICTIONS: No  FALLS: Has patient fallen in last 6 months? No  LIVING ENVIRONMENT: Lives with: lives with their family (husband) Lives in: House/apartment  1 level house Stairs: level entry Has following equipment at home: Single point cane  PLOF: Modified independent ambulating with SPC in community; uses buggies at stores; no AD use in home  PATIENT GOALS: Get my balance back and get stability in feet and legs.  OBJECTIVE:  Note: Objective measures were completed at Evaluation unless otherwise noted.  DIAGNOSTIC FINDINGS:  Per MRI Brain 02/23/24: Multiple T2/FLAIR hyperintense foci in the cerebral hemispheres, left thalamus, brainstem and cerebellum in a pattern consistent with demyelinating plaque associated with multiple sclerosis. Most of the foci appear to be chronic though 1 small focus in the left frontal lobe enhances after contrast consistent with acute demyelination. Compared to the MRI from 2016, there has been significant progression.  COGNITION: Overall cognitive status: Within functional limits for tasks assessed   SENSATION: Tingling/numbness R lower leg and foot; decreased light touch R lateral lower leg and foot  COORDINATION: Decreased quality R LE alternating rapid toe taps compared to L LE in sitting Unable to perform R LE heel to shin coordination d/t weakness in sitting (L LE appearing WFL)  EDEMA:  None noted  MUSCLE TONE: RLE: Mild and Hypertonic; no clonus noted R ankle  POSTURE: rounded shoulders and forward head  LOWER EXTREMITY ROM:     Active  Right Eval Left Eval  Hip flexion WFL WNL  Hip extension    Hip abduction    Hip adduction    Hip internal rotation    Hip external rotation    Knee flexion WNL WNL  Knee extension WNL WNL  Ankle dorsiflexion WNL WNL  Ankle plantarflexion WNL WNL  Ankle inversion    Ankle eversion     (Blank rows = not tested)  LOWER EXTREMITY MMT:    MMT Right Eval Left Eval  Hip flexion 3+/5 4+/5  Hip extension    Hip abduction    Hip adduction    Hip internal rotation    Hip external rotation    Knee flexion 4/5 5/5  Knee extension 4-/5 5/5  Ankle  dorsiflexion 4/5 4+/5  Ankle plantarflexion At least 3/5 AROM At least 3/5 AROM  Ankle inversion    Ankle eversion    (Blank rows = not tested)  BED MOBILITY:  Pt reports no issues  TRANSFERS: Sit to stand: Modified independence  Assistive device utilized: None     Stand to sit: Modified independence  Assistive device utilized: None      GAIT: Findings: Gait Characteristics: decreased R LE heel-strike and foot clearance, Distance walked: clinic distances, Assistive device utilized:Single point cane, Level of assistance: SBA, and Comments: decreased R LE foot clearance and decreased gait speed with fatigue; R LE spasm post ambulation on  FUNCTIONAL TESTS:  5 times sit to stand: 24.22 seconds no UE support (increased effort and time to stand last 2 trials) 10 meter walk test: 0.60 m/sec with SPC R UE (16.53 seconds) FGA: 11/30 on 06/29/24  FUNCTIONAL GAIT ASSESSMENT  Date: 06/29/24 (with use of SPC R UE) Score  GAIT LEVEL SURFACE Instructions: Walk at your normal speed from here to the next mark (6 m) [20 ft]. (1) Moderate impairment-Walks 6 m (20 ft), slow speed, abnormal gait pattern, evidence for imbalance, or deviates 25.4 - 38.1 cm (10 -15 in) outside of the 30.48-cm (12-in) walkway width. Requires more than 7 seconds to ambulate 6 m (20 ft).11.47 seconds with SPC  2.   CHANGE IN GAIT SPEED Instructions: Begin walking at your normal pace (for 1.5 m [5 ft]). When I tell you "go," walk as fast as you can (for 1.5 m [5 ft]). When I tell you "slow," walk as slowly as you can (for 1.5 m [5 ft]. (1) Moderate impairment - Makes only minor adjustments to walking speed, or accomplishes a change in speed with significant gait deviations, deviates 25.4 -38.1 cm (10 -15 in) outside the 30.48-cm (12-in) walkway width, or changes speed but loses balance but is able to recover and continue walking.  3.    GAIT WITH HORIZONTAL HEAD TURNS Instructions: Walk from here to the next mark 6 m (20 ft)  away. Begin walking at your normal pace. Keep walking straight; after 3 steps, turn your head to the right and keep walking straight while looking to the right. After 3 more steps, turn your head to the left and keep walking straight while looking left. Continue alternating looking right and left. (1) Moderate impairment - Performs head turns with moderate change in gait velocity, slows down, deviates 25.4 -38.1 cm (10 -15 in) outside 30.48-cm (12-in) walkway width but recovers, can continue to walk.  4.   GAIT WITH VERTICAL HEAD TURNS Instructions: Walk from here to the next mark (6 m [20 ft]). Begin walking at your normal pace. Keep walking straight; after 3 steps, tip your head up and keep walking straight while looking up. After 3 more steps, tip your head down, keep walking straight while looking down. Continue  alternating looking up and down every 3 steps until you have completed 2 repetitions in each direction. (2) Mild impairment - Performs task with slight change in gait velocity (eg, minor disruption to smooth gait path), deviates 15.24 -25.4 cm (6 -10 in) outside 30.48-cm (12-in) walkway width or uses assistive device.  5.  GAIT AND PIVOT TURN Instructions: Begin with walking at your normal pace. When I tell you, "turn and stop," turn as quickly as you can to face the opposite direction and stop. (2) Mild impairment - Pivot turns safely in 3 seconds and stops with no loss of balance, or pivot turns safely within 3 seconds and stops with mild imbalance, requires small steps to catch balance  6.   STEP OVER OBSTACLE Instructions: Begin walking at your normal speed. When you come to the shoe box, step over it, not around  it, and keep walking. (1) Moderate impairment - Is able to step over one shoe box (11.43 cm [4.5 in] total height) but must slow down and adjust steps to clear box safely. May require verbal cueing.  7.   GAIT WITH NARROW BASE OF SUPPORT Instructions: Walk on the floor with arms  folded across the chest, feet aligned heel to toe in tandem for a distance of 3.6 m [12 ft]. The number of steps taken in a straight line are counted for a maximum of 10 steps. (0) Severe impairment - Ambulates less than 4 steps heel to toe or cannot perform without assistance.  8.   GAIT WITH EYES CLOSED Instructions: Walk at your normal speed from here to the next mark (6 m [20 ft]) with your eyes closed. (1) Moderate impairment - Walks 6 m (20 ft), slow speed, abnormal gait pattern, evidence for imbalance, deviates 25.4 -38.1 cm (10 -15 in) outside 30.48-cm (12-in) walkway width. Requires more than 9 seconds to ambulate 6 m (20 ft). 19.56 seconds with SPC  9.   AMBULATING BACKWARDS Instructions: Walk backwards until I tell you to stop (1) Moderate impairment - Walks 6 m (20 ft), slow speed, abnormal gait pattern, evidence for imbalance, deviates 25.4 -38.1 cm (10 -15 in) outside 30.48-cm (12-in) walkway width.26.63 seconds  10. STEPS Instructions: Walk up these stairs as you would at home (ie, using the rail if necessary). At the top turn around and walk down. (1) Moderate impairment-Two feet to a stair; must use rail.  Alternating pattern ascending with R UE support on railing: step to pattern with B UE support on railing  Total 11/30   Interpretation of scores: Non-Specific Older Adults Cutoff Score: <=22/30 = risk of falls Parkinson's Disease Cutoff score <15/30= fall risk (Hoehn & Yahr 1-4)  Minimally Clinically Important Difference (MCID)  Stroke (acute, subacute, and chronic) = MDC: 4.2 points Vestibular (acute) = MDC: 6 points Community Dwelling Older Adults =  MCID: 4 points Parkinson's Disease  =  MDC: 4.3 points  (Academy of Neurologic Physical Therapy (nd). Functional Gait Assessment. Retrieved from https://www.neuropt.org/docs/default-source/cpgs/core-outcome-measures/function-gait-assessment-pocket-guide-proof9-(2).pdf?sfvrsn=b60f35043_0.)   PATIENT SURVEYS:  TBA                                                                                                                               TREATMENT DATE: 07/16/24  Self Care: L BP and HR and R SpO2 taken in sitting prior to interventions: Vitals:   07/16/24 1411  BP: (!) 143/84  Pulse: 88  SpO2: 99%   NMR: -Forward 4-8 hurdles w/ unilateral UE support 6x66ft alt LE leading > repeated laterally 6x91ft -A/P tilt board holding level w/ light BUE support > alt midline 8 cone taps x20 -Lateral tilt board weight shifting x3 minutes w/ BUE support working into upright posture  TA: -SciFit progressing to level 3.0 over 8 minutes using BUE/BLE for global strengthening, endurance and large amplitude reciprocal mobility.  PATIENT EDUCATION: Education details: Continue HEP.   Person educated: Patient Education method: Explanation and Verbal cues Education comprehension: verbalized understanding, verbal cues required, and needs further education  HOME EXERCISE PROGRAM: Access Code: [email or web address] URL: [email or web address] Date: 06/25/2024 Prepared by: Damien Caulk  Exercises - Seated March  - 1 x daily - 5 x weekly - 1-2 sets - 5-10 reps - Seated Long Arc Quad  - 1 x daily - 5 x weekly - 1-2 sets - 5-10 reps - Seated Heel Raise  - 1 x daily - 5 x weekly - 1-2 sets - 5-10 reps  GOALS: Goals reviewed with patient? Yes  SHORT TERM GOALS: Target date: 07/23/2024  Pt will be independent with initial HEP in order to improve strength and balance in order to decrease fall risk and improve function at home for ADL's. Baseline: Issued 06/25/24 Goal status: INITIAL  2.  Pt will decrease 5 Time Sit to Stand by at least 3 seconds in order to demonstrate clinically significant improvement in LE strength. Baseline: 24.22 seconds on Eval Goal status: INITIAL  3.  Assess FGA and update LTG as needed. Baseline: 11/30 on 06/29/24 Goal status: INITIAL   LONG TERM GOALS: Target date: 08/20/2024  Pt will be  independent with final HEP in order to improve strength and balance in order to decrease fall risk and improve function at home for ADL's. Baseline: TBA Goal status: INITIAL  2.  Pt will decrease 5 Time Sit to Stand by at least 3 seconds (from STG measurement) in order to demonstrate clinically significant improvement in LE strength. Baseline: 24.22 seconds on Eval Goal status: INITIAL  3.  Pt will increase by at least 0.13 m/s in order to demonstrate clinically significant improvement in community ambulation.  Baseline:  0.60 m/sec with SPC on Eval Goal status: INITIAL  4.  Pt will demonstrate improved R LE foot clearance for safety with gait. Baseline: decreased R LE foot clearance with fatigue on Eval Goal status: INITIAL  5.  Patient will increase Functional Gait Assessment score to > or = 20/30 as to reduce fall risk and improve dynamic gait safety with community ambulation. Baseline: 11/30 on 06/29/24 Goal status: INITIAL   ASSESSMENT:  CLINICAL IMPRESSION: Pt returns following recent medical episode requiring iron  infusion.  She has recovered well as demonstrated by lack of symptoms and vital signs this visit.  She will have 2 more iron  infusions in coming weeks.  She demonstrates decreased initiation of tasks this visit requiring increased time to engage BLE to hip and knee flexion in open chain primarily.  Her RLE poses most difficulty this visit.  She stands to benefit from further skilled PT intervention in this setting to improve mobility pattern, fluidity of movement and symptom management (heaviness and burning of RLE) which impact her gait tolerance and dynamic stability.  Continue per POC.   OBJECTIVE IMPAIRMENTS: Abnormal gait, cardiopulmonary status limiting activity, decreased activity tolerance, decreased balance, decreased coordination, decreased endurance, decreased knowledge of condition, decreased knowledge of use of DME, decreased mobility, difficulty  walking, decreased ROM, decreased strength, increased muscle spasms, impaired flexibility, impaired sensation, impaired tone, improper body mechanics, postural dysfunction, and pain.   ACTIVITY LIMITATIONS: carrying, lifting, bending, standing, squatting, stairs, transfers, bathing, and locomotion level  PARTICIPATION LIMITATIONS: cleaning, laundry, shopping, community activity, and occupation  PERSONAL FACTORS: Past/current experiences, Time since onset of injury/illness/exacerbation, and 1-2 comorbidities: MS and CHF are also affecting patient's functional outcome.   REHAB POTENTIAL: Good  CLINICAL DECISION  MAKING: Evolving/moderate complexity  EVALUATION COMPLEXITY: Moderate  PLAN:  PT FREQUENCY: 2x/week  PT DURATION: 8 weeks  PLANNED INTERVENTIONS: 97164- PT Re-evaluation, 97750- Physical Performance Testing, 97110-Therapeutic exercises, 97530- Therapeutic activity, [id number]- Neuromuscular re-education, 97535- Self Care, [id number]- Manual therapy, [id number]- Gait training, [phone]- Orthotic Initial, [phone]- Orthotic/Prosthetic subsequent, [phone]- Aquatic Therapy, [phone]- Electrical stimulation (manual), [id number]- Ultrasound, [phone] (1-2 muscles), [id number] (3+ muscles)- Dry Needling, Patient/Family education, Balance training, Stair training, Taping, Joint mobilization, Spinal mobilization, Compression bandaging, DME instructions, Cryotherapy, Moist heat, and Biofeedback  PLAN FOR NEXT SESSION: Review and update HEP.  Aerobic tolerance, strengthening, balance, coordination, stretching, improving R LE foot clearance with gait.   Daved KATHEE Bull, PT, DPT 07/16/2024, 2:52 PM

## 2024-07-20 ENCOUNTER — Ambulatory Visit

## 2024-07-20 NOTE — Progress Notes (Deleted)
 Patient ID: Amanda [name]                 DOB: January 31, 1981                    MRN: [id number]     HPI: Amanda [name] is a 43 y.o. female patient referred to pharmacy clinic by Dr. Rolan to initiate GLP1-RA therapy. PMH is significant for chronic systolic CHF, MS, HTN, and obesity. Most recent BMI *** kg/m .   Current weight and BMI: *** kg/m  Goal weight:  Current meds that affect weight: gabapentin   Diet:  Breakfast: Lunch/Dinner: Snacks: Beverages:  Exercise:   Family History:  Relation Problem Comments  Mother Diabetes   Heart disease   Hypertension   Obesity   Stroke     Father Diabetes     Sister Fibroids     Cousin Metallurgist) Multiple sclerosis     Cousin (Deceased) Multiple sclerosis       Social History: Alcohol: Smoking:   Labs: Lab Results  Component Value Date   HGBA1C 5.4 03/28/2023    Wt Readings from Last 1 Encounters:  06/18/24 (!) 327 lb 12.8 oz (148.7 kg)    BP Readings from Last 1 Encounters:  07/16/24 (!) 143/84   Pulse Readings from Last 1 Encounters:  07/16/24 88       Component Value Date/Time   CHOL 134 06/18/2024 1008   CHOL 147 02/08/2020 1448   TRIG 45 06/18/2024 1008   HDL 45 06/18/2024 1008   HDL 40 02/08/2020 1448   CHOLHDL 3.0 06/18/2024 1008   VLDL 9 06/18/2024 1008   LDLCALC 80 06/18/2024 1008   LDLCALC 94 02/08/2020 1448    Past Medical History:  Diagnosis Date   Anemia    BP (high blood pressure)    Chest pain    CHF (congestive heart failure) (HCC)    Edema, lower extremity    History of blood transfusion 08/2017   related to low blood count    Infertility, female    Joint pain    Multiple sclerosis    Pneumonia 12/2017   Shortness of breath    Swallowing difficulty    Vitamin D  deficiency     Current Outpatient Medications on File Prior to Visit  Medication Sig Dispense Refill   acetaminophen  (TYLENOL ) 500 MG tablet Take 1,000 mg by mouth 2 (two) times daily as needed for mild pain,  moderate pain, fever or headache.     baclofen  (LIORESAL ) 10 MG tablet 1/2 to 1 po tid 90 each 11   carvedilol  (COREG ) 12.5 MG tablet Take 1.5 tablets (18.75 mg total) by mouth 2 (two) times daily with a meal. 180 tablet 3   Cholecalciferol (VITAMIN D -3 PO) Take 1 capsule by mouth in the morning.     Cladribine, 10 Tabs, (MAVENCLAD, 10 TABS,) 10 MG TBPK Take 20 mg by mouth daily.     ferrous sulfate  325 (65 FE) MG tablet Take 1 tablet (325 mg total) by mouth 2 (two) times daily with a meal. 60 tablet 3   gabapentin  (NEURONTIN ) 300 MG capsule Take 1 capsule (300 mg total) by mouth 3 (three) times daily. 90 capsule 11   levETIRAcetam  (KEPPRA ) 750 MG tablet Take 1 tablet (750 mg total) by mouth 2 (two) times daily. (Patient not taking: Reported on 06/18/2024) 60 tablet 11   sacubitril -valsartan  (ENTRESTO ) 97-103 MG Take 1 tablet by mouth 2 (two) times daily. 180 tablet 3  spironolactone  (ALDACTONE ) 25 MG tablet Take 1 tablet (25 mg total) by mouth at bedtime. 90 tablet 3   No current facility-administered medications on file prior to visit.    No Known Allergies   Assessment/Plan:  1. Weight loss - Patient has not met goal of at least 5% of body weight loss with comprehensive lifestyle modifications alone in the past 3-6 months. Pharmacotherapy is appropriate to pursue as augmentation. Will start*** . Confirmed patient not ***pregnant and no personal or family history of medullary thyroid  carcinoma (MTC) or Multiple Endocrine Neoplasia syndrome type 2 (MEN 2). Injection technique reviewed at today's visit.  Advised patient on common side effects including nausea, diarrhea, dyspepsia, decreased appetite, and fatigue. Counseled patient on reducing meal size and how to titrate medication to minimize side effects. Counseled patient to call if intolerable side effects or if experiencing dehydration, abdominal pain, or dizziness. Along with pharmacotherapy, the patient will follow dietary modifications  and aim for at least 150 minutes of moderate-intensity exercise per week, plus resistance training twice a week (as recommended by the American Heart Association). This resistance training--such as weightlifting, bodyweight exercises, or using resistance bands, adapted to the patient's ability--will help prevent muscle loss.  Follow up in 1-2 days regarding coverage of *** . If therapy is initiated, phone follow-ups will be conducted every 4 weeks for dose titration until the patient reaches the effective therapeutic dose and target weight.  Amanda [name], Pharm.D, CPP Amanda [name]. [facility] [address], [place], [place] [address] Phone: [phone]; Fax: [phone]

## 2024-07-21 ENCOUNTER — Encounter (HOSPITAL_COMMUNITY)
Admission: RE | Admit: 2024-07-21 | Discharge: 2024-07-21 | Disposition: A | Discharge status: Home / Self Care | Source: Ambulatory Visit | Attending: Cardiology | Admitting: Cardiology

## 2024-07-21 ENCOUNTER — Encounter: Payer: Self-pay | Admitting: Physical Therapy

## 2024-07-21 ENCOUNTER — Ambulatory Visit: Admitting: Physical Therapy

## 2024-07-21 VITALS — BP 134/80 | HR 80 | Temp 97.5°F | Resp 16

## 2024-07-21 VITALS — BP 120/78 | HR 84

## 2024-07-21 DIAGNOSIS — R2689 Other abnormalities of gait and mobility: Secondary | ICD-10-CM

## 2024-07-21 DIAGNOSIS — R29898 Other symptoms and signs involving the musculoskeletal system: Secondary | ICD-10-CM

## 2024-07-21 DIAGNOSIS — I5022 Chronic systolic (congestive) heart failure: Secondary | ICD-10-CM

## 2024-07-21 DIAGNOSIS — M6281 Muscle weakness (generalized): Secondary | ICD-10-CM

## 2024-07-21 DIAGNOSIS — R29818 Other symptoms and signs involving the nervous system: Secondary | ICD-10-CM

## 2024-07-21 DIAGNOSIS — D508 Other iron deficiency anemias: Secondary | ICD-10-CM

## 2024-07-21 MED ORDER — IRON SUCROSE 300 MG IVPB - SIMPLE MED
300.0000 mg | Freq: Once | Status: AC
  Administered 2024-07-21: 300 mg via INTRAVENOUS
  Filled 2024-07-21: qty 300

## 2024-07-21 NOTE — Therapy (Signed)
 OUTPATIENT PHYSICAL THERAPY NEURO TREATMENT   Patient Name: Amanda [name] MRN: [id number] DOB:1980-08-13, 43 y.o., female Today's Date: 07/21/2024   PCP: Alben Therisa MATSU, [place] REFERRING PROVIDER: [name], MD  END OF SESSION:  PT End of Session - 07/21/24 1225     Visit Number 5    Number of Visits 17   16 visits plus Eval   Date for Recertification  09/03/24   for scheduling delays   Authorization Type [employer]    Authorization - Number of Visits 30   30 (PT,OT)   PT Start Time 1224    PT Stop Time 1312    PT Time Calculation (min) 48 min    Equipment Utilized During Treatment Gait belt    Activity Tolerance No increased pain;Patient tolerated treatment well    Behavior During Therapy [facility] for tasks assessed/performed          Past Medical History:  Diagnosis Date   Anemia    BP (high blood pressure)    Chest pain    CHF (congestive heart failure) (HCC)    Edema, lower extremity    History of blood transfusion 08/2017   related to low blood count    Infertility, female    Joint pain    Multiple sclerosis    Pneumonia 12/2017   Shortness of breath    Swallowing difficulty    Vitamin D  deficiency    Past Surgical History:  Procedure Laterality Date   CARDIAC CATHETERIZATION  01/12/2018   Patient Active Problem List   Diagnosis Date Noted   Other iron  deficiency anemias 07/06/2024   Anemia 07/30/2022   Influenza A 07/30/2022   Essential hypertension 02/08/2020   Vitamin D  deficiency 02/08/2020   Depression 02/08/2020   History of anemia-  uterine fibroids 02/08/2020   History of uterine fibroid 02/08/2020   Chronic systolic CHF (congestive heart failure) (HCC) 02/08/2020   At risk for diabetes mellitus 02/08/2020   Acute systolic (congestive) heart failure (HCC) 01/09/2018   Idiopathic cardiomyopathy (HCC) 01/09/2018   Uterine fibroid 09/07/2017   Multiple sclerosis    Class 3 severe obesity with serious comorbidity and body mass index (BMI) of  50.0 to 59.9 in adult [facility])     ONSET DATE: 06/14/2024  REFERRING DIAG: H64.A (ICD-10-CM) - Relapsing remitting multiple sclerosis R26.9 (ICD-10-CM) - Gait disturbance M62.838 (ICD-10-CM) - Muscle spasm of right leg  THERAPY DIAG:  Other abnormalities of gait and mobility  Muscle weakness (generalized)  Other symptoms and signs involving the nervous system  Other symptoms and signs involving the musculoskeletal system  Rationale for Evaluation and Treatment: Rehabilitation  SUBJECTIVE:  SUBJECTIVE STATEMENT: Pt reports having iron  infusion this morning (pt reports staff reported no precautions for planned therapy session today after infusion) and last one is 17th of Dec (next week).  No recent falls.  Still tired in general.  Pt arrived using SPC.  Still doing HEP just not every day d/t fatigue.  Tries to walk when she has the energy.  R LE heaviness/burning symptoms improved (pt reports anticipate d/t not raining today). Pt accompanied by: self  PERTINENT HISTORY: Pt with relapsing remitting MS.  Worsening balance.  R leg weakness (R foot drop) and painful spasms R foot.  R flank down to R leg/foot numbness since Dec 2024 relapse.  L toes numb.  Blurry vision sometimes.  Wording finding difficulty new in past 6 months.  Taking Baclofen  TID (often sleepy).  PMH includes MS, chronic CHF, anemia, high BP, chest pain, LE edema, joint pain, PNA, SOB, swallowing difficulty, Vitamin D  deficiency, cardiac cath.  PAIN:  Are you having pain? 4/10 R LE pain (aching d/t weather)  PRECAUTIONS: Fall  RED FLAGS: None   WEIGHT BEARING RESTRICTIONS: No  FALLS: Has patient fallen in last 6 months? No  LIVING ENVIRONMENT: Lives with: lives with their family (husband) Lives in: House/apartment 1 level  house Stairs: level entry Has following equipment at home: Single point cane  PLOF: Modified independent ambulating with SPC in community; uses buggies at stores; no AD use in home  PATIENT GOALS: Get my balance back and get stability in feet and legs.  OBJECTIVE:  Note: Objective measures were completed at Evaluation unless otherwise noted.  DIAGNOSTIC FINDINGS:  Per MRI Brain 02/23/24: Multiple T2/FLAIR hyperintense foci in the cerebral hemispheres, left thalamus, brainstem and cerebellum in a pattern consistent with demyelinating plaque associated with multiple sclerosis. Most of the foci appear to be chronic though 1 small focus in the left frontal lobe enhances after contrast consistent with acute demyelination. Compared to the MRI from 2016, there has been significant progression.  COGNITION: Overall cognitive status: Within functional limits for tasks assessed   SENSATION: Tingling/numbness R lower leg and foot; decreased light touch R lateral lower leg and foot  COORDINATION: Decreased quality R LE alternating rapid toe taps compared to L LE in sitting Unable to perform R LE heel to shin coordination d/t weakness in sitting (L LE appearing WFL)  EDEMA:  None noted  MUSCLE TONE: RLE: Mild and Hypertonic; no clonus noted R ankle  POSTURE: rounded shoulders and forward head  LOWER EXTREMITY ROM:     Active  Right Eval Left Eval  Hip flexion WFL WNL  Hip extension    Hip abduction    Hip adduction    Hip internal rotation    Hip external rotation    Knee flexion WNL WNL  Knee extension WNL WNL  Ankle dorsiflexion WNL WNL  Ankle plantarflexion WNL WNL  Ankle inversion    Ankle eversion     (Blank rows = not tested)  LOWER EXTREMITY MMT:    MMT Right Eval Left Eval  Hip flexion 3+/5 4+/5  Hip extension    Hip abduction    Hip adduction    Hip internal rotation    Hip external rotation    Knee flexion 4/5 5/5  Knee extension 4-/5 5/5  Ankle  dorsiflexion 4/5 4+/5  Ankle plantarflexion At least 3/5 AROM At least 3/5 AROM  Ankle inversion    Ankle eversion    (Blank rows = not tested)  BED MOBILITY:  Pt reports no issues  TRANSFERS: Sit to stand: Modified independence  Assistive device utilized: None     Stand to sit: Modified independence  Assistive device utilized: None      GAIT: Findings: Gait Characteristics: decreased R LE heel-strike and foot clearance, Distance walked: clinic distances, Assistive device utilized:Single point cane, Level of assistance: SBA, and Comments: decreased R LE foot clearance and decreased gait speed with fatigue; R LE spasm post ambulation on  FUNCTIONAL TESTS:  5 times sit to stand: 24.22 seconds no UE support (increased effort and time to stand last 2 trials) 10 meter walk test: 0.60 m/sec with SPC R UE (16.53 seconds) FGA: 11/30 on 06/29/24  FUNCTIONAL GAIT ASSESSMENT  Date: 06/29/24 (with use of SPC R UE) Score  GAIT LEVEL SURFACE Instructions: Walk at your normal speed from here to the next mark (6 m) [20 ft]. (1) Moderate impairment-Walks 6 m (20 ft), slow speed, abnormal gait pattern, evidence for imbalance, or deviates 25.4 - 38.1 cm (10 -15 in) outside of the 30.48-cm (12-in) walkway width. Requires more than 7 seconds to ambulate 6 m (20 ft).11.47 seconds with SPC  2.   CHANGE IN GAIT SPEED Instructions: Begin walking at your normal pace (for 1.5 m [5 ft]). When I tell you go, walk as fast as you can (for 1.5 m [5 ft]). When I tell you slow, walk as slowly as you can (for 1.5 m [5 ft]. (1) Moderate impairment - Makes only minor adjustments to walking speed, or accomplishes a change in speed with significant gait deviations, deviates 25.4 -38.1 cm (10 -15 in) outside the 30.48-cm (12-in) walkway width, or changes speed but loses balance but is able to recover and continue walking.  3.    GAIT WITH HORIZONTAL HEAD TURNS Instructions: Walk from here to the next mark 6 m (20 ft)  away. Begin walking at your normal pace. Keep walking straight; after 3 steps, turn your head to the right and keep walking straight while looking to the right. After 3 more steps, turn your head to the left and keep walking straight while looking left. Continue alternating looking right and left. (1) Moderate impairment - Performs head turns with moderate change in gait velocity, slows down, deviates 25.4 -38.1 cm (10 -15 in) outside 30.48-cm (12-in) walkway width but recovers, can continue to walk.  4.   GAIT WITH VERTICAL HEAD TURNS Instructions: Walk from here to the next mark (6 m [20 ft]). Begin walking at your normal pace. Keep walking straight; after 3 steps, tip your head up and keep walking straight while looking up. After 3 more steps, tip your head down, keep walking straight while looking down. Continue  alternating looking up and down every 3 steps until you have completed 2 repetitions in each direction. (2) Mild impairment - Performs task with slight change in gait velocity (eg, minor disruption to smooth gait path), deviates 15.24 -25.4 cm (6 -10 in) outside 30.48-cm (12-in) walkway width or uses assistive device.  5.  GAIT AND PIVOT TURN Instructions: Begin with walking at your normal pace. When I tell you, turn and stop, turn as quickly as you can to face the opposite direction and stop. (2) Mild impairment - Pivot turns safely in 3 seconds and stops with no loss of balance, or pivot turns safely within 3 seconds and stops with mild imbalance, requires small steps to catch balance  6.   STEP OVER OBSTACLE Instructions: Begin walking at your normal speed.  When you come to the shoe box, step over it, not around it, and keep walking. (1) Moderate impairment - Is able to step over one shoe box (11.43 cm [4.5 in] total height) but must slow down and adjust steps to clear box safely. May require verbal cueing.  7.   GAIT WITH NARROW BASE OF SUPPORT Instructions: Walk on the floor with arms  folded across the chest, feet aligned heel to toe in tandem for a distance of 3.6 m [12 ft]. The number of steps taken in a straight line are counted for a maximum of 10 steps. (0) Severe impairment - Ambulates less than 4 steps heel to toe or cannot perform without assistance.  8.   GAIT WITH EYES CLOSED Instructions: Walk at your normal speed from here to the next mark (6 m [20 ft]) with your eyes closed. (1) Moderate impairment - Walks 6 m (20 ft), slow speed, abnormal gait pattern, evidence for imbalance, deviates 25.4 -38.1 cm (10 -15 in) outside 30.48-cm (12-in) walkway width. Requires more than 9 seconds to ambulate 6 m (20 ft). 19.56 seconds with SPC  9.   AMBULATING BACKWARDS Instructions: Walk backwards until I tell you to stop (1) Moderate impairment - Walks 6 m (20 ft), slow speed, abnormal gait pattern, evidence for imbalance, deviates 25.4 -38.1 cm (10 -15 in) outside 30.48-cm (12-in) walkway width.26.63 seconds  10. STEPS Instructions: Walk up these stairs as you would at home (ie, using the rail if necessary). At the top turn around and walk down. (1) Moderate impairment-Two feet to a stair; must use rail.  Alternating pattern ascending with R UE support on railing: step to pattern with B UE support on railing  Total 11/30   Interpretation of scores: Non-Specific Older Adults Cutoff Score: <=22/30 = risk of falls Parkinsons Disease Cutoff score <15/30= fall risk (Hoehn & Yahr 1-4)  Minimally Clinically Important Difference (MCID)  Stroke (acute, subacute, and chronic) = MDC: 4.2 points Vestibular (acute) = MDC: 6 points Community Dwelling Older Adults =  MCID: 4 points Parkinsons Disease  =  MDC: 4.3 points  (Academy of Neurologic Physical Therapy (nd). Functional Gait Assessment. Retrieved from https://www.neuropt.org/docs/default-source/cpgs/core-outcome-measures/function-gait-assessment-pocket-guide-proof9-(2).pdf?sfvrsn=b58f35043_0.)   PATIENT SURVEYS:  TBA                                                                                                                               TREATMENT DATE: 07/21/24  Self Care: BP and HR taken in sitting at rest beginning of session (see below for details).  Manual BP taken L UE. Vitals:   07/21/24 1231  BP: 120/78  Pulse: 84    Therapeutic Exercise SciFit multi-peaks up to level 3 for 8 minutes using BUE/BLEs for global strengthening, dynamic cardiovascular endurance and increased amplitude of stepping. RPE of 5/10 with HR 113 bpm following activity.  Average stride length 11.4 inches.   Therapeutic Activity: Standing on Airex: Throwing small air-filled ball at re-bounder (throw  then catch ball =1 rep): x20 reps x2 sets (pt throwing at different angles so catching at different angles and outside BOS for challenge; CGA for safety; standing rest break between sets) Semi-tandem stance x4 trials x1 minute each trial (x2 trials with R foot forward; x2 trials with L foot forward) Notes: increased difficulty with semi-tandem stance with L foot forward noted; CGA for safety  PATIENT EDUCATION: Education details: Continue HEP.   Person educated: Patient Education method: Explanation and Verbal cues Education comprehension: verbalized understanding, verbal cues required, and needs further education  HOME EXERCISE PROGRAM: Access Code: [email or web address] URL: [email or web address] Date: 06/25/2024 Prepared by: Damien Caulk  Exercises - Seated March  - 1 x daily - 5 x weekly - 1-2 sets - 5-10 reps - Seated Long Arc Quad  - 1 x daily - 5 x weekly - 1-2 sets - 5-10 reps - Seated Heel Raise  - 1 x daily - 5 x weekly - 1-2 sets - 5-10 reps  GOALS: Goals reviewed with patient? Yes  SHORT TERM GOALS: Target date: 07/23/2024  Pt will be independent with initial HEP in order to improve strength and balance in order to decrease fall risk and improve function at home for ADL's. Baseline: Issued 06/25/24 Goal  status: INITIAL  2.  Pt will decrease 5 Time Sit to Stand by at least 3 seconds in order to demonstrate clinically significant improvement in LE strength. Baseline: 24.22 seconds on Eval Goal status: INITIAL  3.  Assess FGA and update LTG as needed. Baseline: 11/30 on 06/29/24 Goal status: INITIAL   LONG TERM GOALS: Target date: 08/20/2024  Pt will be independent with final HEP in order to improve strength and balance in order to decrease fall risk and improve function at home for ADL's. Baseline: TBA Goal status: INITIAL  2.  Pt will decrease 5 Time Sit to Stand by at least 3 seconds (from STG measurement) in order to demonstrate clinically significant improvement in LE strength. Baseline: 24.22 seconds on Eval Goal status: INITIAL  3.  Pt will increase by at least 0.13 m/s in order to demonstrate clinically significant improvement in community ambulation.  Baseline:  0.60 m/sec with SPC on Eval Goal status: INITIAL  4.  Pt will demonstrate improved R LE foot clearance for safety with gait. Baseline: decreased R LE foot clearance with fatigue on Eval Goal status: INITIAL  5.  Patient will increase Functional Gait Assessment score to > or = 20/30 as to reduce fall risk and improve dynamic gait safety with community ambulation. Baseline: 11/30 on 06/29/24 Goal status: INITIAL   ASSESSMENT:  CLINICAL IMPRESSION: Patient was seen today for physical therapy treatment to address endurance and balance.  Focused session on cardiovascular endurance and static and dynamic balance on compliant surface.  Pt given pacing and rest breaks during session.  Patient continues to be limited by R LE weakness and coordination.  They demonstrate improvement in balance reaction responses during session.  They would continue to benefit from skilled PT to address impairments as noted and progress towards long term goals.  OBJECTIVE IMPAIRMENTS: Abnormal gait, cardiopulmonary status limiting  activity, decreased activity tolerance, decreased balance, decreased coordination, decreased endurance, decreased knowledge of condition, decreased knowledge of use of DME, decreased mobility, difficulty walking, decreased ROM, decreased strength, increased muscle spasms, impaired flexibility, impaired sensation, impaired tone, improper body mechanics, postural dysfunction, and pain.   ACTIVITY LIMITATIONS: carrying, lifting, bending, standing, squatting, stairs, transfers, bathing, and locomotion level  PARTICIPATION LIMITATIONS: cleaning,  laundry, shopping, community activity, and occupation  PERSONAL FACTORS: Past/current experiences, Time since onset of injury/illness/exacerbation, and 1-2 comorbidities: MS and CHF are also affecting patient's functional outcome.   REHAB POTENTIAL: Good  CLINICAL DECISION MAKING: Evolving/moderate complexity  EVALUATION COMPLEXITY: Moderate  PLAN:  PT FREQUENCY: 2x/week  PT DURATION: 8 weeks  PLANNED INTERVENTIONS: [id number]- PT Re-evaluation, 97750- Physical Performance Testing, 97110-Therapeutic exercises, 97530- Therapeutic activity, [id number]- Neuromuscular re-education, 97535- Self Care, [id number]- Manual therapy, [id number]- Gait training, [phone]- Orthotic Initial, [phone]- Orthotic/Prosthetic subsequent, [phone]- Aquatic Therapy, [phone]- Electrical stimulation (manual), [id number]- Ultrasound, [id number] (1-2 muscles), [id number] (3+ muscles)- Dry Needling, Patient/Family education, Balance training, Stair training, Taping, Joint mobilization, Spinal mobilization, Compression bandaging, DME instructions, Cryotherapy, Moist heat, and Biofeedback  PLAN FOR NEXT SESSION: STG's due.  Review and update HEP.  Aerobic tolerance, strengthening, balance, coordination, stretching, improving R LE foot clearance with gait.   Damien Caulk, PT 07/21/2024, 6:45 PM

## 2024-07-23 ENCOUNTER — Ambulatory Visit: Admitting: Physical Therapy

## 2024-07-23 ENCOUNTER — Encounter: Payer: Self-pay | Admitting: Physical Therapy

## 2024-07-23 VITALS — BP 142/78 | HR 95

## 2024-07-23 DIAGNOSIS — R29818 Other symptoms and signs involving the nervous system: Secondary | ICD-10-CM

## 2024-07-23 DIAGNOSIS — R2689 Other abnormalities of gait and mobility: Secondary | ICD-10-CM | POA: Diagnosis not present

## 2024-07-23 DIAGNOSIS — M6281 Muscle weakness (generalized): Secondary | ICD-10-CM

## 2024-07-23 DIAGNOSIS — R29898 Other symptoms and signs involving the musculoskeletal system: Secondary | ICD-10-CM
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
## 2024-07-27 ENCOUNTER — Ambulatory Visit: Admitting: Physical Therapy

## 2024-07-28 ENCOUNTER — Ambulatory Visit: Admitting: Physical Therapy

## 2024-07-28 ENCOUNTER — Inpatient Hospital Stay (HOSPITAL_COMMUNITY): Admission: RE | Admit: 2024-07-28 | Discharge: 2024-07-28 | Attending: Cardiology

## 2024-07-28 ENCOUNTER — Encounter: Payer: Self-pay | Admitting: Physical Therapy

## 2024-07-28 VITALS — BP 140/83 | HR 91

## 2024-07-28 VITALS — BP 144/91 | HR 86 | Temp 98.3°F | Resp 16

## 2024-07-28 DIAGNOSIS — R29898 Other symptoms and signs involving the musculoskeletal system: Secondary | ICD-10-CM

## 2024-07-28 DIAGNOSIS — D508 Other iron deficiency anemias: Secondary | ICD-10-CM

## 2024-07-28 DIAGNOSIS — R29818 Other symptoms and signs involving the nervous system: Secondary | ICD-10-CM

## 2024-07-28 DIAGNOSIS — I5022 Chronic systolic (congestive) heart failure: Secondary | ICD-10-CM | POA: Diagnosis not present

## 2024-07-28 DIAGNOSIS — M6281 Muscle weakness (generalized): Secondary | ICD-10-CM

## 2024-07-28 DIAGNOSIS — R2689 Other abnormalities of gait and mobility: Secondary | ICD-10-CM | POA: Diagnosis not present

## 2024-07-28 MED ORDER — IRON SUCROSE 300 MG IVPB - SIMPLE MED
300.0000 mg | Freq: Once | Status: AC
  Administered 2024-07-28: 09:00:00 300 mg via INTRAVENOUS
  Filled 2024-07-28: qty 300

## 2024-07-28 NOTE — Therapy (Signed)
 OUTPATIENT PHYSICAL THERAPY NEURO TREATMENT   Patient Name: Amanda [name] MRN: [id number] DOB:02-Aug-1981, 43 y.o., female Today's Date: 07/29/2024   PCP: Alben Therisa MATSU, PA REFERRING PROVIDER: [name], MD  END OF SESSION:  PT End of Session - 07/28/24 1231     Visit Number 7    Number of Visits 17   16 visits plus Eval   Date for Recertification  09/03/24   for scheduling delays   Authorization Type [employer]    Authorization - Visit Number 7    Authorization - Number of Visits 30   30 (PT,OT)   PT Start Time 1230    PT Stop Time 1315    PT Time Calculation (min) 45 min    Equipment Utilized During Treatment Gait belt    Activity Tolerance No increased pain;Patient tolerated treatment well    Behavior During Therapy [facility] for tasks assessed/performed          Past Medical History:  Diagnosis Date   Anemia    BP (high blood pressure)    Chest pain    CHF (congestive heart failure) (HCC)    Edema, lower extremity    History of blood transfusion 08/2017   related to low blood count    Infertility, female    Joint pain    Multiple sclerosis    Pneumonia 12/2017   Shortness of breath    Swallowing difficulty    Vitamin D  deficiency    Past Surgical History:  Procedure Laterality Date   CARDIAC CATHETERIZATION  01/12/2018   Patient Active Problem List   Diagnosis Date Noted   Other iron  deficiency anemias 07/06/2024   Anemia 07/30/2022   Influenza A 07/30/2022   Essential hypertension 02/08/2020   Vitamin D  deficiency 02/08/2020   Depression 02/08/2020   History of anemia-  uterine fibroids 02/08/2020   History of uterine fibroid 02/08/2020   Chronic systolic CHF (congestive heart failure) (HCC) 02/08/2020   At risk for diabetes mellitus 02/08/2020   Acute systolic (congestive) heart failure (HCC) 01/09/2018   Idiopathic cardiomyopathy (HCC) 01/09/2018   Uterine fibroid 09/07/2017   Multiple sclerosis    Class 3 severe obesity with serious  comorbidity and body mass index (BMI) of 50.0 to 59.9 in adult [facility])     ONSET DATE: 06/14/2024  REFERRING DIAG: H64.A (ICD-10-CM) - Relapsing remitting multiple sclerosis R26.9 (ICD-10-CM) - Gait disturbance M62.838 (ICD-10-CM) - Muscle spasm of right leg  THERAPY DIAG:  Other abnormalities of gait and mobility  Muscle weakness (generalized)  Other symptoms and signs involving the nervous system  Other symptoms and signs involving the musculoskeletal system  Rationale for Evaluation and Treatment: Rehabilitation  SUBJECTIVE:  SUBJECTIVE STATEMENT: Had iron  infusion this morning (last one); reports no restrictions for therapy.  No recent falls.  Improved R LE pain reported today.  Pt reports doing HEP; no questions; appropriately challenging. Pt accompanied by: self  PERTINENT HISTORY: Pt with relapsing remitting MS.  Worsening balance.  R leg weakness (R foot drop) and painful spasms R foot.  R flank down to R leg/foot numbness since Dec 2024 relapse.  L toes numb.  Blurry vision sometimes.  Wording finding difficulty new in past 6 months.  Taking Baclofen  TID (often sleepy).  PMH includes MS, chronic CHF, anemia, high BP, chest pain, LE edema, joint pain, PNA, SOB, swallowing difficulty, Vitamin D  deficiency, cardiac cath.  PAIN:  Are you having pain? 5-6/10 R LE pain (mild burning)  PRECAUTIONS: Fall  RED FLAGS: None   WEIGHT BEARING RESTRICTIONS: No  FALLS: Has patient fallen in last 6 months? No  LIVING ENVIRONMENT: Lives with: lives with their family (husband) Lives in: House/apartment 1 level house Stairs: level entry Has following equipment at home: Single point cane  PLOF: Modified independent ambulating with SPC in community; uses buggies at stores; no AD use in home  PATIENT  GOALS: Get my balance back and get stability in feet and legs.  OBJECTIVE:  Note: Objective measures were completed at Evaluation unless otherwise noted.  DIAGNOSTIC FINDINGS:  Per MRI Brain 02/23/24: Multiple T2/FLAIR hyperintense foci in the cerebral hemispheres, left thalamus, brainstem and cerebellum in a pattern consistent with demyelinating plaque associated with multiple sclerosis. Most of the foci appear to be chronic though 1 small focus in the left frontal lobe enhances after contrast consistent with acute demyelination. Compared to the MRI from 2016, there has been significant progression.  COGNITION: Overall cognitive status: Within functional limits for tasks assessed   SENSATION: Tingling/numbness R lower leg and foot; decreased light touch R lateral lower leg and foot  COORDINATION: Decreased quality R LE alternating rapid toe taps compared to L LE in sitting Unable to perform R LE heel to shin coordination d/t weakness in sitting (L LE appearing WFL)  EDEMA:  None noted  MUSCLE TONE: RLE: Mild and Hypertonic; no clonus noted R ankle  POSTURE: rounded shoulders and forward head  LOWER EXTREMITY ROM:     Active  Right Eval Left Eval  Hip flexion WFL WNL  Hip extension    Hip abduction    Hip adduction    Hip internal rotation    Hip external rotation    Knee flexion WNL WNL  Knee extension WNL WNL  Ankle dorsiflexion WNL WNL  Ankle plantarflexion WNL WNL  Ankle inversion    Ankle eversion     (Blank rows = not tested)  LOWER EXTREMITY MMT:    MMT Right Eval Left Eval  Hip flexion 3+/5 4+/5  Hip extension    Hip abduction    Hip adduction    Hip internal rotation    Hip external rotation    Knee flexion 4/5 5/5  Knee extension 4-/5 5/5  Ankle dorsiflexion 4/5 4+/5  Ankle plantarflexion At least 3/5 AROM At least 3/5 AROM  Ankle inversion    Ankle eversion    (Blank rows = not tested)  BED MOBILITY:  Pt reports no  issues  TRANSFERS: Sit to stand: Modified independence  Assistive device utilized: None     Stand to sit: Modified independence  Assistive device utilized: None      GAIT: Findings: Gait Characteristics: decreased R LE heel-strike and  foot clearance, Distance walked: clinic distances, Assistive device utilized:Single point cane, Level of assistance: SBA, and Comments: decreased R LE foot clearance and decreased gait speed with fatigue; R LE spasm post ambulation on  FUNCTIONAL TESTS:  5 times sit to stand: 24.22 seconds no UE support (increased effort and time to stand last 2 trials); 17.07 seconds (no UE support) 07/23/24 10 meter walk test: 0.60 m/sec with SPC R UE (16.53 seconds) FGA: 11/30 on 06/29/24  FUNCTIONAL GAIT ASSESSMENT  Date: 06/29/24 (with use of SPC R UE) Score  GAIT LEVEL SURFACE Instructions: Walk at your normal speed from here to the next mark (6 m) [20 ft]. (1) Moderate impairment-Walks 6 m (20 ft), slow speed, abnormal gait pattern, evidence for imbalance, or deviates 25.4 - 38.1 cm (10 -15 in) outside of the 30.48-cm (12-in) walkway width. Requires more than 7 seconds to ambulate 6 m (20 ft).11.47 seconds with SPC  2.   CHANGE IN GAIT SPEED Instructions: Begin walking at your normal pace (for 1.5 m [5 ft]). When I tell you go, walk as fast as you can (for 1.5 m [5 ft]). When I tell you slow, walk as slowly as you can (for 1.5 m [5 ft]. (1) Moderate impairment - Makes only minor adjustments to walking speed, or accomplishes a change in speed with significant gait deviations, deviates 25.4 -38.1 cm (10 -15 in) outside the 30.48-cm (12-in) walkway width, or changes speed but loses balance but is able to recover and continue walking.  3.    GAIT WITH HORIZONTAL HEAD TURNS Instructions: Walk from here to the next mark 6 m (20 ft) away. Begin walking at your normal pace. Keep walking straight; after 3 steps, turn your head to the right and keep walking straight while  looking to the right. After 3 more steps, turn your head to the left and keep walking straight while looking left. Continue alternating looking right and left. (1) Moderate impairment - Performs head turns with moderate change in gait velocity, slows down, deviates 25.4 -38.1 cm (10 -15 in) outside 30.48-cm (12-in) walkway width but recovers, can continue to walk.  4.   GAIT WITH VERTICAL HEAD TURNS Instructions: Walk from here to the next mark (6 m [20 ft]). Begin walking at your normal pace. Keep walking straight; after 3 steps, tip your head up and keep walking straight while looking up. After 3 more steps, tip your head down, keep walking straight while looking down. Continue  alternating looking up and down every 3 steps until you have completed 2 repetitions in each direction. (2) Mild impairment - Performs task with slight change in gait velocity (eg, minor disruption to smooth gait path), deviates 15.24 -25.4 cm (6 -10 in) outside 30.48-cm (12-in) walkway width or uses assistive device.  5.  GAIT AND PIVOT TURN Instructions: Begin with walking at your normal pace. When I tell you, turn and stop, turn as quickly as you can to face the opposite direction and stop. (2) Mild impairment - Pivot turns safely in 3 seconds and stops with no loss of balance, or pivot turns safely within 3 seconds and stops with mild imbalance, requires small steps to catch balance  6.   STEP OVER OBSTACLE Instructions: Begin walking at your normal speed. When you come to the shoe box, step over it, not around it, and keep walking. (1) Moderate impairment - Is able to step over one shoe box (11.43 cm [4.5 in] total height) but must slow down  and adjust steps to clear box safely. May require verbal cueing.  7.   GAIT WITH NARROW BASE OF SUPPORT Instructions: Walk on the floor with arms folded across the chest, feet aligned heel to toe in tandem for a distance of 3.6 m [12 ft]. The number of steps taken in a straight line are  counted for a maximum of 10 steps. (0) Severe impairment - Ambulates less than 4 steps heel to toe or cannot perform without assistance.  8.   GAIT WITH EYES CLOSED Instructions: Walk at your normal speed from here to the next mark (6 m [20 ft]) with your eyes closed. (1) Moderate impairment - Walks 6 m (20 ft), slow speed, abnormal gait pattern, evidence for imbalance, deviates 25.4 -38.1 cm (10 -15 in) outside 30.48-cm (12-in) walkway width. Requires more than 9 seconds to ambulate 6 m (20 ft). 19.56 seconds with SPC  9.   AMBULATING BACKWARDS Instructions: Walk backwards until I tell you to stop (1) Moderate impairment - Walks 6 m (20 ft), slow speed, abnormal gait pattern, evidence for imbalance, deviates 25.4 -38.1 cm (10 -15 in) outside 30.48-cm (12-in) walkway width.26.63 seconds  10. STEPS Instructions: Walk up these stairs as you would at home (ie, using the rail if necessary). At the top turn around and walk down. (1) Moderate impairment-Two feet to a stair; must use rail.  Alternating pattern ascending with R UE support on railing: step to pattern with B UE support on railing  Total 11/30   Interpretation of scores: Non-Specific Older Adults Cutoff Score: <=22/30 = risk of falls Parkinsons Disease Cutoff score <15/30= fall risk (Hoehn & Yahr 1-4)  Minimally Clinically Important Difference (MCID)  Stroke (acute, subacute, and chronic) = MDC: 4.2 points Vestibular (acute) = MDC: 6 points Community Dwelling Older Adults =  MCID: 4 points Parkinsons Disease  =  MDC: 4.3 points  (Academy of Neurologic Physical Therapy (nd). Functional Gait Assessment. Retrieved from https://www.neuropt.org/docs/default-source/cpgs/core-outcome-measures/function-gait-assessment-pocket-guide-proof9-(2).pdf?sfvrsn=b45f35043_0.)                                                                                                                          TREATMENT DATE: 07/28/24  Self Care: BP and HR taken in  sitting at rest beginning of session (see below for details). Vitals:   07/28/24 1233  BP: (!) 140/83  Pulse: 91  End of session HR 92 bpm  Therapeutic Exercise: SciFit multi-peaks up to level 3 for 8 minutes using BUE/BLEs for neural priming for reciprocal movement, dynamic cardiovascular warmup and increased amplitude of stepping. RPE of 6/10 following activity.  Average stride length 11.4 inches.  Therapeutic Activity: Balance in // bars: 6 foot foam balance beam: Side stepping L/R (L then R =1 rep): x3 reps Notes: increased time to take steps; progressing from mostly B UE support to mostly single UE support to mostly no UE support; CGA for safety Heel to toe tandem walking (forward): x4 reps forward Notes: increased time to take steps; intermittent  UE support for balance Toe taps at steps: x10 reps B LE's to 1st step (6 inch) x5 reps B LE's to 2nd step (12 inch) Notes: CGA for safety; increased time to perform (pt concentrating on technique)   PATIENT EDUCATION: Education details: Continue HEP.   Person educated: Patient Education method: Explanation, Demonstration, Tactile cues, Verbal cues, and Handouts Education comprehension: verbalized understanding, returned demonstration, verbal cues required, and needs further education  HOME EXERCISE PROGRAM: Access Code: [email or web address] URL: [email or web address] Date: 07/23/2024 Prepared by: Damien Caulk  Exercises - Seated March  - 1 x daily - 5 x weekly - 1-2 sets - 5-10 reps - Seated Long Arc Quad  - 1 x daily - 5 x weekly - 1-2 sets - 5-10 reps - Standing March with Counter Support  - 1 x daily - 5 x weekly - 1-2 sets - 10 reps - Standing Hip Abduction with Counter Support  - 1 x daily - 5 x weekly - 1-2 sets - 10 reps - Heel Raises with Counter Support  - 1 x daily - 5 x weekly - 1-2 sets - 10 reps - Supine Bridge  - 1 x daily - 5 x weekly - 1-2 sets - 10 reps - Clamshell  - 1 x daily - 5 x weekly - 1-2 sets -  5-10 reps  GOALS: Goals reviewed with patient? Yes  SHORT TERM GOALS: Target date: 07/23/2024  Pt will be independent with initial HEP in order to improve strength and balance in order to decrease fall risk and improve function at home for ADL's. Baseline: Issued 06/25/24 Goal status: MET  2.  Pt will decrease 5 Time Sit to Stand by at least 3 seconds in order to demonstrate clinically significant improvement in LE strength. Baseline: 24.22 seconds on Eval; 17.07 seconds 07/23/24 Goal status: MET  3.  Assess FGA and update LTG as needed. Baseline: 11/30 on 06/29/24 Goal status: MET   LONG TERM GOALS: Target date: 08/20/2024  Pt will be independent with final HEP in order to improve strength and balance in order to decrease fall risk and improve function at home for ADL's. Baseline: TBA Goal status: INITIAL  2.  Pt will decrease 5 Time Sit to Stand by at least 3 seconds (from STG measurement) in order to demonstrate clinically significant improvement in LE strength. Baseline: 24.22 seconds on Eval; 17.07 seconds 07/23/24 Goal status: INITIAL  3.  Pt will increase by at least 0.13 m/s in order to demonstrate clinically significant improvement in community ambulation.  Baseline:  0.60 m/sec with SPC on Eval Goal status: INITIAL  4.  Pt will demonstrate improved R LE foot clearance for safety with gait. Baseline: decreased R LE foot clearance with fatigue on Eval Goal status: INITIAL  5.  Patient will increase Functional Gait Assessment score to > or = 20/30 as to reduce fall risk and improve dynamic gait safety with community ambulation. Baseline: 11/30 on 06/29/24 Goal status: INITIAL   ASSESSMENT:  CLINICAL IMPRESSION: Patient was seen today for physical therapy treatment to address strength, R foot clearance, and balance.  Focused session on global strengthening, balance on compliant surface, and improving foot clearance for ambulation.  Patient continues to be  limited by strength and balance.  They demonstrate improvement in balance reaction response with repetition of balance activities.  They would continue to benefit from skilled PT to address impairments as noted and progress towards long term goals.  OBJECTIVE IMPAIRMENTS: Abnormal gait, cardiopulmonary status limiting activity,  decreased activity tolerance, decreased balance, decreased coordination, decreased endurance, decreased knowledge of condition, decreased knowledge of use of DME, decreased mobility, difficulty walking, decreased ROM, decreased strength, increased muscle spasms, impaired flexibility, impaired sensation, impaired tone, improper body mechanics, postural dysfunction, and pain.   ACTIVITY LIMITATIONS: carrying, lifting, bending, standing, squatting, stairs, transfers, bathing, and locomotion level  PARTICIPATION LIMITATIONS: cleaning, laundry, shopping, community activity, and occupation  PERSONAL FACTORS: Past/current experiences, Time since onset of injury/illness/exacerbation, and 1-2 comorbidities: MS and CHF are also affecting patient's functional outcome.   REHAB POTENTIAL: Good  CLINICAL DECISION MAKING: Evolving/moderate complexity  EVALUATION COMPLEXITY: Moderate  PLAN:  PT FREQUENCY: 2x/week  PT DURATION: 8 weeks  PLANNED INTERVENTIONS: [id number]- PT Re-evaluation, 97750- Physical Performance Testing, 97110-Therapeutic exercises, 97530- Therapeutic activity, [id number]- Neuromuscular re-education, 97535- Self Care, [id number]- Manual therapy, [id number]- Gait training, [phone]- Orthotic Initial, [phone]- Orthotic/Prosthetic subsequent, [phone]- Aquatic Therapy, [phone]- Electrical stimulation (manual), [id number]- Ultrasound, [id number] (1-2 muscles), [id number] (3+ muscles)- Dry Needling, Patient/Family education, Balance training, Stair training, Taping, Joint mobilization, Spinal mobilization, Compression bandaging, DME instructions, Cryotherapy, Moist heat, and Biofeedback  PLAN FOR NEXT SESSION:  Check on HEP.  Aerobic tolerance, strengthening, balance (especially reaching to L side), coordination, stretching, improving R LE foot clearance with gait.   Damien Caulk, PT 07/29/2024, 8:33 AM

## 2024-07-30 ENCOUNTER — Encounter: Payer: Self-pay | Admitting: Physical Therapy

## 2024-07-30 ENCOUNTER — Ambulatory Visit: Admitting: Physical Therapy

## 2024-07-30 VITALS — BP 144/97 | HR 91

## 2024-07-30 DIAGNOSIS — R2689 Other abnormalities of gait and mobility: Secondary | ICD-10-CM

## 2024-07-30 DIAGNOSIS — R29818 Other symptoms and signs involving the nervous system: Secondary | ICD-10-CM

## 2024-07-30 DIAGNOSIS — R29898 Other symptoms and signs involving the musculoskeletal system: Secondary | ICD-10-CM

## 2024-07-30 DIAGNOSIS — M6281 Muscle weakness (generalized): Secondary | ICD-10-CM

## 2024-07-30 NOTE — Therapy (Signed)
 " OUTPATIENT PHYSICAL THERAPY NEURO TREATMENT   Patient Name: Amanda [name] MRN: [id number] DOB:1980/10/07, 43 y.o., female Today's Date: 07/30/2024   PCP: Alben Therisa MATSU, PA REFERRING PROVIDER: [name], MD  END OF SESSION:  PT End of Session - 07/30/24 0929     Visit Number 8    Number of Visits 17   16 visits plus Eval   Date for Recertification  09/03/24   for scheduling delays   Authorization Type [employer]    Authorization - Visit Number 8    Authorization - Number of Visits 30   30 (PT,OT)   PT Start Time 0928    PT Stop Time 1015    PT Time Calculation (min) 47 min    Equipment Utilized During Treatment Gait belt    Activity Tolerance No increased pain;Patient tolerated treatment well    Behavior During Therapy [facility] for tasks assessed/performed          Past Medical History:  Diagnosis Date   Anemia    BP (high blood pressure)    Chest pain    CHF (congestive heart failure) (HCC)    Edema, lower extremity    History of blood transfusion 08/2017   related to low blood count    Infertility, female    Joint pain    Multiple sclerosis    Pneumonia 12/2017   Shortness of breath    Swallowing difficulty    Vitamin D  deficiency    Past Surgical History:  Procedure Laterality Date   CARDIAC CATHETERIZATION  01/12/2018   Patient Active Problem List   Diagnosis Date Noted   Other iron  deficiency anemias 07/06/2024   Anemia 07/30/2022   Influenza A 07/30/2022   Essential hypertension 02/08/2020   Vitamin D  deficiency 02/08/2020   Depression 02/08/2020   History of anemia-  uterine fibroids 02/08/2020   History of uterine fibroid 02/08/2020   Chronic systolic CHF (congestive heart failure) (HCC) 02/08/2020   At risk for diabetes mellitus 02/08/2020   Acute systolic (congestive) heart failure (HCC) 01/09/2018   Idiopathic cardiomyopathy (HCC) 01/09/2018   Uterine fibroid 09/07/2017   Multiple sclerosis    Class 3 severe obesity with serious  comorbidity and body mass index (BMI) of 50.0 to 59.9 in adult [facility])     ONSET DATE: 06/14/2024  REFERRING DIAG: H64.A (ICD-10-CM) - Relapsing remitting multiple sclerosis R26.9 (ICD-10-CM) - Gait disturbance M62.838 (ICD-10-CM) - Muscle spasm of right leg  THERAPY DIAG:  Other abnormalities of gait and mobility  Muscle weakness (generalized)  Other symptoms and signs involving the nervous system  Other symptoms and signs involving the musculoskeletal system  Rationale for Evaluation and Treatment: Rehabilitation  SUBJECTIVE:  SUBJECTIVE STATEMENT: No acute changes since last visit.  No recent falls.  HEP going good; no concerns or questions. Pt accompanied by: self  PERTINENT HISTORY: Pt with relapsing remitting MS.  Worsening balance.  R leg weakness (R foot drop) and painful spasms R foot.  R flank down to R leg/foot numbness since Dec 2024 relapse.  L toes numb.  Blurry vision sometimes.  Wording finding difficulty new in past 6 months.  Taking Baclofen  TID (often sleepy).  PMH includes MS, chronic CHF, anemia, high BP, chest pain, LE edema, joint pain, PNA, SOB, swallowing difficulty, Vitamin D  deficiency, cardiac cath.  PAIN:  Are you having pain? 5/10 R LE pain (mild burning)  PRECAUTIONS: Fall  RED FLAGS: None   WEIGHT BEARING RESTRICTIONS: No  FALLS: Has patient fallen in last 6 months? No  LIVING ENVIRONMENT: Lives with: lives with their family (husband) Lives in: House/apartment 1 level house Stairs: level entry Has following equipment at home: Single point cane  PLOF: Modified independent ambulating with SPC in community; uses buggies at stores; no AD use in home  PATIENT GOALS: Get my balance back and get stability in feet and legs.  OBJECTIVE:  Note: Objective  measures were completed at Evaluation unless otherwise noted.  DIAGNOSTIC FINDINGS:  Per MRI Brain 02/23/24: Multiple T2/FLAIR hyperintense foci in the cerebral hemispheres, left thalamus, brainstem and cerebellum in a pattern consistent with demyelinating plaque associated with multiple sclerosis. Most of the foci appear to be chronic though 1 small focus in the left frontal lobe enhances after contrast consistent with acute demyelination. Compared to the MRI from 2016, there has been significant progression.  COGNITION: Overall cognitive status: Within functional limits for tasks assessed   SENSATION: Tingling/numbness R lower leg and foot; decreased light touch R lateral lower leg and foot  COORDINATION: Decreased quality R LE alternating rapid toe taps compared to L LE in sitting Unable to perform R LE heel to shin coordination d/t weakness in sitting (L LE appearing WFL)  EDEMA:  None noted  MUSCLE TONE: RLE: Mild and Hypertonic; no clonus noted R ankle  POSTURE: rounded shoulders and forward head  LOWER EXTREMITY ROM:     Active  Right Eval Left Eval  Hip flexion WFL WNL  Hip extension    Hip abduction    Hip adduction    Hip internal rotation    Hip external rotation    Knee flexion WNL WNL  Knee extension WNL WNL  Ankle dorsiflexion WNL WNL  Ankle plantarflexion WNL WNL  Ankle inversion    Ankle eversion     (Blank rows = not tested)  LOWER EXTREMITY MMT:    MMT Right Eval Left Eval  Hip flexion 3+/5 4+/5  Hip extension    Hip abduction    Hip adduction    Hip internal rotation    Hip external rotation    Knee flexion 4/5 5/5  Knee extension 4-/5 5/5  Ankle dorsiflexion 4/5 4+/5  Ankle plantarflexion At least 3/5 AROM At least 3/5 AROM  Ankle inversion    Ankle eversion    (Blank rows = not tested)  BED MOBILITY:  Pt reports no issues  TRANSFERS: Sit to stand: Modified independence  Assistive device utilized: None     Stand to sit: Modified  independence  Assistive device utilized: None      GAIT: Findings: Gait Characteristics: decreased R LE heel-strike and foot clearance, Distance walked: clinic distances, Assistive device utilized:Single point cane, Level of assistance:  SBA, and Comments: decreased R LE foot clearance and decreased gait speed with fatigue; R LE spasm post ambulation on  FUNCTIONAL TESTS:  5 times sit to stand: 24.22 seconds no UE support (increased effort and time to stand last 2 trials); 17.07 seconds (no UE support) 07/23/24 10 meter walk test: 0.60 m/sec with SPC R UE (16.53 seconds) FGA: 11/30 on 06/29/24  FUNCTIONAL GAIT ASSESSMENT  Date: 06/29/24 (with use of SPC R UE) Score  GAIT LEVEL SURFACE Instructions: Walk at your normal speed from here to the next mark (6 m) [20 ft]. (1) Moderate impairment-Walks 6 m (20 ft), slow speed, abnormal gait pattern, evidence for imbalance, or deviates 25.4 - 38.1 cm (10 -15 in) outside of the 30.48-cm (12-in) walkway width. Requires more than 7 seconds to ambulate 6 m (20 ft).11.47 seconds with SPC  2.   CHANGE IN GAIT SPEED Instructions: Begin walking at your normal pace (for 1.5 m [5 ft]). When I tell you go, walk as fast as you can (for 1.5 m [5 ft]). When I tell you slow, walk as slowly as you can (for 1.5 m [5 ft]. (1) Moderate impairment - Makes only minor adjustments to walking speed, or accomplishes a change in speed with significant gait deviations, deviates 25.4 -38.1 cm (10 -15 in) outside the 30.48-cm (12-in) walkway width, or changes speed but loses balance but is able to recover and continue walking.  3.    GAIT WITH HORIZONTAL HEAD TURNS Instructions: Walk from here to the next mark 6 m (20 ft) away. Begin walking at your normal pace. Keep walking straight; after 3 steps, turn your head to the right and keep walking straight while looking to the right. After 3 more steps, turn your head to the left and keep walking straight while looking left.  Continue alternating looking right and left. (1) Moderate impairment - Performs head turns with moderate change in gait velocity, slows down, deviates 25.4 -38.1 cm (10 -15 in) outside 30.48-cm (12-in) walkway width but recovers, can continue to walk.  4.   GAIT WITH VERTICAL HEAD TURNS Instructions: Walk from here to the next mark (6 m [20 ft]). Begin walking at your normal pace. Keep walking straight; after 3 steps, tip your head up and keep walking straight while looking up. After 3 more steps, tip your head down, keep walking straight while looking down. Continue  alternating looking up and down every 3 steps until you have completed 2 repetitions in each direction. (2) Mild impairment - Performs task with slight change in gait velocity (eg, minor disruption to smooth gait path), deviates 15.24 -25.4 cm (6 -10 in) outside 30.48-cm (12-in) walkway width or uses assistive device.  5.  GAIT AND PIVOT TURN Instructions: Begin with walking at your normal pace. When I tell you, turn and stop, turn as quickly as you can to face the opposite direction and stop. (2) Mild impairment - Pivot turns safely in 3 seconds and stops with no loss of balance, or pivot turns safely within 3 seconds and stops with mild imbalance, requires small steps to catch balance  6.   STEP OVER OBSTACLE Instructions: Begin walking at your normal speed. When you come to the shoe box, step over it, not around it, and keep walking. (1) Moderate impairment - Is able to step over one shoe box (11.43 cm [4.5 in] total height) but must slow down and adjust steps to clear box safely. May require verbal cueing.  7.  GAIT WITH NARROW BASE OF SUPPORT Instructions: Walk on the floor with arms folded across the chest, feet aligned heel to toe in tandem for a distance of 3.6 m [12 ft]. The number of steps taken in a straight line are counted for a maximum of 10 steps. (0) Severe impairment - Ambulates less than 4 steps heel to toe or cannot  perform without assistance.  8.   GAIT WITH EYES CLOSED Instructions: Walk at your normal speed from here to the next mark (6 m [20 ft]) with your eyes closed. (1) Moderate impairment - Walks 6 m (20 ft), slow speed, abnormal gait pattern, evidence for imbalance, deviates 25.4 -38.1 cm (10 -15 in) outside 30.48-cm (12-in) walkway width. Requires more than 9 seconds to ambulate 6 m (20 ft). 19.56 seconds with SPC  9.   AMBULATING BACKWARDS Instructions: Walk backwards until I tell you to stop (1) Moderate impairment - Walks 6 m (20 ft), slow speed, abnormal gait pattern, evidence for imbalance, deviates 25.4 -38.1 cm (10 -15 in) outside 30.48-cm (12-in) walkway width.26.63 seconds  10. STEPS Instructions: Walk up these stairs as you would at home (ie, using the rail if necessary). At the top turn around and walk down. (1) Moderate impairment-Two feet to a stair; must use rail.  Alternating pattern ascending with R UE support on railing: step to pattern with B UE support on railing  Total 11/30   Interpretation of scores: Non-Specific Older Adults Cutoff Score: <=22/30 = risk of falls Parkinsons Disease Cutoff score <15/30= fall risk (Hoehn & Yahr 1-4)  Minimally Clinically Important Difference (MCID)  Stroke (acute, subacute, and chronic) = MDC: 4.2 points Vestibular (acute) = MDC: 6 points Community Dwelling Older Adults =  MCID: 4 points Parkinsons Disease  =  MDC: 4.3 points  (Academy of Neu[employer]ic Physical Therapy (nd). Functional Gait Assessment. Retrieved from https://www.neuropt.org/docs/default-source/cpgs/core-outcome-measures/function-gait-assessment-pocket-guide-proof9-(2).pdf?sfvrsn=b57f35043_0.)                                                                                                                          TREATMENT DATE: 07/30/24  Self Care: BP and HR taken in sitting at rest beginning of session (see below for details).  Pt reports not taking BP medication d/t not  eating yet this morning; pt also reporting rushing to get to appt this morning.  Pt asymptomatic.  Monitored for adverse symptoms during session. Vitals:   07/30/24 0932  BP: (!) 144/97  Pulse: 91  Pt education (see pt education below)  Therapeutic Exercise: SciFit multi-peaks up to level 3 for 8 minutes using BUE/BLEs for neural priming for reciprocal movement, dynamic cardiovascular warmup and increased amplitude of stepping. RPE of 5/10 following activity.  Average stride length 11.4 inches.  Terminal hip flexor strengthening (to improve foot clearance for gait); foot starting on 6 inch step (B UE support): x10 reps x2 sets B LE's Notes: initial vc's for technique (and not to extend with back to compensate)  Therapeutic Activity: Sit to stands  from (21 inch height) mat table; L foot on 4 inch step to increase R LE WB'ing: x5 reps with B UE support x10 reps x2 sets no UE support Notes: SBA; vc's for technique initially Sit to stands on [employer] from (23 inch height) mat table: x5 reps with B UE support x5 reps with no UE support Notes: vc's for equal WB'ing through B LE's intermittently; SBA   PATIENT EDUCATION: Education details: BP limits for therapy participation.  Continue HEP.   Person educated: Patient Education method: Explanation, Demonstration, Tactile cues, and Verbal cues Education comprehension: verbalized understanding, returned demonstration, verbal cues required, and needs further education  HOME EXERCISE PROGRAM: Access Code: [email or web address] URL: [email or web address] Date: 07/23/2024 Prepared by: Damien Caulk  Exercises - Seated March  - 1 x daily - 5 x weekly - 1-2 sets - 5-10 reps - Seated Long Arc Quad  - 1 x daily - 5 x weekly - 1-2 sets - 5-10 reps - Standing March with Counter Support  - 1 x daily - 5 x weekly - 1-2 sets - 10 reps - Standing Hip Abduction with Counter Support  - 1 x daily - 5 x weekly - 1-2 sets - 10 reps - Heel Raises with  Counter Support  - 1 x daily - 5 x weekly - 1-2 sets - 10 reps - Supine Bridge  - 1 x daily - 5 x weekly - 1-2 sets - 10 reps - Clamshell  - 1 x daily - 5 x weekly - 1-2 sets - 5-10 reps  GOALS: Goals reviewed with patient? Yes  SHORT TERM GOALS: Target date: 07/23/2024  Pt will be independent with initial HEP in order to improve strength and balance in order to decrease fall risk and improve function at home for ADL's. Baseline: Issued 06/25/24 Goal status: MET  2.  Pt will decrease 5 Time Sit to Stand by at least 3 seconds in order to demonstrate clinically significant improvement in LE strength. Baseline: 24.22 seconds on Eval; 17.07 seconds 07/23/24 Goal status: MET  3.  Assess FGA and update LTG as needed. Baseline: 11/30 on 06/29/24 Goal status: MET   LONG TERM GOALS: Target date: 08/20/2024  Pt will be independent with final HEP in order to improve strength and balance in order to decrease fall risk and improve function at home for ADL's. Baseline: TBA Goal status: INITIAL  2.  Pt will decrease 5 Time Sit to Stand by at least 3 seconds (from STG measurement) in order to demonstrate clinically significant improvement in LE strength. Baseline: 24.22 seconds on Eval; 17.07 seconds 07/23/24 Goal status: INITIAL  3.  Pt will increase by at least 0.13 m/s in order to demonstrate clinically significant improvement in community ambulation.  Baseline:  0.60 m/sec with SPC on Eval Goal status: INITIAL  4.  Pt will demonstrate improved R LE foot clearance for safety with gait. Baseline: decreased R LE foot clearance with fatigue on Eval Goal status: INITIAL  5.  Patient will increase Functional Gait Assessment score to > or = 20/30 as to reduce fall risk and improve dynamic gait safety with community ambulation. Baseline: 11/30 on 06/29/24 Goal status: INITIAL   ASSESSMENT:  CLINICAL IMPRESSION: Patient was seen today for physical therapy treatment to address strength  and transfers.  Focused session on global strengthening, increasing R LE WB'ing during transfers, and improving transfer technique and stability.  Patient continues to be limited by strength and balance.  They demonstrate improvement in R LE  WB'ing during transfers after cueing (instead of compensating with L LE).  They would continue to benefit from skilled PT to address impairments as noted and progress towards long term goals.  OBJECTIVE IMPAIRMENTS: Abnormal gait, cardiopulmonary status limiting activity, decreased activity tolerance, decreased balance, decreased coordination, decreased endurance, decreased knowledge of condition, decreased knowledge of use of DME, decreased mobility, difficulty walking, decreased ROM, decreased strength, increased muscle spasms, impaired flexibility, impaired sensation, impaired tone, improper body mechanics, postural dysfunction, and pain.   ACTIVITY LIMITATIONS: carrying, lifting, bending, standing, squatting, stairs, transfers, bathing, and locomotion level  PARTICIPATION LIMITATIONS: cleaning, laundry, shopping, community activity, and occupation  PERSONAL FACTORS: Past/current experiences, Time since onset of injury/illness/exacerbation, and 1-2 comorbidities: MS and CHF are also affecting patient's functional outcome.   REHAB POTENTIAL: Good  CLINICAL DECISION MAKING: Evolving/moderate complexity  EVALUATION COMPLEXITY: Moderate  PLAN:  PT FREQUENCY: 2x/week  PT DURATION: 8 weeks  PLANNED INTERVENTIONS: [id number]- PT Re-evaluation, 97750- Physical Performance Testing, 97110-Therapeutic exercises, 97530- Therapeutic activity, [id number]- Neuromuscular re-education, 97535- Self Care, [id number]- Manual therapy, [id number]- Gait training, [phone]- Orthotic Initial, [phone]- Orthotic/Prosthetic subsequent, [phone]- Aquatic Therapy, [phone]- Electrical stimulation (manual), [id number]- Ultrasound, [id number] (1-2 muscles), [id number] (3+ muscles)- Dry Needling, Patient/Family education, Balance  training, Stair training, Taping, Joint mobilization, Spinal mobilization, Compression bandaging, DME instructions, Cryotherapy, Moist heat, and Biofeedback  PLAN FOR NEXT SESSION: Check on HEP.  Aerobic tolerance, strengthening, balance (especially reaching to L side), coordination, stretching, improving R LE foot clearance with gait.   Damien Caulk, PT 07/30/2024, 10:24 AM        "

## 2024-08-03 ENCOUNTER — Encounter: Payer: Self-pay | Admitting: Physical Therapy

## 2024-08-03 ENCOUNTER — Ambulatory Visit: Admitting: Physical Therapy

## 2024-08-03 VITALS — BP 141/85 | HR 78

## 2024-08-03 DIAGNOSIS — M6281 Muscle weakness (generalized): Secondary | ICD-10-CM

## 2024-08-03 DIAGNOSIS — R2689 Other abnormalities of gait and mobility: Secondary | ICD-10-CM

## 2024-08-03 DIAGNOSIS — R29898 Other symptoms and signs involving the musculoskeletal system: Secondary | ICD-10-CM

## 2024-08-03 DIAGNOSIS — R29818 Other symptoms and signs involving the nervous system: Secondary | ICD-10-CM

## 2024-08-03 NOTE — Therapy (Signed)
 " OUTPATIENT PHYSICAL THERAPY NEURO TREATMENT   Patient Name: Amanda [name] MRN: [id number] DOB:1981/07/03, 43 y.o., female Today's Date: 08/03/2024   PCP: Alben Therisa MATSU, PA REFERRING PROVIDER: [name], MD  END OF SESSION:  PT End of Session - 08/03/24 0934     Visit Number 9    Number of Visits 17   16 visits plus Eval   Date for Recertification  09/03/24   for scheduling delays   Authorization Type [employer]    Authorization - Visit Number 9    Authorization - Number of Visits 30   30 (PT,OT)   PT Start Time 0933    PT Stop Time 1015    PT Time Calculation (min) 42 min    Equipment Utilized During Treatment Gait belt    Activity Tolerance No increased pain;Patient tolerated treatment well    Behavior During Therapy [facility] for tasks assessed/performed          Past Medical History:  Diagnosis Date   Anemia    BP (high blood pressure)    Chest pain    CHF (congestive heart failure) (HCC)    Edema, lower extremity    History of blood transfusion 08/2017   related to low blood count    Infertility, female    Joint pain    Multiple sclerosis    Pneumonia 12/2017   Shortness of breath    Swallowing difficulty    Vitamin D  deficiency    Past Surgical History:  Procedure Laterality Date   CARDIAC CATHETERIZATION  01/12/2018   Patient Active Problem List   Diagnosis Date Noted   Other iron  deficiency anemias 07/06/2024   Anemia 07/30/2022   Influenza A 07/30/2022   Essential hypertension 02/08/2020   Vitamin D  deficiency 02/08/2020   Depression 02/08/2020   History of anemia-  uterine fibroids 02/08/2020   History of uterine fibroid 02/08/2020   Chronic systolic CHF (congestive heart failure) (HCC) 02/08/2020   At risk for diabetes mellitus 02/08/2020   Acute systolic (congestive) heart failure (HCC) 01/09/2018   Idiopathic cardiomyopathy (HCC) 01/09/2018   Uterine fibroid 09/07/2017   Multiple sclerosis    Class 3 severe obesity with serious  comorbidity and body mass index (BMI) of 50.0 to 59.9 in adult [facility])     ONSET DATE: 06/14/2024  REFERRING DIAG: H64.A (ICD-10-CM) - Relapsing remitting multiple sclerosis R26.9 (ICD-10-CM) - Gait disturbance M62.838 (ICD-10-CM) - Muscle spasm of right leg  THERAPY DIAG:  Other abnormalities of gait and mobility  Muscle weakness (generalized)  Other symptoms and signs involving the nervous system  Other symptoms and signs involving the musculoskeletal system  Rationale for Evaluation and Treatment: Rehabilitation  SUBJECTIVE:  SUBJECTIVE STATEMENT: No recent falls.  Arrived ambulating with SPC.  No acute changes since last visit.  Reports 50% compliance with HEP d/t needing rest for her body. Pt accompanied by: self  PERTINENT HISTORY: Pt with relapsing remitting MS.  Worsening balance.  R leg weakness (R foot drop) and painful spasms R foot.  R flank down to R leg/foot numbness since Dec 2024 relapse.  L toes numb.  Blurry vision sometimes.  Wording finding difficulty new in past 6 months.  Taking Baclofen  TID (often sleepy).  PMH includes MS, chronic CHF, anemia, high BP, chest pain, LE edema, joint pain, PNA, SOB, swallowing difficulty, Vitamin D  deficiency, cardiac cath.  PAIN:  Are you having pain? 3-4/10 R LE pain (mild burning)  PRECAUTIONS: Fall  RED FLAGS: None   WEIGHT BEARING RESTRICTIONS: No  FALLS: Has patient fallen in last 6 months? No  LIVING ENVIRONMENT: Lives with: lives with their family (husband) Lives in: House/apartment 1 level house Stairs: level entry Has following equipment at home: Single point cane  PLOF: Modified independent ambulating with SPC in community; uses buggies at stores; no AD use in home  PATIENT GOALS: Get my balance back and get stability in  feet and legs.  OBJECTIVE:  Note: Objective measures were completed at Evaluation unless otherwise noted.  DIAGNOSTIC FINDINGS:  Per MRI Brain 02/23/24: Multiple T2/FLAIR hyperintense foci in the cerebral hemispheres, left thalamus, brainstem and cerebellum in a pattern consistent with demyelinating plaque associated with multiple sclerosis. Most of the foci appear to be chronic though 1 small focus in the left frontal lobe enhances after contrast consistent with acute demyelination. Compared to the MRI from 2016, there has been significant progression.  COGNITION: Overall cognitive status: Within functional limits for tasks assessed   SENSATION: Tingling/numbness R lower leg and foot; decreased light touch R lateral lower leg and foot  COORDINATION: Decreased quality R LE alternating rapid toe taps compared to L LE in sitting Unable to perform R LE heel to shin coordination d/t weakness in sitting (L LE appearing WFL)  EDEMA:  None noted  MUSCLE TONE: RLE: Mild and Hypertonic; no clonus noted R ankle  POSTURE: rounded shoulders and forward head  LOWER EXTREMITY ROM:     Active  Right Eval Left Eval  Hip flexion WFL WNL  Hip extension    Hip abduction    Hip adduction    Hip internal rotation    Hip external rotation    Knee flexion WNL WNL  Knee extension WNL WNL  Ankle dorsiflexion WNL WNL  Ankle plantarflexion WNL WNL  Ankle inversion    Ankle eversion     (Blank rows = not tested)  LOWER EXTREMITY MMT:    MMT Right Eval Left Eval  Hip flexion 3+/5 4+/5  Hip extension    Hip abduction    Hip adduction    Hip internal rotation    Hip external rotation    Knee flexion 4/5 5/5  Knee extension 4-/5 5/5  Ankle dorsiflexion 4/5 4+/5  Ankle plantarflexion At least 3/5 AROM At least 3/5 AROM  Ankle inversion    Ankle eversion    (Blank rows = not tested)  BED MOBILITY:  Pt reports no issues  TRANSFERS: Sit to stand: Modified independence  Assistive  device utilized: None     Stand to sit: Modified independence  Assistive device utilized: None      GAIT: Findings: Gait Characteristics: decreased R LE heel-strike and foot clearance, Distance walked: clinic  distances, Assistive device utilized:Single point cane, Level of assistance: SBA, and Comments: decreased R LE foot clearance and decreased gait speed with fatigue; R LE spasm post ambulation on  FUNCTIONAL TESTS:  5 times sit to stand: 24.22 seconds no UE support (increased effort and time to stand last 2 trials); 17.07 seconds (no UE support) 07/23/24 10 meter walk test: 0.60 m/sec with SPC R UE (16.53 seconds) FGA: 11/30 on 06/29/24  FUNCTIONAL GAIT ASSESSMENT  Date: 06/29/24 (with use of SPC R UE) Score  GAIT LEVEL SURFACE Instructions: Walk at your normal speed from here to the next mark (6 m) [20 ft]. (1) Moderate impairment-Walks 6 m (20 ft), slow speed, abnormal gait pattern, evidence for imbalance, or deviates 25.4 - 38.1 cm (10 -15 in) outside of the 30.48-cm (12-in) walkway width. Requires more than 7 seconds to ambulate 6 m (20 ft).11.47 seconds with SPC  2.   CHANGE IN GAIT SPEED Instructions: Begin walking at your normal pace (for 1.5 m [5 ft]). When I tell you go, walk as fast as you can (for 1.5 m [5 ft]). When I tell you slow, walk as slowly as you can (for 1.5 m [5 ft]. (1) Moderate impairment - Makes only minor adjustments to walking speed, or accomplishes a change in speed with significant gait deviations, deviates 25.4 -38.1 cm (10 -15 in) outside the 30.48-cm (12-in) walkway width, or changes speed but loses balance but is able to recover and continue walking.  3.    GAIT WITH HORIZONTAL HEAD TURNS Instructions: Walk from here to the next mark 6 m (20 ft) away. Begin walking at your normal pace. Keep walking straight; after 3 steps, turn your head to the right and keep walking straight while looking to the right. After 3 more steps, turn your head to the left  and keep walking straight while looking left. Continue alternating looking right and left. (1) Moderate impairment - Performs head turns with moderate change in gait velocity, slows down, deviates 25.4 -38.1 cm (10 -15 in) outside 30.48-cm (12-in) walkway width but recovers, can continue to walk.  4.   GAIT WITH VERTICAL HEAD TURNS Instructions: Walk from here to the next mark (6 m [20 ft]). Begin walking at your normal pace. Keep walking straight; after 3 steps, tip your head up and keep walking straight while looking up. After 3 more steps, tip your head down, keep walking straight while looking down. Continue  alternating looking up and down every 3 steps until you have completed 2 repetitions in each direction. (2) Mild impairment - Performs task with slight change in gait velocity (eg, minor disruption to smooth gait path), deviates 15.24 -25.4 cm (6 -10 in) outside 30.48-cm (12-in) walkway width or uses assistive device.  5.  GAIT AND PIVOT TURN Instructions: Begin with walking at your normal pace. When I tell you, turn and stop, turn as quickly as you can to face the opposite direction and stop. (2) Mild impairment - Pivot turns safely in 3 seconds and stops with no loss of balance, or pivot turns safely within 3 seconds and stops with mild imbalance, requires small steps to catch balance  6.   STEP OVER OBSTACLE Instructions: Begin walking at your normal speed. When you come to the shoe box, step over it, not around it, and keep walking. (1) Moderate impairment - Is able to step over one shoe box (11.43 cm [4.5 in] total height) but must slow down and adjust steps to clear  box safely. May require verbal cueing.  7.   GAIT WITH NARROW BASE OF SUPPORT Instructions: Walk on the floor with arms folded across the chest, feet aligned heel to toe in tandem for a distance of 3.6 m [12 ft]. The number of steps taken in a straight line are counted for a maximum of 10 steps. (0) Severe impairment - Ambulates  less than 4 steps heel to toe or cannot perform without assistance.  8.   GAIT WITH EYES CLOSED Instructions: Walk at your normal speed from here to the next mark (6 m [20 ft]) with your eyes closed. (1) Moderate impairment - Walks 6 m (20 ft), slow speed, abnormal gait pattern, evidence for imbalance, deviates 25.4 -38.1 cm (10 -15 in) outside 30.48-cm (12-in) walkway width. Requires more than 9 seconds to ambulate 6 m (20 ft). 19.56 seconds with SPC  9.   AMBULATING BACKWARDS Instructions: Walk backwards until I tell you to stop (1) Moderate impairment - Walks 6 m (20 ft), slow speed, abnormal gait pattern, evidence for imbalance, deviates 25.4 -38.1 cm (10 -15 in) outside 30.48-cm (12-in) walkway width.26.63 seconds  10. STEPS Instructions: Walk up these stairs as you would at home (ie, using the rail if necessary). At the top turn around and walk down. (1) Moderate impairment-Two feet to a stair; must use rail.  Alternating pattern ascending with R UE support on railing: step to pattern with B UE support on railing  Total 11/30   Interpretation of scores: Non-Specific Older Adults Cutoff Score: <=22/30 = risk of falls Parkinsons Disease Cutoff score <15/30= fall risk (Hoehn & Yahr 1-4)  Minimally Clinically Important Difference (MCID)  Stroke (acute, subacute, and chronic) = MDC: 4.2 points Vestibular (acute) = MDC: 6 points Community Dwelling Older Adults =  MCID: 4 points Parkinsons Disease  =  MDC: 4.3 points  (Academy of Neurologic Physical Therapy (nd). Functional Gait Assessment. Retrieved from https://www.neuropt.org/docs/default-source/cpgs/core-outcome-measures/function-gait-assessment-pocket-guide-proof9-(2).pdf?sfvrsn=b39f35043_0.)                                                                                                                          TREATMENT DATE: 08/03/24  Self Care: BP and HR taken in sitting at rest beginning of session (see below for details). Vitals:    08/03/24 0938  BP: (!) 141/85  Pulse: 78    Therapeutic Exercise: SciFit multi-peaks up to level 4 for 8 minutes using BUE/BLEs for neural priming for reciprocal movement, dynamic cardiovascular warmup and increased amplitude of stepping. RPE of 6.5/10 following activity.  Average stride length 11.4 inches.   Therapeutic Activity: Ambulation with 2# ankle weight on R LE (to improve R foot clearance): x115 feet x2 trials CGA; use of SPC in R UE Toe taps to 6 inch step (at stairs) with 2# ankle weights B: x5 reps x3 sets B LE's Notes: no UE support; CGA for safety; increased difficulty noted R LE compared to L LE Ambulation (no ankle weight) x115 feet with SPC use R  UE (improved R LE foot clearance noted) Sit to stands from mat table on [employer]: x10 reps (no UE support) Notes: CGA; vc's to increase R LE WB'ing with fatigue (d/t pt starting to use L LE more with fatigue)   PATIENT EDUCATION: Education details: Continue HEP.   Person educated: Patient Education method: Explanation, Demonstration, Tactile cues, and Verbal cues Education comprehension: verbalized understanding, returned demonstration, verbal cues required, and needs further education  HOME EXERCISE PROGRAM: Access Code: [email or web address] URL: [email or web address] Date: 07/23/2024 Prepared by: Damien Caulk  Exercises - Seated March  - 1 x daily - 5 x weekly - 1-2 sets - 5-10 reps - Seated Long Arc Quad  - 1 x daily - 5 x weekly - 1-2 sets - 5-10 reps - Standing March with Counter Support  - 1 x daily - 5 x weekly - 1-2 sets - 10 reps - Standing Hip Abduction with Counter Support  - 1 x daily - 5 x weekly - 1-2 sets - 10 reps - Heel Raises with Counter Support  - 1 x daily - 5 x weekly - 1-2 sets - 10 reps - Supine Bridge  - 1 x daily - 5 x weekly - 1-2 sets - 10 reps - Clamshell  - 1 x daily - 5 x weekly - 1-2 sets - 5-10 reps  GOALS: Goals reviewed with patient? Yes  SHORT TERM GOALS: Target date:  07/23/2024  Pt will be independent with initial HEP in order to improve strength and balance in order to decrease fall risk and improve function at home for ADL's. Baseline: Issued 06/25/24 Goal status: MET  2.  Pt will decrease 5 Time Sit to Stand by at least 3 seconds in order to demonstrate clinically significant improvement in LE strength. Baseline: 24.22 seconds on Eval; 17.07 seconds 07/23/24 Goal status: MET  3.  Assess FGA and update LTG as needed. Baseline: 11/30 on 06/29/24 Goal status: MET   LONG TERM GOALS: Target date: 08/20/2024  Pt will be independent with final HEP in order to improve strength and balance in order to decrease fall risk and improve function at home for ADL's. Baseline: TBA Goal status: INITIAL  2.  Pt will decrease 5 Time Sit to Stand by at least 3 seconds (from STG measurement) in order to demonstrate clinically significant improvement in LE strength. Baseline: 24.22 seconds on Eval; 17.07 seconds 07/23/24 Goal status: INITIAL  3.  Pt will increase by at least 0.13 m/s in order to demonstrate clinically significant improvement in community ambulation.  Baseline:  0.60 m/sec with SPC on Eval Goal status: INITIAL  4.  Pt will demonstrate improved R LE foot clearance for safety with gait. Baseline: decreased R LE foot clearance with fatigue on Eval Goal status: INITIAL  5.  Patient will increase Functional Gait Assessment score to > or = 20/30 as to reduce fall risk and improve dynamic gait safety with community ambulation. Baseline: 11/30 on 06/29/24 Goal status: INITIAL   ASSESSMENT:  CLINICAL IMPRESSION: Patient was seen today for physical therapy treatment to address strength, transfers, and gait.  Focused session on global strengthening, improving R foot clearance (use of ankle weight), and improving ease of transfers (sit to stands on compliant surface; no UE support).  Patient continues to be limited by strength and balance.  They  demonstrate improvement in R LE foot clearance during ambulation after use of R LE ankle weight.  They would continue to benefit from skilled PT to address impairments as  noted and progress towards long term goals.  OBJECTIVE IMPAIRMENTS: Abnormal gait, cardiopulmonary status limiting activity, decreased activity tolerance, decreased balance, decreased coordination, decreased endurance, decreased knowledge of condition, decreased knowledge of use of DME, decreased mobility, difficulty walking, decreased ROM, decreased strength, increased muscle spasms, impaired flexibility, impaired sensation, impaired tone, improper body mechanics, postural dysfunction, and pain.   ACTIVITY LIMITATIONS: carrying, lifting, bending, standing, squatting, stairs, transfers, bathing, and locomotion level  PARTICIPATION LIMITATIONS: cleaning, laundry, shopping, community activity, and occupation  PERSONAL FACTORS: Past/current experiences, Time since onset of injury/illness/exacerbation, and 1-2 comorbidities: MS and CHF are also affecting patient's functional outcome.   REHAB POTENTIAL: Good  CLINICAL DECISION MAKING: Evolving/moderate complexity  EVALUATION COMPLEXITY: Moderate  PLAN:  PT FREQUENCY: 2x/week  PT DURATION: 8 weeks  PLANNED INTERVENTIONS: [id number]- PT Re-evaluation, 97750- Physical Performance Testing, 97110-Therapeutic exercises, 97530- Therapeutic activity, [id number]- Neuromuscular re-education, 97535- Self Care, [id number]- Manual therapy, [id number]- Gait training, [phone]- Orthotic Initial, [phone]- Orthotic/Prosthetic subsequent, [phone]- Aquatic Therapy, [phone]- Electrical stimulation (manual), [id number]- Ultrasound, [phone] (1-2 muscles), [id number] (3+ muscles)- Dry Needling, Patient/Family education, Balance training, Stair training, Taping, Joint mobilization, Spinal mobilization, Compression bandaging, DME instructions, Cryotherapy, Moist heat, and Biofeedback  PLAN FOR NEXT SESSION: 10th visit.  Check on HEP.  Aerobic  tolerance, strengthening, balance (especially reaching to L side), coordination, stretching, improving R LE foot clearance with gait.   Damien Caulk, PT 08/03/2024, 4:39 PM        "

## 2024-08-10 ENCOUNTER — Ambulatory Visit: Admitting: Physical Therapy

## 2024-08-13 ENCOUNTER — Ambulatory Visit: Admitting: Physical Therapy

## 2024-08-17 ENCOUNTER — Encounter: Payer: Self-pay | Admitting: Physical Therapy

## 2024-08-17 ENCOUNTER — Ambulatory Visit: Attending: Neurology | Admitting: Physical Therapy

## 2024-08-17 VITALS — BP 129/79 | HR 87

## 2024-08-17 DIAGNOSIS — R2689 Other abnormalities of gait and mobility: Secondary | ICD-10-CM | POA: Diagnosis present

## 2024-08-17 DIAGNOSIS — M6281 Muscle weakness (generalized): Secondary | ICD-10-CM | POA: Diagnosis present

## 2024-08-17 DIAGNOSIS — R29898 Other symptoms and signs involving the musculoskeletal system: Secondary | ICD-10-CM | POA: Diagnosis present

## 2024-08-17 DIAGNOSIS — R29818 Other symptoms and signs involving the nervous system: Secondary | ICD-10-CM | POA: Diagnosis present

## 2024-08-17 NOTE — Therapy (Signed)
 " OUTPATIENT PHYSICAL THERAPY NEURO TREATMENT  Physical Therapy Progress Note   Dates of Reporting Period: 06/25/24 - 08/17/24  See Note below for Objective Data and Assessment of Progress/Goals.  Thank you for the referral of this patient. Damien Caulk, PT   Patient Name: Amanda [name] MRN: [id number] DOB:24-Feb-1981, 44 y.o., female Today's Date: 08/17/2024   PCP: Alben Therisa MATSU, PA REFERRING PROVIDER: [name], MD  END OF SESSION:  PT End of Session - 08/17/24 0932     Visit Number 10    Number of Visits 17   16 visits plus Eval   Date for Recertification  09/03/24   for scheduling delays   Authorization Type [employer]    Authorization - Visit Number 10    Authorization - Number of Visits 30   30 (PT,OT)   PT Start Time 0930    PT Stop Time 1024    PT Time Calculation (min) 54 min    Equipment Utilized During Treatment Gait belt    Activity Tolerance No increased pain;Patient tolerated treatment well    Behavior During Therapy [facility] for tasks assessed/performed          Past Medical History:  Diagnosis Date   Anemia    BP (high blood pressure)    Chest pain    CHF (congestive heart failure) (HCC)    Edema, lower extremity    History of blood transfusion 08/2017   related to low blood count    Infertility, female    Joint pain    Multiple sclerosis    Pneumonia 12/2017   Shortness of breath    Swallowing difficulty    Vitamin D  deficiency    Past Surgical History:  Procedure Laterality Date   CARDIAC CATHETERIZATION  01/12/2018   Patient Active Problem List   Diagnosis Date Noted   Other iron  deficiency anemias 07/06/2024   Anemia 07/30/2022   Influenza A 07/30/2022   Essential hypertension 02/08/2020   Vitamin D  deficiency 02/08/2020   Depression 02/08/2020   History of anemia-  uterine fibroids 02/08/2020   History of uterine fibroid 02/08/2020   Chronic systolic CHF (congestive heart failure) (HCC) 02/08/2020   At risk for diabetes  mellitus 02/08/2020   Acute systolic (congestive) heart failure (HCC) 01/09/2018   Idiopathic cardiomyopathy (HCC) 01/09/2018   Uterine fibroid 09/07/2017   Multiple sclerosis    Class 3 severe obesity with serious comorbidity and body mass index (BMI) of 50.0 to 59.9 in adult [facility])     ONSET DATE: 06/14/2024  REFERRING DIAG: H64.A (ICD-10-CM) - Relapsing remitting multiple sclerosis R26.9 (ICD-10-CM) - Gait disturbance M62.838 (ICD-10-CM) - Muscle spasm of right leg  THERAPY DIAG:  Other abnormalities of gait and mobility  Muscle weakness (generalized)  Other symptoms and signs involving the nervous system  Other symptoms and signs involving the musculoskeletal system  Rationale for Evaluation and Treatment: Rehabilitation  SUBJECTIVE:  SUBJECTIVE STATEMENT: Pt reports having new insurance this year [employer]) but hasn't received insurance cards yet.  No recent falls.  Arrived ambulating with SPC.  No pain today (had a lot this weekend though).  No acute changes.  Pt reports 50% compliance with HEP but will do walking instead of HEP so is being active. Pt accompanied by: self  PERTINENT HISTORY: Pt with relapsing remitting MS.  Worsening balance.  R leg weakness (R foot drop) and painful spasms R foot.  R flank down to R leg/foot numbness since Dec 2024 relapse.  L toes numb.  Blurry vision sometimes.  Wording finding difficulty new in past 6 months.  Taking Baclofen  TID (often sleepy).  PMH includes MS, chronic CHF, anemia, high BP, chest pain, LE edema, joint pain, PNA, SOB, swallowing difficulty, Vitamin D  deficiency, cardiac cath.  PAIN:  Are you having pain? 0/10 R LE pain (mild burning)  PRECAUTIONS: Fall  RED FLAGS: None   WEIGHT BEARING RESTRICTIONS: No  FALLS: Has patient fallen  in last 6 months? No  LIVING ENVIRONMENT: Lives with: lives with their family (husband) Lives in: House/apartment 1 level house Stairs: level entry Has following equipment at home: Single point cane  PLOF: Modified independent ambulating with SPC in community; uses buggies at stores; no AD use in home  PATIENT GOALS: Get my balance back and get stability in feet and legs.  OBJECTIVE:  Note: Objective measures were completed at Evaluation unless otherwise noted.  DIAGNOSTIC FINDINGS:  Per MRI Brain 02/23/24: Multiple T2/FLAIR hyperintense foci in the cerebral hemispheres, left thalamus, brainstem and cerebellum in a pattern consistent with demyelinating plaque associated with multiple sclerosis. Most of the foci appear to be chronic though 1 small focus in the left frontal lobe enhances after contrast consistent with acute demyelination. Compared to the MRI from 2016, there has been significant progression.  COGNITION: Overall cognitive status: Within functional limits for tasks assessed   SENSATION: Tingling/numbness R lower leg and foot; decreased light touch R lateral lower leg and foot  COORDINATION: Decreased quality R LE alternating rapid toe taps compared to L LE in sitting Unable to perform R LE heel to shin coordination d/t weakness in sitting (L LE appearing WFL)  EDEMA:  None noted  MUSCLE TONE: RLE: Mild and Hypertonic; no clonus noted R ankle  POSTURE: rounded shoulders and forward head  LOWER EXTREMITY ROM:     Active  Right Eval Left Eval  Hip flexion WFL WNL  Hip extension    Hip abduction    Hip adduction    Hip internal rotation    Hip external rotation    Knee flexion WNL WNL  Knee extension WNL WNL  Ankle dorsiflexion WNL WNL  Ankle plantarflexion WNL WNL  Ankle inversion    Ankle eversion     (Blank rows = not tested)  LOWER EXTREMITY MMT:    MMT Right Eval Left Eval  Hip flexion 3+/5 4+/5  Hip extension    Hip abduction    Hip  adduction    Hip internal rotation    Hip external rotation    Knee flexion 4/5 5/5  Knee extension 4-/5 5/5  Ankle dorsiflexion 4/5 4+/5  Ankle plantarflexion At least 3/5 AROM At least 3/5 AROM  Ankle inversion    Ankle eversion    (Blank rows = not tested)  BED MOBILITY:  Pt reports no issues  TRANSFERS: Sit to stand: Modified independence  Assistive device utilized: None  Stand to sit: Modified independence  Assistive device utilized: None      GAIT: Findings: Gait Characteristics: decreased R LE heel-strike and foot clearance, Distance walked: clinic distances, Assistive device utilized:Single point cane, Level of assistance: SBA, and Comments: decreased R LE foot clearance and decreased gait speed with fatigue; R LE spasm post ambulation on  FUNCTIONAL TESTS:  5 times sit to stand: 24.22 seconds no UE support (increased effort and time to stand last 2 trials); 17.07 seconds (no UE support) 07/23/24 10 meter walk test: 0.60 m/sec with SPC R UE (16.53 seconds); 0.83 m/sec with SPC R UE 08/17/24 FGA: 11/30 on 06/29/24  FUNCTIONAL GAIT ASSESSMENT  Date: 06/29/24 (with use of SPC R UE) Score  GAIT LEVEL SURFACE Instructions: Walk at your normal speed from here to the next mark (6 m) [20 ft]. (1) Moderate impairment-Walks 6 m (20 ft), slow speed, abnormal gait pattern, evidence for imbalance, or deviates 25.4 - 38.1 cm (10 -15 in) outside of the 30.48-cm (12-in) walkway width. Requires more than 7 seconds to ambulate 6 m (20 ft).11.47 seconds with SPC  2.   CHANGE IN GAIT SPEED Instructions: Begin walking at your normal pace (for 1.5 m [5 ft]). When I tell you go, walk as fast as you can (for 1.5 m [5 ft]). When I tell you slow, walk as slowly as you can (for 1.5 m [5 ft]. (1) Moderate impairment - Makes only minor adjustments to walking speed, or accomplishes a change in speed with significant gait deviations, deviates 25.4 -38.1 cm (10 -15 in) outside the 30.48-cm (12-in)  walkway width, or changes speed but loses balance but is able to recover and continue walking.  3.    GAIT WITH HORIZONTAL HEAD TURNS Instructions: Walk from here to the next mark 6 m (20 ft) away. Begin walking at your normal pace. Keep walking straight; after 3 steps, turn your head to the right and keep walking straight while looking to the right. After 3 more steps, turn your head to the left and keep walking straight while looking left. Continue alternating looking right and left. (1) Moderate impairment - Performs head turns with moderate change in gait velocity, slows down, deviates 25.4 -38.1 cm (10 -15 in) outside 30.48-cm (12-in) walkway width but recovers, can continue to walk.  4.   GAIT WITH VERTICAL HEAD TURNS Instructions: Walk from here to the next mark (6 m [20 ft]). Begin walking at your normal pace. Keep walking straight; after 3 steps, tip your head up and keep walking straight while looking up. After 3 more steps, tip your head down, keep walking straight while looking down. Continue  alternating looking up and down every 3 steps until you have completed 2 repetitions in each direction. (2) Mild impairment - Performs task with slight change in gait velocity (eg, minor disruption to smooth gait path), deviates 15.24 -25.4 cm (6 -10 in) outside 30.48-cm (12-in) walkway width or uses assistive device.  5.  GAIT AND PIVOT TURN Instructions: Begin with walking at your normal pace. When I tell you, turn and stop, turn as quickly as you can to face the opposite direction and stop. (2) Mild impairment - Pivot turns safely in 3 seconds and stops with no loss of balance, or pivot turns safely within 3 seconds and stops with mild imbalance, requires small steps to catch balance  6.   STEP OVER OBSTACLE Instructions: Begin walking at your normal speed. When you come to the shoe box,  step over it, not around it, and keep walking. (1) Moderate impairment - Is able to step over one shoe box (11.43 cm  [4.5 in] total height) but must slow down and adjust steps to clear box safely. May require verbal cueing.  7.   GAIT WITH NARROW BASE OF SUPPORT Instructions: Walk on the floor with arms folded across the chest, feet aligned heel to toe in tandem for a distance of 3.6 m [12 ft]. The number of steps taken in a straight line are counted for a maximum of 10 steps. (0) Severe impairment - Ambulates less than 4 steps heel to toe or cannot perform without assistance.  8.   GAIT WITH EYES CLOSED Instructions: Walk at your normal speed from here to the next mark (6 m [20 ft]) with your eyes closed. (1) Moderate impairment - Walks 6 m (20 ft), slow speed, abnormal gait pattern, evidence for imbalance, deviates 25.4 -38.1 cm (10 -15 in) outside 30.48-cm (12-in) walkway width. Requires more than 9 seconds to ambulate 6 m (20 ft). 19.56 seconds with SPC  9.   AMBULATING BACKWARDS Instructions: Walk backwards until I tell you to stop (1) Moderate impairment - Walks 6 m (20 ft), slow speed, abnormal gait pattern, evidence for imbalance, deviates 25.4 -38.1 cm (10 -15 in) outside 30.48-cm (12-in) walkway width.26.63 seconds  10. STEPS Instructions: Walk up these stairs as you would at home (ie, using the rail if necessary). At the top turn around and walk down. (1) Moderate impairment-Two feet to a stair; must use rail.  Alternating pattern ascending with R UE support on railing: step to pattern with B UE support on railing  Total 11/30   Interpretation of scores: Non-Specific Older Adults Cutoff Score: <=22/30 = risk of falls Parkinsons Disease Cutoff score <15/30= fall risk (Hoehn & Yahr 1-4)  Minimally Clinically Important Difference (MCID)  Stroke (acute, subacute, and chronic) = MDC: 4.2 points Vestibular (acute) = MDC: 6 points Community Dwelling Older Adults =  MCID: 4 points Parkinsons Disease  =  MDC: 4.3 points  (Academy of Neurologic Physical Therapy (nd). Functional Gait Assessment. Retrieved  from https://www.neuropt.org/docs/default-source/cpgs/core-outcome-measures/function-gait-assessment-pocket-guide-proof9-(2).pdf?sfvrsn=b50f35043_0.)                                                                                                                          TREATMENT DATE: 08/17/2024  Self Care: BP and HR taken in sitting at rest beginning of session (see below for details). Vitals:   08/17/24 0933  BP: 129/79  Pulse: 87    Therapeutic Exercise: SciFit multi-peaks up to level 4 for 8 minutes using BUE/BLEs for neural priming for reciprocal movement, global strengthening, dynamic cardiovascular warmup and increased amplitude of stepping. RPE of 8/10 following activity.  Average stride length 11.1 inches.   Therapeutic Activity: :  0.83 m/sec (11.97 seconds with SPC use) Standing balance in // bars: 6 foot foam balance beam: Tandem walking x6 reps heel to toe Notes: intermittent UE support for balance;  SBA; increased time to perform (pt cautious and concentrating on task) Side stepping (L then R = 1 rep) with forward toe taps to gum drops (4 gum drops total) with B LE's: x2 reps Notes: intermittent progressing to occasional UE support for balance; SBA; increased time to perform (pt cautious and concentrating on task) SLS (on Airex): 30 seconds x2 B LE's Notes: mostly single UE support required but occasional no UE support; SBA Ambulation (2# ankle weight R LE) no AD use (to improve balance and R foot clearance for ambulation): x230 feet; CGA Notes: mild R LE fatigue with increased distance ambulating but still able to clear R foot   PATIENT EDUCATION: Education details: Continue HEP.   Person educated: Patient Education method: Explanation, Demonstration, Tactile cues, and Verbal cues Education comprehension: verbalized understanding, returned demonstration, verbal cues required, and needs further education  HOME EXERCISE PROGRAM: Access Code: [email or web address] URL:  [email or web address] Date: 07/23/2024 Prepared by: Damien Caulk  Exercises - Seated March  - 1 x daily - 5 x weekly - 1-2 sets - 5-10 reps - Seated Long Arc Quad  - 1 x daily - 5 x weekly - 1-2 sets - 5-10 reps - Standing March with Counter Support  - 1 x daily - 5 x weekly - 1-2 sets - 10 reps - Standing Hip Abduction with Counter Support  - 1 x daily - 5 x weekly - 1-2 sets - 10 reps - Heel Raises with Counter Support  - 1 x daily - 5 x weekly - 1-2 sets - 10 reps - Supine Bridge  - 1 x daily - 5 x weekly - 1-2 sets - 10 reps - Clamshell  - 1 x daily - 5 x weekly - 1-2 sets - 5-10 reps  GOALS: Goals reviewed with patient? Yes  SHORT TERM GOALS: Target date: 07/23/2024  Pt will be independent with initial HEP in order to improve strength and balance in order to decrease fall risk and improve function at home for ADL's. Baseline: Issued 06/25/24 Goal status: MET  2.  Pt will decrease 5 Time Sit to Stand by at least 3 seconds in order to demonstrate clinically significant improvement in LE strength. Baseline: 24.22 seconds on Eval; 17.07 seconds 07/23/24 Goal status: MET  3.  Assess FGA and update LTG as needed. Baseline: 11/30 on 06/29/24 Goal status: MET   LONG TERM GOALS: Target date: 08/20/2024  Pt will be independent with final HEP in order to improve strength and balance in order to decrease fall risk and improve function at home for ADL's. Baseline: TBA Goal status: INITIAL  2.  Pt will decrease 5 Time Sit to Stand by at least 3 seconds (from STG measurement) in order to demonstrate clinically significant improvement in LE strength. Baseline: 24.22 seconds on Eval; 17.07 seconds 07/23/24 Goal status: INITIAL  3.  Pt will increase by at least 0.13 m/s in order to demonstrate clinically significant improvement in community ambulation.  Baseline:  0.60 m/sec with SPC on Eval; 0.83 m/sec with [facility] 08/17/24 Goal status: MET  4.  Pt will demonstrate  improved R LE foot clearance for safety with gait. Baseline: decreased R LE foot clearance with fatigue on Eval Goal status: INITIAL  5.  Patient will increase Functional Gait Assessment score to > or = 20/30 as to reduce fall risk and improve dynamic gait safety with community ambulation. Baseline: 11/30 on 06/29/24 Goal status: INITIAL   ASSESSMENT:  CLINICAL IMPRESSION: Patient was seen today for physical  therapy treatment to address strength, outcome measure, balance, and R foot clearance.  Focused session on global strengthening, ,  balance on compliant surface, and improving R foot clearance during ambulation (using 2# ankle weight R LE).  Pt scored 0.83 m/sec on the 10 Meter Walk Test indicating pt is a community ambulatory (Cut off scores: <0.4 m/s = household Ambulator, 0.4-0.8 m/s = limited community Ambulator, >0.8 m/s = community Ambulator, >1.2 m/s = crossing a street, <1.0 = increased fall risk); improved from 0.60 m/sec on Eval; LTG #3 met.  Patient continues to be limited by balance and strength.  They demonstrate improvement in gait speed as noted by .  They would continue to benefit from skilled PT to address impairments as noted and progress towards long term goals.  OBJECTIVE IMPAIRMENTS: Abnormal gait, cardiopulmonary status limiting activity, decreased activity tolerance, decreased balance, decreased coordination, decreased endurance, decreased knowledge of condition, decreased knowledge of use of DME, decreased mobility, difficulty walking, decreased ROM, decreased strength, increased muscle spasms, impaired flexibility, impaired sensation, impaired tone, improper body mechanics, postural dysfunction, and pain.   ACTIVITY LIMITATIONS: carrying, lifting, bending, standing, squatting, stairs, transfers, bathing, and locomotion level  PARTICIPATION LIMITATIONS: cleaning, laundry, shopping, community activity, and occupation  PERSONAL FACTORS: Past/current  experiences, Time since onset of injury/illness/exacerbation, and 1-2 comorbidities: MS and CHF are also affecting patient's functional outcome.   REHAB POTENTIAL: Good  CLINICAL DECISION MAKING: Evolving/moderate complexity  EVALUATION COMPLEXITY: Moderate  PLAN:  PT FREQUENCY: 2x/week  PT DURATION: 8 weeks  PLANNED INTERVENTIONS: 97164- PT Re-evaluation, 97750- Physical Performance Testing, 97110-Therapeutic exercises, 97530- Therapeutic activity, [id number]- Neuromuscular re-education, 97535- Self Care, [id number]- Manual therapy, [id number]- Gait training, [phone]- Orthotic Initial, [phone]- Orthotic/Prosthetic subsequent, [phone]- Aquatic Therapy, [phone]- Electrical stimulation (manual), [id number]- Ultrasound, [id number] (1-2 muscles), [id number] (3+ muscles)- Dry Needling, Patient/Family education, Balance training, Stair training, Taping, Joint mobilization, Spinal mobilization, Compression bandaging, DME instructions, Cryotherapy, Moist heat, and Biofeedback  PLAN FOR NEXT SESSION: LTG's due.  Check on HEP.  Aerobic tolerance, strengthening, balance (especially reaching to L side), coordination, stretching, improving R LE foot clearance with gait.   Damien Caulk, PT 08/17/2024, 10:35 AM        "

## 2024-08-20 ENCOUNTER — Encounter: Payer: Self-pay | Admitting: Physical Therapy

## 2024-08-20 ENCOUNTER — Ambulatory Visit: Admitting: Physical Therapy

## 2024-08-20 VITALS — BP 143/79 | HR 82

## 2024-08-20 DIAGNOSIS — R2689 Other abnormalities of gait and mobility: Secondary | ICD-10-CM | POA: Diagnosis not present

## 2024-08-20 DIAGNOSIS — R29898 Other symptoms and signs involving the musculoskeletal system: Secondary | ICD-10-CM

## 2024-08-20 DIAGNOSIS — M6281 Muscle weakness (generalized): Secondary | ICD-10-CM

## 2024-08-20 DIAGNOSIS — R29818 Other symptoms and signs involving the nervous system: Secondary | ICD-10-CM

## 2024-08-20 NOTE — Therapy (Signed)
 " OUTPATIENT PHYSICAL THERAPY NEURO TREATMENT   Patient Name: Amanda [name] MRN: [id number] DOB:1980-10-09, 44 y.o., female Today's Date: 08/20/2024   PCP: Alben Therisa MATSU, PA REFERRING PROVIDER: [name], MD  END OF SESSION:  PT End of Session - 08/20/24 0933     Visit Number 11    Number of Visits 17   16 visits plus Eval   Date for Recertification  09/03/24   for scheduling delays   Authorization Type [employer]    Authorization - Visit Number 11    Authorization - Number of Visits 30   30 (PT,OT)   PT Start Time 0931    PT Stop Time 1015    PT Time Calculation (min) 44 min    Equipment Utilized During Treatment Gait belt    Activity Tolerance No increased pain;Patient tolerated treatment well    Behavior During Therapy [facility] for tasks assessed/performed          Past Medical History:  Diagnosis Date   Anemia    BP (high blood pressure)    Chest pain    CHF (congestive heart failure) (HCC)    Edema, lower extremity    History of blood transfusion 08/2017   related to low blood count    Infertility, female    Joint pain    Multiple sclerosis    Pneumonia 12/2017   Shortness of breath    Swallowing difficulty    Vitamin D  deficiency    Past Surgical History:  Procedure Laterality Date   CARDIAC CATHETERIZATION  01/12/2018   Patient Active Problem List   Diagnosis Date Noted   Other iron  deficiency anemias 07/06/2024   Anemia 07/30/2022   Influenza A 07/30/2022   Essential hypertension 02/08/2020   Vitamin D  deficiency 02/08/2020   Depression 02/08/2020   History of anemia-  uterine fibroids 02/08/2020   History of uterine fibroid 02/08/2020   Chronic systolic CHF (congestive heart failure) (HCC) 02/08/2020   At risk for diabetes mellitus 02/08/2020   Acute systolic (congestive) heart failure (HCC) 01/09/2018   Idiopathic cardiomyopathy (HCC) 01/09/2018   Uterine fibroid 09/07/2017   Multiple sclerosis    Class 3 severe obesity with serious  comorbidity and body mass index (BMI) of 50.0 to 59.9 in adult [facility])     ONSET DATE: 06/14/2024  REFERRING DIAG: H64.A (ICD-10-CM) - Relapsing remitting multiple sclerosis R26.9 (ICD-10-CM) - Gait disturbance M62.838 (ICD-10-CM) - Muscle spasm of right leg  THERAPY DIAG:  Other abnormalities of gait and mobility  Muscle weakness (generalized)  Other symptoms and signs involving the nervous system  Other symptoms and signs involving the musculoskeletal system  Rationale for Evaluation and Treatment: Rehabilitation  SUBJECTIVE:  SUBJECTIVE STATEMENT: Pt reports no recent falls.  8/10 R knee and leg pain beginning of session; 7/10 end of session.  Arrived ambulating with SPC.  HEP going good; no questions reported. Pt accompanied by: self  PERTINENT HISTORY: Pt with relapsing remitting MS.  Worsening balance.  R leg weakness (R foot drop) and painful spasms R foot.  R flank down to R leg/foot numbness since Dec 2024 relapse.  L toes numb.  Blurry vision sometimes.  Wording finding difficulty new in past 6 months.  Taking Baclofen  TID (often sleepy).  PMH includes MS, chronic CHF, anemia, high BP, chest pain, LE edema, joint pain, PNA, SOB, swallowing difficulty, Vitamin D  deficiency, cardiac cath.  PAIN:  Are you having pain? 8/10 R LE pain (burning sensation; typical symptom for pt)  PRECAUTIONS: Fall  RED FLAGS: None   WEIGHT BEARING RESTRICTIONS: No  FALLS: Has patient fallen in last 6 months? No  LIVING ENVIRONMENT: Lives with: lives with their family (husband) Lives in: House/apartment 1 level house Stairs: level entry Has following equipment at home: Single point cane  PLOF: Modified independent ambulating with SPC in community; uses buggies at stores; no AD use in home  PATIENT  GOALS: Get my balance back and get stability in feet and legs.  OBJECTIVE:  Note: Objective measures were completed at Evaluation unless otherwise noted.  DIAGNOSTIC FINDINGS:  Per MRI Brain 02/23/24: Multiple T2/FLAIR hyperintense foci in the cerebral hemispheres, left thalamus, brainstem and cerebellum in a pattern consistent with demyelinating plaque associated with multiple sclerosis. Most of the foci appear to be chronic though 1 small focus in the left frontal lobe enhances after contrast consistent with acute demyelination. Compared to the MRI from 2016, there has been significant progression.  COGNITION: Overall cognitive status: Within functional limits for tasks assessed   SENSATION: Tingling/numbness R lower leg and foot; decreased light touch R lateral lower leg and foot  COORDINATION: Decreased quality R LE alternating rapid toe taps compared to L LE in sitting Unable to perform R LE heel to shin coordination d/t weakness in sitting (L LE appearing WFL)  EDEMA:  None noted  MUSCLE TONE: RLE: Mild and Hypertonic; no clonus noted R ankle  POSTURE: rounded shoulders and forward head  LOWER EXTREMITY ROM:     Active  Right Eval Left Eval  Hip flexion WFL WNL  Hip extension    Hip abduction    Hip adduction    Hip internal rotation    Hip external rotation    Knee flexion WNL WNL  Knee extension WNL WNL  Ankle dorsiflexion WNL WNL  Ankle plantarflexion WNL WNL  Ankle inversion    Ankle eversion     (Blank rows = not tested)  LOWER EXTREMITY MMT:    MMT Right Eval Left Eval  Hip flexion 3+/5 4+/5  Hip extension    Hip abduction    Hip adduction    Hip internal rotation    Hip external rotation    Knee flexion 4/5 5/5  Knee extension 4-/5 5/5  Ankle dorsiflexion 4/5 4+/5  Ankle plantarflexion At least 3/5 AROM At least 3/5 AROM  Ankle inversion    Ankle eversion    (Blank rows = not tested)  BED MOBILITY:  Pt reports no  issues  TRANSFERS: Sit to stand: Modified independence  Assistive device utilized: None     Stand to sit: Modified independence  Assistive device utilized: None      GAIT: Findings: Gait Characteristics: decreased R  LE heel-strike and foot clearance, Distance walked: clinic distances, Assistive device utilized:Single point cane, Level of assistance: SBA, and Comments: decreased R LE foot clearance and decreased gait speed with fatigue; R LE spasm post ambulation on  FUNCTIONAL TESTS:  5 times sit to stand: 24.22 seconds no UE support (increased effort and time to stand last 2 trials); 17.07 seconds (no UE support) 07/23/24; 14.16 seconds (no UE support) 08/20/24 10 meter walk test: 0.60 m/sec with SPC R UE (16.53 seconds); 0.83 m/sec with SPC R UE 08/17/24 FGA: 11/30 on 06/29/24; 15/30 08/20/24  FUNCTIONAL GAIT ASSESSMENT  Date: 06/29/24 (with use of SPC R UE) Score  GAIT LEVEL SURFACE Instructions: Walk at your normal speed from here to the next mark (6 m) [20 ft]. (1) Moderate impairment-Walks 6 m (20 ft), slow speed, abnormal gait pattern, evidence for imbalance, or deviates 25.4 - 38.1 cm (10 -15 in) outside of the 30.48-cm (12-in) walkway width. Requires more than 7 seconds to ambulate 6 m (20 ft).11.47 seconds with SPC  2.   CHANGE IN GAIT SPEED Instructions: Begin walking at your normal pace (for 1.5 m [5 ft]). When I tell you go, walk as fast as you can (for 1.5 m [5 ft]). When I tell you slow, walk as slowly as you can (for 1.5 m [5 ft]. (1) Moderate impairment - Makes only minor adjustments to walking speed, or accomplishes a change in speed with significant gait deviations, deviates 25.4 -38.1 cm (10 -15 in) outside the 30.48-cm (12-in) walkway width, or changes speed but loses balance but is able to recover and continue walking.  3.    GAIT WITH HORIZONTAL HEAD TURNS Instructions: Walk from here to the next mark 6 m (20 ft) away. Begin walking at your normal pace. Keep walking  straight; after 3 steps, turn your head to the right and keep walking straight while looking to the right. After 3 more steps, turn your head to the left and keep walking straight while looking left. Continue alternating looking right and left. (1) Moderate impairment - Performs head turns with moderate change in gait velocity, slows down, deviates 25.4 -38.1 cm (10 -15 in) outside 30.48-cm (12-in) walkway width but recovers, can continue to walk.  4.   GAIT WITH VERTICAL HEAD TURNS Instructions: Walk from here to the next mark (6 m [20 ft]). Begin walking at your normal pace. Keep walking straight; after 3 steps, tip your head up and keep walking straight while looking up. After 3 more steps, tip your head down, keep walking straight while looking down. Continue  alternating looking up and down every 3 steps until you have completed 2 repetitions in each direction. (2) Mild impairment - Performs task with slight change in gait velocity (eg, minor disruption to smooth gait path), deviates 15.24 -25.4 cm (6 -10 in) outside 30.48-cm (12-in) walkway width or uses assistive device.  5.  GAIT AND PIVOT TURN Instructions: Begin with walking at your normal pace. When I tell you, turn and stop, turn as quickly as you can to face the opposite direction and stop. (2) Mild impairment - Pivot turns safely in 3 seconds and stops with no loss of balance, or pivot turns safely within 3 seconds and stops with mild imbalance, requires small steps to catch balance  6.   STEP OVER OBSTACLE Instructions: Begin walking at your normal speed. When you come to the shoe box, step over it, not around it, and keep walking. (1) Moderate impairment -  Is able to step over one shoe box (11.43 cm [4.5 in] total height) but must slow down and adjust steps to clear box safely. May require verbal cueing.  7.   GAIT WITH NARROW BASE OF SUPPORT Instructions: Walk on the floor with arms folded across the chest, feet aligned heel to toe in  tandem for a distance of 3.6 m [12 ft]. The number of steps taken in a straight line are counted for a maximum of 10 steps. (0) Severe impairment - Ambulates less than 4 steps heel to toe or cannot perform without assistance.  8.   GAIT WITH EYES CLOSED Instructions: Walk at your normal speed from here to the next mark (6 m [20 ft]) with your eyes closed. (1) Moderate impairment - Walks 6 m (20 ft), slow speed, abnormal gait pattern, evidence for imbalance, deviates 25.4 -38.1 cm (10 -15 in) outside 30.48-cm (12-in) walkway width. Requires more than 9 seconds to ambulate 6 m (20 ft). 19.56 seconds with SPC  9.   AMBULATING BACKWARDS Instructions: Walk backwards until I tell you to stop (1) Moderate impairment - Walks 6 m (20 ft), slow speed, abnormal gait pattern, evidence for imbalance, deviates 25.4 -38.1 cm (10 -15 in) outside 30.48-cm (12-in) walkway width.26.63 seconds  10. STEPS Instructions: Walk up these stairs as you would at home (ie, using the rail if necessary). At the top turn around and walk down. (1) Moderate impairment-Two feet to a stair; must use rail.  Alternating pattern ascending with R UE support on railing: step to pattern with B UE support on railing  Total 11/30   Interpretation of scores: Non-Specific Older Adults Cutoff Score: <=22/30 = risk of falls Parkinsons Disease Cutoff score <15/30= fall risk (Hoehn & Yahr 1-4)  Minimally Clinically Important Difference (MCID)  Stroke (acute, subacute, and chronic) = MDC: 4.2 points Vestibular (acute) = MDC: 6 points Community Dwelling Older Adults =  MCID: 4 points Parkinsons Disease  =  MDC: 4.3 points  (Academy of Neurologic Physical Therapy (nd). Functional Gait Assessment. Retrieved from https://www.neuropt.org/docs/default-source/cpgs/core-outcome-measures/function-gait-assessment-pocket-guide-proof9-(2).pdf?sfvrsn=b55f35043_0.)                                                                                                                           TREATMENT DATE: 08/20/24  Self Care: BP and HR taken in sitting at rest beginning of session (see below for details). Vitals:   08/20/24 0935  BP: (!) 143/79  Pulse: 82    Therapeutic Activity: 5 time sit to stand: 14.16 seconds (no UE support) FGA: 15/30 (no SPC use); see below for detailed report FUNCTIONAL GAIT ASSESSMENT  Date: 08/20/24 Score  GAIT LEVEL SURFACE Instructions: Walk at your normal speed from here to the next mark (6 m) [20 ft]. (1) Moderate impairment-Walks 6 m (20 ft), slow speed, abnormal gait pattern, evidence for imbalance, or deviates 25.4 - 38.1 cm (10 -15 in) outside of the 30.48-cm (12-in) walkway width. Requires more than 7 seconds to ambulate 6  m (20 ft).  9.50 seconds  2.   CHANGE IN GAIT SPEED Instructions: Begin walking at your normal pace (for 1.5 m [5 ft]). When I tell you go, walk as fast as you can (for 1.5 m [5 ft]). When I tell you slow, walk as slowly as you can (for 1.5 m [5 ft]. (2) Mild impairment - Is able to change speed but demonstrates mild gait deviations, deviates 15.24 -25.4 cm (6 -10 in) outside of the 30.48-cm (12-in) walkway width, or no gait deviations but unable to achieve a significant change in velocity, or uses an assistive device  3.    GAIT WITH HORIZONTAL HEAD TURNS Instructions: Walk from here to the next mark 6 m (20 ft) away. Begin walking at your normal pace. Keep walking straight; after 3 steps, turn your head to the right and keep walking straight while looking to the right. After 3 more steps, turn your head to the left and keep walking straight while looking left. Continue alternating looking right and left. (3) Normal - Performs head turns smoothly with no change in gait. Deviates no more than 15.24 cm (6 in) outside 30.48-cm (12-in) walkway width.  4.   GAIT WITH VERTICAL HEAD TURNS Instructions: Walk from here to the next mark (6 m [20 ft]). Begin walking at your normal pace. Keep walking straight;  after 3 steps, tip your head up and keep walking straight while looking up. After 3 more steps, tip your head down, keep walking straight while looking down. Continue  alternating looking up and down every 3 steps until you have completed 2 repetitions in each direction. (3) Normal - Performs head turns with no change in gait. Deviates no more than 15.24 cm (6 in) outside 30.48-cm (12-in) walkway width.  5.  GAIT AND PIVOT TURN Instructions: Begin with walking at your normal pace. When I tell you, turn and stop, turn as quickly as you can to face the opposite direction and stop. (2) Mild impairment - Pivot turns safely in 3 seconds and stops with no loss of balance, or pivot turns safely within 3 seconds and stops with mild imbalance, requires small steps to catch balance  6.   STEP OVER OBSTACLE Instructions: Begin walking at your normal speed. When you come to the shoe box, step over it, not around it, and keep walking. (1) Moderate impairment - Is able to step over one shoe box (11.43 cm [4.5 in] total height) but must slow down and adjust steps to clear box safely. May require verbal cueing.  7.   GAIT WITH NARROW BASE OF SUPPORT Instructions: Walk on the floor with arms folded across the chest, feet aligned heel to toe in tandem for a distance of 3.6 m [12 ft]. The number of steps taken in a straight line are counted for a maximum of 10 steps. (0) Severe impairment - Ambulates less than 4 steps heel to toe or cannot perform without assistance.  8.   GAIT WITH EYES CLOSED Instructions: Walk at your normal speed from here to the next mark (6 m [20 ft]) with your eyes closed. (1) Moderate impairment - Walks 6 m (20 ft), slow speed, abnormal gait pattern, evidence for imbalance, deviates 25.4 -38.1 cm (10 -15 in) outside 30.48-cm (12-in) walkway width. Requires more than 9 seconds to ambulate 6 m (20 ft).  21.06 seconds  9.   AMBULATING BACKWARDS Instructions: Walk backwards until I tell you to stop (1)  Moderate impairment - Walks 6  m (20 ft), slow speed, abnormal gait pattern, evidence for imbalance, deviates 25.4 -38.1 cm (10 -15 in) outside 30.48-cm (12-in) walkway width.  34.47 seconds  10. STEPS Instructions: Walk up these stairs as you would at home (ie, using the rail if necessary). At the top turn around and walk down. (1) Moderate impairment-Two feet to a stair; must use rail.  Total 15/30   Interpretation of scores: Non-Specific Older Adults Cutoff Score: <=22/30 = risk of falls Parkinsons Disease Cutoff score <15/30= fall risk (Hoehn & Yahr 1-4)  Minimally Clinically Important Difference (MCID)  Stroke (acute, subacute, and chronic) = MDC: 4.2 points Vestibular (acute) = MDC: 6 points Community Dwelling Older Adults =  MCID: 4 points Parkinsons Disease  =  MDC: 4.3 points  (Academy of Neurologic Physical Therapy (nd). Functional Gait Assessment. Retrieved from https://www.neuropt.org/docs/default-source/cpgs/core-outcome-measures/function-gait-assessment-pocket-guide-proof9-(2).pdf?sfvrsn=b44f35043_0.)  Standing balance in // bars: 6 foot foam balance beam: Tandem walking x4 reps heel to toe Notes: intermittent UE support for balance; SBA; increased time to perform (pt cautious and concentrating on task) Side stepping (L then R = 1 rep) with forward toe taps to gum drops (4 gum drops total) with B LE's: x1 rep Notes: intermittent progressing to occasional UE support for balance; SBA; increased time to perform (pt cautious and concentrating on task)   PATIENT EDUCATION: Education details: Continue HEP.  LTG results.  POC. Person educated: Patient Education method: Explanation, Demonstration, Tactile cues, and Verbal cues Education comprehension: verbalized understanding, returned demonstration, verbal cues required, and needs further education  HOME EXERCISE PROGRAM: Access Code: [email or web address] URL: [email or web address] Date: 07/23/2024 Prepared by: Damien Caulk  Exercises - Seated March  - 1 x daily - 5 x weekly - 1-2 sets - 5-10 reps - Seated Long Arc Quad  - 1 x daily - 5 x weekly - 1-2 sets - 5-10 reps - Standing March with Counter Support  - 1 x daily - 5 x weekly - 1-2 sets - 10 reps - Standing Hip Abduction with Counter Support  - 1 x daily - 5 x weekly - 1-2 sets - 10 reps - Heel Raises with Counter Support  - 1 x daily - 5 x weekly - 1-2 sets - 10 reps - Supine Bridge  - 1 x daily - 5 x weekly - 1-2 sets - 10 reps - Clamshell  - 1 x daily - 5 x weekly - 1-2 sets - 5-10 reps  GOALS: Goals reviewed with patient? Yes  SHORT TERM GOALS: Target date: 07/23/2024  Pt will be independent with initial HEP in order to improve strength and balance in order to decrease fall risk and improve function at home for ADL's. Baseline: Issued 06/25/24 Goal status: MET  2.  Pt will decrease 5 Time Sit to Stand by at least 3 seconds in order to demonstrate clinically significant improvement in LE strength. Baseline: 24.22 seconds on Eval; 17.07 seconds 07/23/24 Goal status: MET  3.  Assess FGA and update LTG as needed. Baseline: 11/30 on 06/29/24 Goal status: MET   LONG TERM GOALS: Target date: 08/20/2024  Pt will be independent with final HEP in order to improve strength and balance in order to decrease fall risk and improve function at home for ADL's. Baseline: Issued Goal status: MET  2.  Pt will decrease 5 Time Sit to Stand by at least 3 seconds (from STG measurement) in order to demonstrate clinically significant improvement in LE strength. Baseline: 24.22 seconds on Eval; 17.07 seconds 07/23/24; 14.16 seconds (no UE  support) 08/20/24 Goal status: PROGRESSING  3.  Pt will increase by at least 0.13 m/s in order to demonstrate clinically significant improvement in community ambulation.  Baseline:  0.60 m/sec with SPC on Eval; 0.83 m/sec with [facility] 08/17/24 Goal status: MET  4.  Pt will demonstrate improved R LE foot clearance for  safety with gait. Baseline: decreased R LE foot clearance with fatigue on Eval Goal status: MET  5.  Patient will increase Functional Gait Assessment score to > or = 20/30 as to reduce fall risk and improve dynamic gait safety with community ambulation. Baseline: 11/30 on 06/29/24 with SPC use; 15/30 08/20/24 (no SPC use) Goal status: PROGRESSING   LONG TERM GOALS: Target date: 09/03/2024      1.  Pt will decrease 5 Time Sit to Stand by > or = 3 seconds (from STG measurement) in order to demonstrate clinically significant improvement in LE strength. Baseline: 24.22 seconds on Eval; 17.07 seconds 07/23/24 (STG measurement); 14.16 seconds (no UE support) 08/20/24 Goal status: INITIAL      2.  Patient will increase Functional Gait Assessment score to > or = 19/30 as to reduce fall risk and improve dynamic gait safety with community ambulation. Baseline: 11/30 on 06/29/24 with SPC use; 15/30 08/20/24 (no SPC use) Goal status: INITIAL   ASSESSMENT:  CLINICAL IMPRESSION: Patient was seen today for physical therapy treatment to address LTG's and balance.  Focused session on assessing LTG's (including 5 time sit to stand and FGA) and challenging balance on a compliant surface.  Pt scored 14.16 seconds on the 5 time sit to stand test indicating pt is not at risk for falls (>15 seconds = increased risk of falls); goal progressing.  Pt scored 15/30 on FGA indicating pt may be at increased risk of falls (</= 22/30 = risk of falls in elderly); goal progressing; of note pt utilized [facility] when initially tested on FGA and progressed to no AD use on FGA today.  Pt met 3/5 LTG's and progressing with 2/5 LTG's.  Pt has been seen for 11/17 visits.  Per discussion with pt (regarding POC), LTG's updated (target date in 2 weeks) with plan for 4 more treatment sessions.  They would continue to benefit from skilled PT to address impairments as noted and progress towards long term goals.  OBJECTIVE IMPAIRMENTS: Abnormal gait,  cardiopulmonary status limiting activity, decreased activity tolerance, decreased balance, decreased coordination, decreased endurance, decreased knowledge of condition, decreased knowledge of use of DME, decreased mobility, difficulty walking, decreased ROM, decreased strength, increased muscle spasms, impaired flexibility, impaired sensation, impaired tone, improper body mechanics, postural dysfunction, and pain.   ACTIVITY LIMITATIONS: carrying, lifting, bending, standing, squatting, stairs, transfers, bathing, and locomotion level  PARTICIPATION LIMITATIONS: cleaning, laundry, shopping, community activity, and occupation  PERSONAL FACTORS: Past/current experiences, Time since onset of injury/illness/exacerbation, and 1-2 comorbidities: MS and CHF are also affecting patient's functional outcome.   REHAB POTENTIAL: Good  CLINICAL DECISION MAKING: Evolving/moderate complexity  EVALUATION COMPLEXITY: Moderate  PLAN:  PT FREQUENCY: 2x/week  PT DURATION: 8 weeks  PLANNED INTERVENTIONS: [id number]- PT Re-evaluation, 97750- Physical Performance Testing, 97110-Therapeutic exercises, 97530- Therapeutic activity, [id number]- Neuromuscular re-education, 97535- Self Care, [id number]- Manual therapy, [id number]- Gait training, [phone]- Orthotic Initial, [phone]- Orthotic/Prosthetic subsequent, [phone]- Aquatic Therapy, [id number]- Electrical stimulation (manual), [id number]- Ultrasound, [phone] (1-2 muscles), [id number] (3+ muscles)- Dry Needling, Patient/Family education, Balance training, Stair training, Taping, Joint mobilization, Spinal mobilization, Compression bandaging, DME instructions, Cryotherapy, Moist heat, and Biofeedback  PLAN FOR NEXT SESSION: Check on  HEP.  Aerobic tolerance, strengthening, balance (with reaching), coordination, stretching, improving R LE foot clearance with gait.   Damien Caulk, PT 08/20/2024, 9:33 PM        "

## 2024-08-24 ENCOUNTER — Ambulatory Visit: Admitting: Physical Therapy

## 2024-08-27 ENCOUNTER — Ambulatory Visit: Admitting: Physical Therapy

## 2024-08-27 ENCOUNTER — Encounter: Payer: Self-pay | Admitting: Physical Therapy

## 2024-08-27 VITALS — BP 132/84 | HR 90

## 2024-08-27 DIAGNOSIS — R29898 Other symptoms and signs involving the musculoskeletal system: Secondary | ICD-10-CM

## 2024-08-27 DIAGNOSIS — R2689 Other abnormalities of gait and mobility: Secondary | ICD-10-CM | POA: Diagnosis not present

## 2024-08-27 DIAGNOSIS — M6281 Muscle weakness (generalized): Secondary | ICD-10-CM

## 2024-08-27 DIAGNOSIS — R29818 Other symptoms and signs involving the nervous system: Secondary | ICD-10-CM

## 2024-08-27 NOTE — Therapy (Signed)
 " OUTPATIENT PHYSICAL THERAPY NEURO TREATMENT   Patient Name: Amanda [name] MRN: [id number] DOB:1980/09/17, 44 y.o., female Today's Date: 08/27/2024   PCP: Alben Therisa MATSU, PA REFERRING PROVIDER: [name], MD  END OF SESSION:  PT End of Session - 08/27/24 0934     Visit Number 12    Number of Visits 17   16 visits plus Eval   Date for Recertification  09/03/24   for scheduling delays   Authorization Type [employer]    Authorization - Visit Number 12    Authorization - Number of Visits 30   30 (PT,OT)   PT Start Time 0933    PT Stop Time 1015    PT Time Calculation (min) 42 min    Equipment Utilized During Treatment Gait belt    Activity Tolerance No increased pain;Patient tolerated treatment well    Behavior During Therapy [facility] for tasks assessed/performed          Past Medical History:  Diagnosis Date   Anemia    BP (high blood pressure)    Chest pain    CHF (congestive heart failure) (HCC)    Edema, lower extremity    History of blood transfusion 08/2017   related to low blood count    Infertility, female    Joint pain    Multiple sclerosis    Pneumonia 12/2017   Shortness of breath    Swallowing difficulty    Vitamin D  deficiency    Past Surgical History:  Procedure Laterality Date   CARDIAC CATHETERIZATION  01/12/2018   Patient Active Problem List   Diagnosis Date Noted   Other iron  deficiency anemias 07/06/2024   Anemia 07/30/2022   Influenza A 07/30/2022   Essential hypertension 02/08/2020   Vitamin D  deficiency 02/08/2020   Depression 02/08/2020   History of anemia-  uterine fibroids 02/08/2020   History of uterine fibroid 02/08/2020   Chronic systolic CHF (congestive heart failure) (HCC) 02/08/2020   At risk for diabetes mellitus 02/08/2020   Acute systolic (congestive) heart failure (HCC) 01/09/2018   Idiopathic cardiomyopathy (HCC) 01/09/2018   Uterine fibroid 09/07/2017   Multiple sclerosis    Class 3 severe obesity with serious  comorbidity and body mass index (BMI) of 50.0 to 59.9 in adult [facility])     ONSET DATE: 06/14/2024  REFERRING DIAG: H64.A (ICD-10-CM) - Relapsing remitting multiple sclerosis R26.9 (ICD-10-CM) - Gait disturbance M62.838 (ICD-10-CM) - Muscle spasm of right leg  THERAPY DIAG:  Other abnormalities of gait and mobility  Muscle weakness (generalized)  Other symptoms and signs involving the nervous system  Other symptoms and signs involving the musculoskeletal system  Rationale for Evaluation and Treatment: Rehabilitation  SUBJECTIVE:  SUBJECTIVE STATEMENT: Pt reports missing last appt d/t family drama.  No recent falls reported.  HEP going good; no questions.  Pt reports feeling like her balance is off today (has h/o this). Pt accompanied by: self  PERTINENT HISTORY: Pt with relapsing remitting MS.  Worsening balance.  R leg weakness (R foot drop) and painful spasms R foot.  R flank down to R leg/foot numbness since Dec 2024 relapse.  L toes numb.  Blurry vision sometimes.  Wording finding difficulty new in past 6 months.  Taking Baclofen  TID (often sleepy).  PMH includes MS, chronic CHF, anemia, high BP, chest pain, LE edema, joint pain, PNA, SOB, swallowing difficulty, Vitamin D  deficiency, cardiac cath.  PAIN:  Are you having pain? 7/10 R LE pain (burning sensation; typical symptom for pt)  PRECAUTIONS: Fall  RED FLAGS: None   WEIGHT BEARING RESTRICTIONS: No  FALLS: Has patient fallen in last 6 months? No  LIVING ENVIRONMENT: Lives with: lives with their family (husband) Lives in: House/apartment 1 level house Stairs: level entry Has following equipment at home: Single point cane  PLOF: Modified independent ambulating with SPC in community; uses buggies at stores; no AD use in home  PATIENT  GOALS: Get my balance back and get stability in feet and legs.  OBJECTIVE:  Note: Objective measures were completed at Evaluation unless otherwise noted.  DIAGNOSTIC FINDINGS:  Per MRI Brain 02/23/24: Multiple T2/FLAIR hyperintense foci in the cerebral hemispheres, left thalamus, brainstem and cerebellum in a pattern consistent with demyelinating plaque associated with multiple sclerosis. Most of the foci appear to be chronic though 1 small focus in the left frontal lobe enhances after contrast consistent with acute demyelination. Compared to the MRI from 2016, there has been significant progression.  COGNITION: Overall cognitive status: Within functional limits for tasks assessed   SENSATION: Tingling/numbness R lower leg and foot; decreased light touch R lateral lower leg and foot  COORDINATION: Decreased quality R LE alternating rapid toe taps compared to L LE in sitting Unable to perform R LE heel to shin coordination d/t weakness in sitting (L LE appearing WFL)  EDEMA:  None noted  MUSCLE TONE: RLE: Mild and Hypertonic; no clonus noted R ankle  POSTURE: rounded shoulders and forward head  LOWER EXTREMITY ROM:     Active  Right Eval Left Eval  Hip flexion WFL WNL  Hip extension    Hip abduction    Hip adduction    Hip internal rotation    Hip external rotation    Knee flexion WNL WNL  Knee extension WNL WNL  Ankle dorsiflexion WNL WNL  Ankle plantarflexion WNL WNL  Ankle inversion    Ankle eversion     (Blank rows = not tested)  LOWER EXTREMITY MMT:    MMT Right Eval Left Eval  Hip flexion 3+/5 4+/5  Hip extension    Hip abduction    Hip adduction    Hip internal rotation    Hip external rotation    Knee flexion 4/5 5/5  Knee extension 4-/5 5/5  Ankle dorsiflexion 4/5 4+/5  Ankle plantarflexion At least 3/5 AROM At least 3/5 AROM  Ankle inversion    Ankle eversion    (Blank rows = not tested)  BED MOBILITY:  Pt reports no  issues  TRANSFERS: Sit to stand: Modified independence  Assistive device utilized: None     Stand to sit: Modified independence  Assistive device utilized: None      GAIT: Findings: Gait Characteristics: decreased  R LE heel-strike and foot clearance, Distance walked: clinic distances, Assistive device utilized:Single point cane, Level of assistance: SBA, and Comments: decreased R LE foot clearance and decreased gait speed with fatigue; R LE spasm post ambulation on  FUNCTIONAL TESTS:  5 times sit to stand: 24.22 seconds no UE support (increased effort and time to stand last 2 trials); 17.07 seconds (no UE support) 07/23/24; 14.16 seconds (no UE support) 08/20/24 10 meter walk test: 0.60 m/sec with SPC R UE (16.53 seconds); 0.83 m/sec with SPC R UE 08/17/24 FGA: 11/30 on 06/29/24; 15/30 08/20/24  FUNCTIONAL GAIT ASSESSMENT  Date: 06/29/24 (with use of SPC R UE) Score  GAIT LEVEL SURFACE Instructions: Walk at your normal speed from here to the next mark (6 m) [20 ft]. (1) Moderate impairment-Walks 6 m (20 ft), slow speed, abnormal gait pattern, evidence for imbalance, or deviates 25.4 - 38.1 cm (10 -15 in) outside of the 30.48-cm (12-in) walkway width. Requires more than 7 seconds to ambulate 6 m (20 ft).11.47 seconds with SPC  2.   CHANGE IN GAIT SPEED Instructions: Begin walking at your normal pace (for 1.5 m [5 ft]). When I tell you go, walk as fast as you can (for 1.5 m [5 ft]). When I tell you slow, walk as slowly as you can (for 1.5 m [5 ft]. (1) Moderate impairment - Makes only minor adjustments to walking speed, or accomplishes a change in speed with significant gait deviations, deviates 25.4 -38.1 cm (10 -15 in) outside the 30.48-cm (12-in) walkway width, or changes speed but loses balance but is able to recover and continue walking.  3.    GAIT WITH HORIZONTAL HEAD TURNS Instructions: Walk from here to the next mark 6 m (20 ft) away. Begin walking at your normal pace. Keep walking  straight; after 3 steps, turn your head to the right and keep walking straight while looking to the right. After 3 more steps, turn your head to the left and keep walking straight while looking left. Continue alternating looking right and left. (1) Moderate impairment - Performs head turns with moderate change in gait velocity, slows down, deviates 25.4 -38.1 cm (10 -15 in) outside 30.48-cm (12-in) walkway width but recovers, can continue to walk.  4.   GAIT WITH VERTICAL HEAD TURNS Instructions: Walk from here to the next mark (6 m [20 ft]). Begin walking at your normal pace. Keep walking straight; after 3 steps, tip your head up and keep walking straight while looking up. After 3 more steps, tip your head down, keep walking straight while looking down. Continue  alternating looking up and down every 3 steps until you have completed 2 repetitions in each direction. (2) Mild impairment - Performs task with slight change in gait velocity (eg, minor disruption to smooth gait path), deviates 15.24 -25.4 cm (6 -10 in) outside 30.48-cm (12-in) walkway width or uses assistive device.  5.  GAIT AND PIVOT TURN Instructions: Begin with walking at your normal pace. When I tell you, turn and stop, turn as quickly as you can to face the opposite direction and stop. (2) Mild impairment - Pivot turns safely in 3 seconds and stops with no loss of balance, or pivot turns safely within 3 seconds and stops with mild imbalance, requires small steps to catch balance  6.   STEP OVER OBSTACLE Instructions: Begin walking at your normal speed. When you come to the shoe box, step over it, not around it, and keep walking. (1) Moderate impairment -  Is able to step over one shoe box (11.43 cm [4.5 in] total height) but must slow down and adjust steps to clear box safely. May require verbal cueing.  7.   GAIT WITH NARROW BASE OF SUPPORT Instructions: Walk on the floor with arms folded across the chest, feet aligned heel to toe in  tandem for a distance of 3.6 m [12 ft]. The number of steps taken in a straight line are counted for a maximum of 10 steps. (0) Severe impairment - Ambulates less than 4 steps heel to toe or cannot perform without assistance.  8.   GAIT WITH EYES CLOSED Instructions: Walk at your normal speed from here to the next mark (6 m [20 ft]) with your eyes closed. (1) Moderate impairment - Walks 6 m (20 ft), slow speed, abnormal gait pattern, evidence for imbalance, deviates 25.4 -38.1 cm (10 -15 in) outside 30.48-cm (12-in) walkway width. Requires more than 9 seconds to ambulate 6 m (20 ft). 19.56 seconds with SPC  9.   AMBULATING BACKWARDS Instructions: Walk backwards until I tell you to stop (1) Moderate impairment - Walks 6 m (20 ft), slow speed, abnormal gait pattern, evidence for imbalance, deviates 25.4 -38.1 cm (10 -15 in) outside 30.48-cm (12-in) walkway width.26.63 seconds  10. STEPS Instructions: Walk up these stairs as you would at home (ie, using the rail if necessary). At the top turn around and walk down. (1) Moderate impairment-Two feet to a stair; must use rail.  Alternating pattern ascending with R UE support on railing: step to pattern with B UE support on railing  Total 11/30   Interpretation of scores: Non-Specific Older Adults Cutoff Score: <=22/30 = risk of falls Parkinsons Disease Cutoff score <15/30= fall risk (Hoehn & Yahr 1-4)  Minimally Clinically Important Difference (MCID)  Stroke (acute, subacute, and chronic) = MDC: 4.2 points Vestibular (acute) = MDC: 6 points Community Dwelling Older Adults =  MCID: 4 points Parkinsons Disease  =  MDC: 4.3 points  (Academy of Neurologic Physical Therapy (nd). Functional Gait Assessment. Retrieved from https://www.neuropt.org/docs/default-source/cpgs/core-outcome-measures/function-gait-assessment-pocket-guide-proof9-(2).pdf?sfvrsn=b93f35043_0.)                                                                                                                           TREATMENT DATE: 08/27/24  Self Care: BP and HR taken in sitting at rest beginning of session (see below for details). Vitals:   08/27/24 0936  BP: 132/84  Pulse: 90    Therapeutic Exercise: SciFit multi-peaks up to level 4 for 8 minutes using BUE/BLEs for neural priming for reciprocal movement, global strengthening, dynamic cardiovascular warmup and increased amplitude of stepping. RPE of 5/10 following activity.  Average stride length 11.3 inches.   Therapeutic Activity: Standing balance in // bars: 6 foot foam balance beam: Tandem walking (heel to toe) x6 trials Notes: pt appearing cautious and requiring increased time to perform; intermittent UE support for balance required Sidestepping (L then R x6 feet each direction = 1 trial) with  forward toe taps to gum drops (4 gum drops total) with B LE's: x2 trials Notes: pt requiring increased time (appearing cautious); intermittent UE support for balance required Sit to stands from mat table (2 inch step under L LE to improve R LE WB'ing): x5 reps x2 sets (no UE support)  PATIENT EDUCATION: Education details: Continue HEP.  2 more scheduled PT visits (next week). Person educated: Patient Education method: Explanation, Demonstration, Tactile cues, and Verbal cues Education comprehension: verbalized understanding, returned demonstration, verbal cues required, and needs further education  HOME EXERCISE PROGRAM: Access Code: [email or web address] URL: [email or web address] Date: 07/23/2024 Prepared by: Damien Caulk  Exercises - Seated March  - 1 x daily - 5 x weekly - 1-2 sets - 5-10 reps - Seated Long Arc Quad  - 1 x daily - 5 x weekly - 1-2 sets - 5-10 reps - Standing March with Counter Support  - 1 x daily - 5 x weekly - 1-2 sets - 10 reps - Standing Hip Abduction with Counter Support  - 1 x daily - 5 x weekly - 1-2 sets - 10 reps - Heel Raises with Counter Support  - 1 x daily - 5 x weekly - 1-2 sets -  10 reps - Supine Bridge  - 1 x daily - 5 x weekly - 1-2 sets - 10 reps - Clamshell  - 1 x daily - 5 x weekly - 1-2 sets - 5-10 reps  GOALS: Goals reviewed with patient? Yes  SHORT TERM GOALS: Target date: 07/23/2024  Pt will be independent with initial HEP in order to improve strength and balance in order to decrease fall risk and improve function at home for ADL's. Baseline: Issued 06/25/24 Goal status: MET  2.  Pt will decrease 5 Time Sit to Stand by at least 3 seconds in order to demonstrate clinically significant improvement in LE strength. Baseline: 24.22 seconds on Eval; 17.07 seconds 07/23/24 Goal status: MET  3.  Assess FGA and update LTG as needed. Baseline: 11/30 on 06/29/24 Goal status: MET   LONG TERM GOALS: Target date: 08/20/2024  Pt will be independent with final HEP in order to improve strength and balance in order to decrease fall risk and improve function at home for ADL's. Baseline: Issued Goal status: MET  2.  Pt will decrease 5 Time Sit to Stand by at least 3 seconds (from STG measurement) in order to demonstrate clinically significant improvement in LE strength. Baseline: 24.22 seconds on Eval; 17.07 seconds 07/23/24; 14.16 seconds (no UE support) 08/20/24 Goal status: PROGRESSING  3.  Pt will increase by at least 0.13 m/s in order to demonstrate clinically significant improvement in community ambulation.  Baseline:  0.60 m/sec with SPC on Eval; 0.83 m/sec with [facility] 08/17/24 Goal status: MET  4.  Pt will demonstrate improved R LE foot clearance for safety with gait. Baseline: decreased R LE foot clearance with fatigue on Eval Goal status: MET  5.  Patient will increase Functional Gait Assessment score to > or = 20/30 as to reduce fall risk and improve dynamic gait safety with community ambulation. Baseline: 11/30 on 06/29/24 with SPC use; 15/30 08/20/24 (no SPC use) Goal status: PROGRESSING   LONG TERM GOALS: Target date: 09/03/2024      1.  Pt will  decrease 5 Time Sit to Stand by > or = 3 seconds (from STG measurement) in order to demonstrate clinically significant improvement in LE strength. Baseline: 24.22 seconds on Eval; 17.07 seconds 07/23/24 (STG measurement); 14.16  seconds (no UE support) 08/20/24 Goal status: INITIAL      2.  Patient will increase Functional Gait Assessment score to > or = 19/30 as to reduce fall risk and improve dynamic gait safety with community ambulation. Baseline: 11/30 on 06/29/24 with SPC use; 15/30 08/20/24 (no SPC use) Goal status: INITIAL   ASSESSMENT:  CLINICAL IMPRESSION: Patient was seen today for physical therapy treatment to address strength, transfers, and balance.  Focused session on global strengthening, balance on compliant surface, and increasing R LE WB'ing with transfers.  Patient continues to be limited by strength and balance (pt reporting balance more off today; pt has h/o variable balance depending on the day).  They demonstrate improvement in R LE WB'ing with transfer activity (using 2 inch step under L LE to increase use/initiation of R LE).  They would continue to benefit from skilled PT to address impairments as noted and progress towards long term goals.  OBJECTIVE IMPAIRMENTS: Abnormal gait, cardiopulmonary status limiting activity, decreased activity tolerance, decreased balance, decreased coordination, decreased endurance, decreased knowledge of condition, decreased knowledge of use of DME, decreased mobility, difficulty walking, decreased ROM, decreased strength, increased muscle spasms, impaired flexibility, impaired sensation, impaired tone, improper body mechanics, postural dysfunction, and pain.   ACTIVITY LIMITATIONS: carrying, lifting, bending, standing, squatting, stairs, transfers, bathing, and locomotion level  PARTICIPATION LIMITATIONS: cleaning, laundry, shopping, community activity, and occupation  PERSONAL FACTORS: Past/current experiences, Time since onset of  injury/illness/exacerbation, and 1-2 comorbidities: MS and CHF are also affecting patient's functional outcome.   REHAB POTENTIAL: Good  CLINICAL DECISION MAKING: Evolving/moderate complexity  EVALUATION COMPLEXITY: Moderate  PLAN:  PT FREQUENCY: 2x/week  PT DURATION: 8 weeks  PLANNED INTERVENTIONS: [id number]- PT Re-evaluation, 97750- Physical Performance Testing, 97110-Therapeutic exercises, 97530- Therapeutic activity, [id number]- Neuromuscular re-education, 97535- Self Care, [id number]- Manual therapy, [id number]- Gait training, [phone]- Orthotic Initial, [phone]- Orthotic/Prosthetic subsequent, [phone]- Aquatic Therapy, [phone]- Electrical stimulation (manual), [id number]- Ultrasound, [id number] (1-2 muscles), [id number] (3+ muscles)- Dry Needling, Patient/Family education, Balance training, Stair training, Taping, Joint mobilization, Spinal mobilization, Compression bandaging, DME instructions, Cryotherapy, Moist heat, and Biofeedback  PLAN FOR NEXT SESSION: Check on HEP.  Aerobic tolerance, strengthening, balance (with reaching), coordination, stretching, improving R LE foot clearance with gait; improving R LE WB'ing with transfers.   Damien Caulk, PT 08/27/2024, 1:00 PM        "

## 2024-08-31 ENCOUNTER — Encounter: Payer: Self-pay | Admitting: Physical Therapy

## 2024-08-31 ENCOUNTER — Ambulatory Visit: Admitting: Physical Therapy

## 2024-08-31 VITALS — BP 131/77 | HR 76

## 2024-08-31 DIAGNOSIS — R29898 Other symptoms and signs involving the musculoskeletal system: Secondary | ICD-10-CM

## 2024-08-31 DIAGNOSIS — R2689 Other abnormalities of gait and mobility: Secondary | ICD-10-CM | POA: Diagnosis not present

## 2024-08-31 DIAGNOSIS — R29818 Other symptoms and signs involving the nervous system: Secondary | ICD-10-CM

## 2024-08-31 DIAGNOSIS — M6281 Muscle weakness (generalized): Secondary | ICD-10-CM

## 2024-08-31 NOTE — Therapy (Signed)
 " OUTPATIENT PHYSICAL THERAPY NEURO TREATMENT   Patient Name: Amanda [name] MRN: [id number] DOB:21-Oct-1980, 44 y.o., female Today's Date: 08/31/2024   PCP: Alben Therisa MATSU, [place] REFERRING PROVIDER: [name], MD  END OF SESSION:  PT End of Session - 08/31/24 1406     Visit Number 13    Number of Visits 17   16 visits plus Eval   Date for Recertification  09/03/24   for scheduling delays   Authorization Type [employer]    Authorization - Visit Number 13    Authorization - Number of Visits 30   30 (PT,OT)   PT Start Time 1404    PT Stop Time 1445    PT Time Calculation (min) 41 min    Equipment Utilized During Treatment Gait belt    Activity Tolerance No increased pain;Patient tolerated treatment well    Behavior During Therapy [facility] for tasks assessed/performed          Past Medical History:  Diagnosis Date   Anemia    BP (high blood pressure)    Chest pain    CHF (congestive heart failure) (HCC)    Edema, lower extremity    History of blood transfusion 08/2017   related to low blood count    Infertility, female    Joint pain    Multiple sclerosis    Pneumonia 12/2017   Shortness of breath    Swallowing difficulty    Vitamin D  deficiency    Past Surgical History:  Procedure Laterality Date   CARDIAC CATHETERIZATION  01/12/2018   Patient Active Problem List   Diagnosis Date Noted   Other iron  deficiency anemias 07/06/2024   Anemia 07/30/2022   Influenza A 07/30/2022   Essential hypertension 02/08/2020   Vitamin D  deficiency 02/08/2020   Depression 02/08/2020   History of anemia-  uterine fibroids 02/08/2020   History of uterine fibroid 02/08/2020   Chronic systolic CHF (congestive heart failure) (HCC) 02/08/2020   At risk for diabetes mellitus 02/08/2020   Acute systolic (congestive) heart failure (HCC) 01/09/2018   Idiopathic cardiomyopathy (HCC) 01/09/2018   Uterine fibroid 09/07/2017   Multiple sclerosis    Class 3 severe obesity with serious  comorbidity and body mass index (BMI) of 50.0 to 59.9 in adult [facility])     ONSET DATE: 06/14/2024  REFERRING DIAG: H64.A (ICD-10-CM) - Relapsing remitting multiple sclerosis R26.9 (ICD-10-CM) - Gait disturbance M62.838 (ICD-10-CM) - Muscle spasm of right leg  THERAPY DIAG:  Other abnormalities of gait and mobility  Muscle weakness (generalized)  Other symptoms and signs involving the nervous system  Other symptoms and signs involving the musculoskeletal system  Rationale for Evaluation and Treatment: Rehabilitation  SUBJECTIVE:  SUBJECTIVE STATEMENT: No recent falls.  Arrived with husband Norleen.  No acute changes reported.  Pt then reports on/off dropping things with R UE (has h/o this at times); thinks it is because MS medication is wearing off (is not a medication she takes daily). Pt accompanied by: self  PERTINENT HISTORY: Pt with relapsing remitting MS.  Worsening balance.  R leg weakness (R foot drop) and painful spasms R foot.  R flank down to R leg/foot numbness since Dec 2024 relapse.  L toes numb.  Blurry vision sometimes.  Wording finding difficulty new in past 6 months.  Taking Baclofen  TID (often sleepy).  PMH includes MS, chronic CHF, anemia, high BP, chest pain, LE edema, joint pain, PNA, SOB, swallowing difficulty, Vitamin D  deficiency, cardiac cath.  PAIN:  Are you having pain? 11/14/08 R LE pain (burning sensation; typical symptom for pt)  PRECAUTIONS: Fall  RED FLAGS: None   WEIGHT BEARING RESTRICTIONS: No  FALLS: Has patient fallen in last 6 months? No  LIVING ENVIRONMENT: Lives with: lives with their family (husband) Lives in: House/apartment 1 level house Stairs: level entry Has following equipment at home: Single point cane  PLOF: Modified independent ambulating with SPC  in community; uses buggies at stores; no AD use in home  PATIENT GOALS: Get my balance back and get stability in feet and legs.  OBJECTIVE:  Note: Objective measures were completed at Evaluation unless otherwise noted.  DIAGNOSTIC FINDINGS:  Per MRI Brain 02/23/24: Multiple T2/FLAIR hyperintense foci in the cerebral hemispheres, left thalamus, brainstem and cerebellum in a pattern consistent with demyelinating plaque associated with multiple sclerosis. Most of the foci appear to be chronic though 1 small focus in the left frontal lobe enhances after contrast consistent with acute demyelination. Compared to the MRI from 2016, there has been significant progression.  COGNITION: Overall cognitive status: Within functional limits for tasks assessed   SENSATION: Tingling/numbness R lower leg and foot; decreased light touch R lateral lower leg and foot  COORDINATION: Decreased quality R LE alternating rapid toe taps compared to L LE in sitting Unable to perform R LE heel to shin coordination d/t weakness in sitting (L LE appearing WFL)  EDEMA:  None noted  MUSCLE TONE: RLE: Mild and Hypertonic; no clonus noted R ankle  POSTURE: rounded shoulders and forward head  LOWER EXTREMITY ROM:     Active  Right Eval Left Eval  Hip flexion WFL WNL  Hip extension    Hip abduction    Hip adduction    Hip internal rotation    Hip external rotation    Knee flexion WNL WNL  Knee extension WNL WNL  Ankle dorsiflexion WNL WNL  Ankle plantarflexion WNL WNL  Ankle inversion    Ankle eversion     (Blank rows = not tested)  LOWER EXTREMITY MMT:    MMT Right Eval Left Eval  Hip flexion 3+/5 4+/5  Hip extension    Hip abduction    Hip adduction    Hip internal rotation    Hip external rotation    Knee flexion 4/5 5/5  Knee extension 4-/5 5/5  Ankle dorsiflexion 4/5 4+/5  Ankle plantarflexion At least 3/5 AROM At least 3/5 AROM  Ankle inversion    Ankle eversion    (Blank rows =  not tested)  BED MOBILITY:  Pt reports no issues  TRANSFERS: Sit to stand: Modified independence  Assistive device utilized: None     Stand to sit: Modified independence  Assistive device  utilized: None      GAIT: Findings: Gait Characteristics: decreased R LE heel-strike and foot clearance, Distance walked: clinic distances, Assistive device utilized:Single point cane, Level of assistance: SBA, and Comments: decreased R LE foot clearance and decreased gait speed with fatigue; R LE spasm post ambulation on  FUNCTIONAL TESTS:  5 times sit to stand: 24.22 seconds no UE support (increased effort and time to stand last 2 trials); 17.07 seconds (no UE support) 07/23/24; 14.16 seconds (no UE support) 08/20/24 10 meter walk test: 0.60 m/sec with SPC R UE (16.53 seconds); 0.83 m/sec with SPC R UE 08/17/24 FGA: 11/30 on 06/29/24; 15/30 08/20/24  FUNCTIONAL GAIT ASSESSMENT  Date: 06/29/24 (with use of SPC R UE) Score  GAIT LEVEL SURFACE Instructions: Walk at your normal speed from here to the next mark (6 m) [20 ft]. (1) Moderate impairment-Walks 6 m (20 ft), slow speed, abnormal gait pattern, evidence for imbalance, or deviates 25.4 - 38.1 cm (10 -15 in) outside of the 30.48-cm (12-in) walkway width. Requires more than 7 seconds to ambulate 6 m (20 ft).11.47 seconds with SPC  2.   CHANGE IN GAIT SPEED Instructions: Begin walking at your normal pace (for 1.5 m [5 ft]). When I tell you go, walk as fast as you can (for 1.5 m [5 ft]). When I tell you slow, walk as slowly as you can (for 1.5 m [5 ft]. (1) Moderate impairment - Makes only minor adjustments to walking speed, or accomplishes a change in speed with significant gait deviations, deviates 25.4 -38.1 cm (10 -15 in) outside the 30.48-cm (12-in) walkway width, or changes speed but loses balance but is able to recover and continue walking.  3.    GAIT WITH HORIZONTAL HEAD TURNS Instructions: Walk from here to the next mark 6 m (20 ft) away. Begin  walking at your normal pace. Keep walking straight; after 3 steps, turn your head to the right and keep walking straight while looking to the right. After 3 more steps, turn your head to the left and keep walking straight while looking left. Continue alternating looking right and left. (1) Moderate impairment - Performs head turns with moderate change in gait velocity, slows down, deviates 25.4 -38.1 cm (10 -15 in) outside 30.48-cm (12-in) walkway width but recovers, can continue to walk.  4.   GAIT WITH VERTICAL HEAD TURNS Instructions: Walk from here to the next mark (6 m [20 ft]). Begin walking at your normal pace. Keep walking straight; after 3 steps, tip your head up and keep walking straight while looking up. After 3 more steps, tip your head down, keep walking straight while looking down. Continue  alternating looking up and down every 3 steps until you have completed 2 repetitions in each direction. (2) Mild impairment - Performs task with slight change in gait velocity (eg, minor disruption to smooth gait path), deviates 15.24 -25.4 cm (6 -10 in) outside 30.48-cm (12-in) walkway width or uses assistive device.  5.  GAIT AND PIVOT TURN Instructions: Begin with walking at your normal pace. When I tell you, turn and stop, turn as quickly as you can to face the opposite direction and stop. (2) Mild impairment - Pivot turns safely in 3 seconds and stops with no loss of balance, or pivot turns safely within 3 seconds and stops with mild imbalance, requires small steps to catch balance  6.   STEP OVER OBSTACLE Instructions: Begin walking at your normal speed. When you come to the shoe box,  step over it, not around it, and keep walking. (1) Moderate impairment - Is able to step over one shoe box (11.43 cm [4.5 in] total height) but must slow down and adjust steps to clear box safely. May require verbal cueing.  7.   GAIT WITH NARROW BASE OF SUPPORT Instructions: Walk on the floor with arms folded across  the chest, feet aligned heel to toe in tandem for a distance of 3.6 m [12 ft]. The number of steps taken in a straight line are counted for a maximum of 10 steps. (0) Severe impairment - Ambulates less than 4 steps heel to toe or cannot perform without assistance.  8.   GAIT WITH EYES CLOSED Instructions: Walk at your normal speed from here to the next mark (6 m [20 ft]) with your eyes closed. (1) Moderate impairment - Walks 6 m (20 ft), slow speed, abnormal gait pattern, evidence for imbalance, deviates 25.4 -38.1 cm (10 -15 in) outside 30.48-cm (12-in) walkway width. Requires more than 9 seconds to ambulate 6 m (20 ft). 19.56 seconds with SPC  9.   AMBULATING BACKWARDS Instructions: Walk backwards until I tell you to stop (1) Moderate impairment - Walks 6 m (20 ft), slow speed, abnormal gait pattern, evidence for imbalance, deviates 25.4 -38.1 cm (10 -15 in) outside 30.48-cm (12-in) walkway width.26.63 seconds  10. STEPS Instructions: Walk up these stairs as you would at home (ie, using the rail if necessary). At the top turn around and walk down. (1) Moderate impairment-Two feet to a stair; must use rail.  Alternating pattern ascending with R UE support on railing: step to pattern with B UE support on railing  Total 11/30   Interpretation of scores: Non-Specific Older Adults Cutoff Score: <=22/30 = risk of falls Parkinsons Disease Cutoff score <15/30= fall risk (Hoehn & Yahr 1-4)  Minimally Clinically Important Difference (MCID)  Stroke (acute, subacute, and chronic) = MDC: 4.2 points Vestibular (acute) = MDC: 6 points Community Dwelling Older Adults =  MCID: 4 points Parkinsons Disease  =  MDC: 4.3 points  (Academy of Neurologic Physical Therapy (nd). Functional Gait Assessment. Retrieved from https://www.neuropt.org/docs/default-source/cpgs/core-outcome-measures/function-gait-assessment-pocket-guide-proof9-(2).pdf?sfvrsn=b61f35043_0.)                                                                                                                           TREATMENT DATE: 08/31/24  Self Care: BP and HR taken in sitting at rest beginning of session (see below for details). Vitals:   08/31/24 1407  BP: 131/77  Pulse: 76   Therapeutic Exercise: SciFit multi-peaks up to level 5 for 8 minutes using BUE/BLEs for neural priming for reciprocal movement, global strengthening, dynamic cardiovascular warmup and increased amplitude of stepping. RPE of 7/10 following activity.  Average stride length 11.1 inches.   Therapeutic Activity:  Standing balance in // bars: 6 foot foam balance beam: Tandem walking (heel to toe) x6 trials Notes: increased time to perform (pt appearing cautious); intermittent UE support for balance required Sidestepping (  L then R x6 feet each direction = 1 trial) with forward toe taps to gum drops (4 gum drops total) with B LE's: x3 trials Notes: pt requiring increased time (appearing cautious); intermittent UE support for balance required   PATIENT EDUCATION: Education details: Continue HEP.  1 more scheduled PT visit (plan for graduation from therapy next session).  Follow up with neurologist regarding R UE concerns (pt thinks symptoms d/t medication wearing off). Person educated: Patient Education method: Explanation, Demonstration, Tactile cues, and Verbal cues Education comprehension: verbalized understanding, returned demonstration, verbal cues required, and needs further education  HOME EXERCISE PROGRAM: Access Code: [email or web address] URL: [email or web address] Date: 07/23/2024 Prepared by: Damien Caulk  Exercises - Seated March  - 1 x daily - 5 x weekly - 1-2 sets - 5-10 reps - Seated Long Arc Quad  - 1 x daily - 5 x weekly - 1-2 sets - 5-10 reps - Standing March with Counter Support  - 1 x daily - 5 x weekly - 1-2 sets - 10 reps - Standing Hip Abduction with Counter Support  - 1 x daily - 5 x weekly - 1-2 sets - 10 reps - Heel Raises with  Counter Support  - 1 x daily - 5 x weekly - 1-2 sets - 10 reps - Supine Bridge  - 1 x daily - 5 x weekly - 1-2 sets - 10 reps - Clamshell  - 1 x daily - 5 x weekly - 1-2 sets - 5-10 reps  GOALS: Goals reviewed with patient? Yes  SHORT TERM GOALS: Target date: 07/23/2024  Pt will be independent with initial HEP in order to improve strength and balance in order to decrease fall risk and improve function at home for ADL's. Baseline: Issued 06/25/24 Goal status: MET  2.  Pt will decrease 5 Time Sit to Stand by at least 3 seconds in order to demonstrate clinically significant improvement in LE strength. Baseline: 24.22 seconds on Eval; 17.07 seconds 07/23/24 Goal status: MET  3.  Assess FGA and update LTG as needed. Baseline: 11/30 on 06/29/24 Goal status: MET   LONG TERM GOALS: Target date: 08/20/2024  Pt will be independent with final HEP in order to improve strength and balance in order to decrease fall risk and improve function at home for ADL's. Baseline: Issued Goal status: MET  2.  Pt will decrease 5 Time Sit to Stand by at least 3 seconds (from STG measurement) in order to demonstrate clinically significant improvement in LE strength. Baseline: 24.22 seconds on Eval; 17.07 seconds 07/23/24; 14.16 seconds (no UE support) 08/20/24 Goal status: PROGRESSING  3.  Pt will increase by at least 0.13 m/s in order to demonstrate clinically significant improvement in community ambulation.  Baseline:  0.60 m/sec with SPC on Eval; 0.83 m/sec with [facility] 08/17/24 Goal status: MET  4.  Pt will demonstrate improved R LE foot clearance for safety with gait. Baseline: decreased R LE foot clearance with fatigue on Eval Goal status: MET  5.  Patient will increase Functional Gait Assessment score to > or = 20/30 as to reduce fall risk and improve dynamic gait safety with community ambulation. Baseline: 11/30 on 06/29/24 with SPC use; 15/30 08/20/24 (no SPC use) Goal status: PROGRESSING   LONG  TERM GOALS: Target date: 09/03/2024      1.  Pt will decrease 5 Time Sit to Stand by > or = 3 seconds (from STG measurement) in order to demonstrate clinically significant improvement in LE strength. Baseline: 24.22  seconds on Eval; 17.07 seconds 07/23/24 (STG measurement); 14.16 seconds (no UE support) 08/20/24 Goal status: INITIAL      2.  Patient will increase Functional Gait Assessment score to > or = 19/30 as to reduce fall risk and improve dynamic gait safety with community ambulation. Baseline: 11/30 on 06/29/24 with SPC use; 15/30 08/20/24 (no SPC use) Goal status: INITIAL   ASSESSMENT:  CLINICAL IMPRESSION: Patient was seen today for physical therapy treatment to address strength and balance.  Focused session on global strengthening and balance on compliant surface (pt requesting to focus on balance during session).  Patient continues to be limited by strength and balance.  They demonstrate improvement in activity tolerance in general.  They would continue to benefit from skilled PT to address impairments as noted and progress towards long term goals.  Pt reports feeling ready to graduate from therapy.  Per discussion with pt, plan for 1 more therapy visit (then graduate from OP PT).  OBJECTIVE IMPAIRMENTS: Abnormal gait, cardiopulmonary status limiting activity, decreased activity tolerance, decreased balance, decreased coordination, decreased endurance, decreased knowledge of condition, decreased knowledge of use of DME, decreased mobility, difficulty walking, decreased ROM, decreased strength, increased muscle spasms, impaired flexibility, impaired sensation, impaired tone, improper body mechanics, postural dysfunction, and pain.   ACTIVITY LIMITATIONS: carrying, lifting, bending, standing, squatting, stairs, transfers, bathing, and locomotion level  PARTICIPATION LIMITATIONS: cleaning, laundry, shopping, community activity, and occupation  PERSONAL FACTORS: Past/current experiences,  Time since onset of injury/illness/exacerbation, and 1-2 comorbidities: MS and CHF are also affecting patient's functional outcome.   REHAB POTENTIAL: Good  CLINICAL DECISION MAKING: Evolving/moderate complexity  EVALUATION COMPLEXITY: Moderate  PLAN:  PT FREQUENCY: 2x/week  PT DURATION: 8 weeks  PLANNED INTERVENTIONS: [id number]- PT Re-evaluation, 97750- Physical Performance Testing, 97110-Therapeutic exercises, 97530- Therapeutic activity, [id number]- Neuromuscular re-education, 97535- Self Care, [id number]- Manual therapy, [id number]- Gait training, [phone]- Orthotic Initial, [phone]- Orthotic/Prosthetic subsequent, [phone]- Aquatic Therapy, [phone]- Electrical stimulation (manual), [id number]- Ultrasound, [phone] (1-2 muscles), [id number] (3+ muscles)- Dry Needling, Patient/Family education, Balance training, Stair training, Taping, Joint mobilization, Spinal mobilization, Compression bandaging, DME instructions, Cryotherapy, Moist heat, and Biofeedback  PLAN FOR NEXT SESSION: LTG's due (last planned session; plan for graduation from therapy).  Check on HEP.  Aerobic tolerance, strengthening, balance (with reaching), coordination, stretching, improving R LE foot clearance with gait; improving R LE WB'ing with transfers.   Damien Caulk, PT 09/01/2024, 8:06 PM        "

## 2024-09-03 ENCOUNTER — Encounter: Payer: Self-pay | Admitting: Physical Therapy

## 2024-09-03 ENCOUNTER — Ambulatory Visit: Admitting: Physical Therapy

## 2024-09-03 VITALS — BP 140/82 | HR 97

## 2024-09-03 DIAGNOSIS — R29898 Other symptoms and signs involving the musculoskeletal system: Secondary | ICD-10-CM

## 2024-09-03 DIAGNOSIS — R2689 Other abnormalities of gait and mobility: Secondary | ICD-10-CM | POA: Diagnosis not present

## 2024-09-03 DIAGNOSIS — M6281 Muscle weakness (generalized): Secondary | ICD-10-CM

## 2024-09-03 DIAGNOSIS — R29818 Other symptoms and signs involving the nervous system: Secondary | ICD-10-CM

## 2024-09-03 NOTE — Therapy (Signed)
 " OUTPATIENT PHYSICAL THERAPY NEURO TREATMENT--DISCHARGE NOTE  PHYSICAL THERAPY DISCHARGE SUMMARY  Visits from Start of Care: 14  Current functional level related to goals / functional outcomes: Independent with ambulation within home (use of SPC as needed based on fluctuating symptoms); use of SPC outside of home.   Remaining deficits: R LE pain; higher level balance; strength.   Education / Equipment: HEP issued.   Patient agrees to discharge. Patient goals were mostly met. Patient is being discharged due to being pleased with the current functional level.   Patient Name: Amanda [name] MRN: [id number] DOB:03/15/1981, 44 y.o., female Today's Date: 09/03/2024   PCP: Amanda Therisa MATSU, PA REFERRING PROVIDER: [name], MD  END OF SESSION:  PT End of Session - 09/03/24 1019     Visit Number 14    Number of Visits 17   16 visits plus Eval   Date for Recertification  09/03/24   for scheduling delays   Authorization Type [employer]    Authorization - Visit Number 14    Authorization - Number of Visits 30   30 (PT,OT)   PT Start Time 1018    PT Stop Time 1100    PT Time Calculation (min) 42 min    Equipment Utilized During Treatment Gait belt    Activity Tolerance No increased pain;Patient tolerated treatment well    Behavior During Therapy [facility] for tasks assessed/performed          Past Medical History:  Diagnosis Date   Anemia    BP (high blood pressure)    Chest pain    CHF (congestive heart failure) (HCC)    Edema, lower extremity    History of blood transfusion 08/2017   related to low blood count    Infertility, female    Joint pain    Multiple sclerosis    Pneumonia 12/2017   Shortness of breath    Swallowing difficulty    Vitamin D  deficiency    Past Surgical History:  Procedure Laterality Date   CARDIAC CATHETERIZATION  01/12/2018   Patient Active Problem List   Diagnosis Date Noted   Other iron  deficiency anemias 07/06/2024   Anemia 07/30/2022    Influenza A 07/30/2022   Essential hypertension 02/08/2020   Vitamin D  deficiency 02/08/2020   Depression 02/08/2020   History of anemia-  uterine fibroids 02/08/2020   History of uterine fibroid 02/08/2020   Chronic systolic CHF (congestive heart failure) (HCC) 02/08/2020   At risk for diabetes mellitus 02/08/2020   Acute systolic (congestive) heart failure (HCC) 01/09/2018   Idiopathic cardiomyopathy (HCC) 01/09/2018   Uterine fibroid 09/07/2017   Multiple sclerosis    Class 3 severe obesity with serious comorbidity and body mass index (BMI) of 50.0 to 59.9 in adult [facility])     ONSET DATE: 06/14/2024  REFERRING DIAG: H64.A (ICD-10-CM) - Relapsing remitting multiple sclerosis R26.9 (ICD-10-CM) - Gait disturbance M62.838 (ICD-10-CM) - Muscle spasm of right leg  THERAPY DIAG:  Other abnormalities of gait and mobility  Muscle weakness (generalized)  Other symptoms and signs involving the nervous system  Other symptoms and signs involving the musculoskeletal system  Rationale for Evaluation and Treatment: Rehabilitation  SUBJECTIVE:  SUBJECTIVE STATEMENT: Pt arrived ambulating with SPC.  No recent falls.  Still dropping stuff with R hand off and on; has not called neurologist regarding this concern yet but states she will stop by next door (to neurologist) when she leaves.  Pt reports feeling pleased with current status and is ready for discharge/graduation from PT today. Pt accompanied by: self  PERTINENT HISTORY: Pt with relapsing remitting MS.  Worsening balance.  R leg weakness (R foot drop) and painful spasms R foot.  R flank down to R leg/foot numbness since Dec 2024 relapse.  L toes numb.  Blurry vision sometimes.  Wording finding difficulty new in past 6 months.  Taking Baclofen  TID (often  sleepy).  PMH includes MS, chronic CHF, anemia, high BP, chest pain, LE edema, joint pain, PNA, SOB, swallowing difficulty, Vitamin D  deficiency, cardiac cath.  PAIN:  Are you having pain? 5-6/10 R LE pain (burning sensation; typical symptom for pt)  PRECAUTIONS: Fall  RED FLAGS: None   WEIGHT BEARING RESTRICTIONS: No  FALLS: Has patient fallen in last 6 months? No  LIVING ENVIRONMENT: Lives with: lives with their family (husband) Lives in: House/apartment 1 level house Stairs: level entry Has following equipment at home: Single point cane  PLOF: Modified independent ambulating with SPC in community; uses buggies at stores; no AD use in home  PATIENT GOALS: Get my balance back and get stability in feet and legs.  OBJECTIVE:  Note: Objective measures were completed at Evaluation unless otherwise noted.  DIAGNOSTIC FINDINGS:  Per MRI Brain 02/23/24: Multiple T2/FLAIR hyperintense foci in the cerebral hemispheres, left thalamus, brainstem and cerebellum in a pattern consistent with demyelinating plaque associated with multiple sclerosis. Most of the foci appear to be chronic though 1 small focus in the left frontal lobe enhances after contrast consistent with acute demyelination. Compared to the MRI from 2016, there has been significant progression.  COGNITION: Overall cognitive status: Within functional limits for tasks assessed   SENSATION: Tingling/numbness R lower leg and foot; decreased light touch R lateral lower leg and foot  COORDINATION: Decreased quality R LE alternating rapid toe taps compared to L LE in sitting Unable to perform R LE heel to shin coordination d/t weakness in sitting (L LE appearing WFL)  EDEMA:  None noted  MUSCLE TONE: RLE: Mild and Hypertonic; no clonus noted R ankle  POSTURE: rounded shoulders and forward head  LOWER EXTREMITY ROM:     Active  Right Eval Left Eval  Hip flexion WFL WNL  Hip extension    Hip abduction    Hip  adduction    Hip internal rotation    Hip external rotation    Knee flexion WNL WNL  Knee extension WNL WNL  Ankle dorsiflexion WNL WNL  Ankle plantarflexion WNL WNL  Ankle inversion    Ankle eversion     (Blank rows = not tested)  LOWER EXTREMITY MMT:    MMT Right Eval Left Eval  Hip flexion 3+/5 4+/5  Hip extension    Hip abduction    Hip adduction    Hip internal rotation    Hip external rotation    Knee flexion 4/5 5/5  Knee extension 4-/5 5/5  Ankle dorsiflexion 4/5 4+/5  Ankle plantarflexion At least 3/5 AROM At least 3/5 AROM  Ankle inversion    Ankle eversion    (Blank rows = not tested)  BED MOBILITY:  Pt reports no issues  TRANSFERS: Sit to stand: Modified independence  Assistive device utilized: None  Stand to sit: Modified independence  Assistive device utilized: None      GAIT: Findings: Gait Characteristics: decreased R LE heel-strike and foot clearance, Distance walked: clinic distances, Assistive device utilized:Single point cane, Level of assistance: SBA, and Comments: decreased R LE foot clearance and decreased gait speed with fatigue; R LE spasm post ambulation on  FUNCTIONAL TESTS:  5 times sit to stand: 24.22 seconds no UE support (increased effort and time to stand last 2 trials); 17.07 seconds (no UE support) 07/23/24; 14.16 seconds (no UE support) 08/20/24; 13.75 seconds 09/03/24 10 meter walk test: 0.60 m/sec with SPC R UE (16.53 seconds); 0.83 m/sec with SPC R UE 08/17/24 FGA: 11/30 on 06/29/24; 15/30 08/20/24; 18/30 09/03/24  FUNCTIONAL GAIT ASSESSMENT  Date: 06/29/24 (with use of SPC R UE) Score  GAIT LEVEL SURFACE Instructions: Walk at your normal speed from here to the next mark (6 m) [20 ft]. (1) Moderate impairment-Walks 6 m (20 ft), slow speed, abnormal gait pattern, evidence for imbalance, or deviates 25.4 - 38.1 cm (10 -15 in) outside of the 30.48-cm (12-in) walkway width. Requires more than 7 seconds to ambulate 6 m (20 ft).11.47  seconds with SPC  2.   CHANGE IN GAIT SPEED Instructions: Begin walking at your normal pace (for 1.5 m [5 ft]). When I tell you go, walk as fast as you can (for 1.5 m [5 ft]). When I tell you slow, walk as slowly as you can (for 1.5 m [5 ft]. (1) Moderate impairment - Makes only minor adjustments to walking speed, or accomplishes a change in speed with significant gait deviations, deviates 25.4 -38.1 cm (10 -15 in) outside the 30.48-cm (12-in) walkway width, or changes speed but loses balance but is able to recover and continue walking.  3.    GAIT WITH HORIZONTAL HEAD TURNS Instructions: Walk from here to the next mark 6 m (20 ft) away. Begin walking at your normal pace. Keep walking straight; after 3 steps, turn your head to the right and keep walking straight while looking to the right. After 3 more steps, turn your head to the left and keep walking straight while looking left. Continue alternating looking right and left. (1) Moderate impairment - Performs head turns with moderate change in gait velocity, slows down, deviates 25.4 -38.1 cm (10 -15 in) outside 30.48-cm (12-in) walkway width but recovers, can continue to walk.  4.   GAIT WITH VERTICAL HEAD TURNS Instructions: Walk from here to the next mark (6 m [20 ft]). Begin walking at your normal pace. Keep walking straight; after 3 steps, tip your head up and keep walking straight while looking up. After 3 more steps, tip your head down, keep walking straight while looking down. Continue  alternating looking up and down every 3 steps until you have completed 2 repetitions in each direction. (2) Mild impairment - Performs task with slight change in gait velocity (eg, minor disruption to smooth gait path), deviates 15.24 -25.4 cm (6 -10 in) outside 30.48-cm (12-in) walkway width or uses assistive device.  5.  GAIT AND PIVOT TURN Instructions: Begin with walking at your normal pace. When I tell you, turn and stop, turn as quickly as you can to face  the opposite direction and stop. (2) Mild impairment - Pivot turns safely in 3 seconds and stops with no loss of balance, or pivot turns safely within 3 seconds and stops with mild imbalance, requires small steps to catch balance  6.   STEP OVER OBSTACLE  Instructions: Begin walking at your normal speed. When you come to the shoe box, step over it, not around it, and keep walking. (1) Moderate impairment - Is able to step over one shoe box (11.43 cm [4.5 in] total height) but must slow down and adjust steps to clear box safely. May require verbal cueing.  7.   GAIT WITH NARROW BASE OF SUPPORT Instructions: Walk on the floor with arms folded across the chest, feet aligned heel to toe in tandem for a distance of 3.6 m [12 ft]. The number of steps taken in a straight line are counted for a maximum of 10 steps. (0) Severe impairment - Ambulates less than 4 steps heel to toe or cannot perform without assistance.  8.   GAIT WITH EYES CLOSED Instructions: Walk at your normal speed from here to the next mark (6 m [20 ft]) with your eyes closed. (1) Moderate impairment - Walks 6 m (20 ft), slow speed, abnormal gait pattern, evidence for imbalance, deviates 25.4 -38.1 cm (10 -15 in) outside 30.48-cm (12-in) walkway width. Requires more than 9 seconds to ambulate 6 m (20 ft). 19.56 seconds with SPC  9.   AMBULATING BACKWARDS Instructions: Walk backwards until I tell you to stop (1) Moderate impairment - Walks 6 m (20 ft), slow speed, abnormal gait pattern, evidence for imbalance, deviates 25.4 -38.1 cm (10 -15 in) outside 30.48-cm (12-in) walkway width.26.63 seconds  10. STEPS Instructions: Walk up these stairs as you would at home (ie, using the rail if necessary). At the top turn around and walk down. (1) Moderate impairment-Two feet to a stair; must use rail.  Alternating pattern ascending with R UE support on railing: step to pattern with B UE support on railing  Total 11/30   Interpretation of  scores: Non-Specific Older Adults Cutoff Score: <=22/30 = risk of falls Parkinsons Disease Cutoff score <15/30= fall risk (Hoehn & Yahr 1-4)  Minimally Clinically Important Difference (MCID)  Stroke (acute, subacute, and chronic) = MDC: 4.2 points Vestibular (acute) = MDC: 6 points Community Dwelling Older Adults =  MCID: 4 points Parkinsons Disease  =  MDC: 4.3 points  (Academy of Neurologic Physical Therapy (nd). Functional Gait Assessment. Retrieved from https://www.neuropt.org/docs/default-source/cpgs/core-outcome-measures/function-gait-assessment-pocket-guide-proof9-(2).pdf?sfvrsn=b79f35043_0.)                                                                                                                          TREATMENT DATE: 09/03/24  Self Care: BP and HR taken in sitting at rest beginning of session (see below for details). Vitals:   09/03/24 1022  BP: (!) 140/82  Pulse: 97   Therapeutic Activity: 5 time sit to stand: 13.75 seconds no UE support FGA: 18/30 (see below for detailed report) FUNCTIONAL GAIT ASSESSMENT  Date: 09/03/24 (no SPC use) Score  GAIT LEVEL SURFACE Instructions: Walk at your normal speed from here to the next mark (6 m) [20 ft]. (1) Moderate impairment-Walks 6 m (20 ft), slow speed, abnormal gait pattern, evidence for  imbalance, or deviates 25.4 - 38.1 cm (10 -15 in) outside of the 30.48-cm (12-in) walkway width. Requires more than 7 seconds to ambulate 6 m (20 ft). 8.15 seconds (no use of SPC)  2.   CHANGE IN GAIT SPEED Instructions: Begin walking at your normal pace (for 1.5 m [5 ft]). When I tell you go, walk as fast as you can (for 1.5 m [5 ft]). When I tell you slow, walk as slowly as you can (for 1.5 m [5 ft]. (3) Normal - Able to smoothly change walking speed without loss of balance or gait deviation. Shows a significant difference in walking speeds between normal, fast, and slow speeds. Deviates no more than 15.24 cm (6 in) outside of the 30.48-cm  (12-in) walkway width.  3.    GAIT WITH HORIZONTAL HEAD TURNS Instructions: Walk from here to the next mark 6 m (20 ft) away. Begin walking at your normal pace. Keep walking straight; after 3 steps, turn your head to the right and keep walking straight while looking to the right. After 3 more steps, turn your head to the left and keep walking straight while looking left. Continue alternating looking right and left. (3) Normal - Performs head turns smoothly with no change in gait. Deviates no more than 15.24 cm (6 in) outside 30.48-cm (12-in) walkway width.  4.   GAIT WITH VERTICAL HEAD TURNS Instructions: Walk from here to the next mark (6 m [20 ft]). Begin walking at your normal pace. Keep walking straight; after 3 steps, tip your head up and keep walking straight while looking up. After 3 more steps, tip your head down, keep walking straight while looking down. Continue  alternating looking up and down every 3 steps until you have completed 2 repetitions in each direction. (3) Normal - Performs head turns with no change in gait. Deviates no more than 15.24 cm (6 in) outside 30.48-cm (12-in) walkway width.  5.  GAIT AND PIVOT TURN Instructions: Begin with walking at your normal pace. When I tell you, turn and stop, turn as quickly as you can to face the opposite direction and stop. (3) Normal - Pivot turns safely within 3 seconds and stops quickly with no loss of balance  6.   STEP OVER OBSTACLE Instructions: Begin walking at your normal speed. When you come to the shoe box, step over it, not around it, and keep walking. (1) Moderate impairment - Is able to step over one shoe box (11.43 cm [4.5 in] total height) but must slow down and adjust steps to clear box safely. May require verbal cueing.  7.   GAIT WITH NARROW BASE OF SUPPORT Instructions: Walk on the floor with arms folded across the chest, feet aligned heel to toe in tandem for a distance of 3.6 m [12 ft]. The number of steps taken in a  straight line are counted for a maximum of 10 steps. (0) Severe impairment - Ambulates less than 4 steps heel to toe or cannot perform without assistance.  Unable to perform without using SPC (able to do 10 steps with SPC use).  8.   GAIT WITH EYES CLOSED Instructions: Walk at your normal speed from here to the next mark (6 m [20 ft]) with your eyes closed. (1) Moderate impairment - Walks 6 m (20 ft), slow speed, abnormal gait pattern, evidence for imbalance, deviates 25.4 -38.1 cm (10 -15 in) outside 30.48-cm (12-in) walkway width. Requires more than 9 seconds to ambulate 6 m (20 ft).  11.72 seconds no AD use  9.   AMBULATING BACKWARDS Instructions: Walk backwards until I tell you to stop (2) Mild impairment - Walks 6 m (20 ft), uses assistive device, slower speed, mild gait deviations, deviates 15.24 -25.4 cm (6 -10 in) outside 30.48-cm (12-in) walkway width; 18.29 seconds  10. STEPS Instructions: Walk up these stairs as you would at home (ie, using the rail if necessary). At the top turn around and walk down. (1) Moderate impairment-Two feet to a stair; must use rail.  Total 18/30   Interpretation of scores: Non-Specific Older Adults Cutoff Score: <=22/30 = risk of falls Parkinsons Disease Cutoff score <15/30= fall risk (Hoehn & Yahr 1-4)  Minimally Clinically Important Difference (MCID)  Stroke (acute, subacute, and chronic) = MDC: 4.2 points Vestibular (acute) = MDC: 6 points Community Dwelling Older Adults =  MCID: 4 points Parkinsons Disease  =  MDC: 4.3 points  (Academy of Neurologic Physical Therapy (nd). Functional Gait Assessment. Retrieved from https://www.neuropt.org/docs/default-source/cpgs/core-outcome-measures/function-gait-assessment-pocket-guide-proof9-(2).pdf?sfvrsn=b39f35043_0.)  Therapeutic exercise: Reviewed HEP Bridging: x10 reps supine (reviewed technique per pt request; pt independent after initial cueing for even pelvic position (was rotating up with L side  initially) and only going through ROM that feels comfortable)   PATIENT EDUCATION: Education details: Continue HEP.  Graduate/discharge from PT today.  Follow up with neurologist regarding R UE concerns (pt thinks symptoms d/t medication wearing off). Person educated: Patient Education method: Explanation, Demonstration, Tactile cues, and Verbal cues Education comprehension: verbalized understanding, returned demonstration, verbal cues required, and needs further education  HOME EXERCISE PROGRAM: Access Code: [email or web address] URL: [email or web address] Date: 07/23/2024 Prepared by: Damien Caulk  Exercises - Seated March  - 1 x daily - 5 x weekly - 1-2 sets - 5-10 reps - Seated Long Arc Quad  - 1 x daily - 5 x weekly - 1-2 sets - 5-10 reps - Standing March with Counter Support  - 1 x daily - 5 x weekly - 1-2 sets - 10 reps - Standing Hip Abduction with Counter Support  - 1 x daily - 5 x weekly - 1-2 sets - 10 reps - Heel Raises with Counter Support  - 1 x daily - 5 x weekly - 1-2 sets - 10 reps - Supine Bridge  - 1 x daily - 5 x weekly - 1-2 sets - 10 reps - Clamshell  - 1 x daily - 5 x weekly - 1-2 sets - 5-10 reps  GOALS: Goals reviewed with patient? Yes  SHORT TERM GOALS: Target date: 07/23/2024  Pt will be independent with initial HEP in order to improve strength and balance in order to decrease fall risk and improve function at home for ADL's. Baseline: Issued 06/25/24 Goal status: MET  2.  Pt will decrease 5 Time Sit to Stand by at least 3 seconds in order to demonstrate clinically significant improvement in LE strength. Baseline: 24.22 seconds on Eval; 17.07 seconds 07/23/24 Goal status: MET  3.  Assess FGA and update LTG as needed. Baseline: 11/30 on 06/29/24 Goal status: MET   LONG TERM GOALS: Target date: 08/20/2024  Pt will be independent with final HEP in order to improve strength and balance in order to decrease fall risk and improve function at home for  ADL's. Baseline: Issued Goal status: MET  2.  Pt will decrease 5 Time Sit to Stand by at least 3 seconds (from STG measurement) in order to demonstrate clinically significant improvement in LE strength. Baseline: 24.22 seconds on Eval; 17.07 seconds 07/23/24; 14.16 seconds (no  UE support) 08/20/24 Goal status: PROGRESSING  3.  Pt will increase by at least 0.13 m/s in order to demonstrate clinically significant improvement in community ambulation.  Baseline:  0.60 m/sec with SPC on Eval; 0.83 m/sec with [facility] 08/17/24 Goal status: MET  4.  Pt will demonstrate improved R LE foot clearance for safety with gait. Baseline: decreased R LE foot clearance with fatigue on Eval Goal status: MET  5.  Patient will increase Functional Gait Assessment score to > or = 20/30 as to reduce fall risk and improve dynamic gait safety with community ambulation. Baseline: 11/30 on 06/29/24 with SPC use; 15/30 08/20/24 (no SPC use) Goal status: PROGRESSING   LONG TERM GOALS: Target date: 09/03/2024      1.  Pt will decrease 5 Time Sit to Stand by > or = 3 seconds (from STG measurement) in order to demonstrate clinically significant improvement in LE strength. Baseline: 24.22 seconds on Eval; 17.07 seconds 07/23/24 (STG measurement); 14.16 seconds (no UE support) 08/20/24; 13.75 seconds 09/03/24 Goal status: MET      2.  Patient will increase Functional Gait Assessment score to > or = 19/30 as to reduce fall risk and improve dynamic gait safety with community ambulation. Baseline: 11/30 on 06/29/24 with SPC use; 15/30 08/20/24 (no SPC use); 18/30 (no SPC use) 09/03/24 Goal status: IN PROGRESS (goal at least 19/30; achieved 18/30)   ASSESSMENT:  CLINICAL IMPRESSION: Patient was seen today for physical therapy treatment to address LTG's and HEP.  Today is last planned/scheduled physical therapy session.  Reviewed HEP.  Focused session on assessing LTG's (5 time sit to stand and FGA).  Pt scored 13.75 seconds on the 5  time sit to stand test indicating pt is not at increased risk of falls (>15 seconds = increased risk of falls); goal met.  Pt scored 18/30 on FGA indicating pt may be at increased risk of falls (</= 22/30 = risk of falls in elderly); improved from 15/30 on 08/20/24; goal progressing (d/t goal at least 19/30).  Pt reports being pleased with current functional level (pt reports significant improved strength and balance since starting therapy) and pt reports feeling ready to discharge/graduate from therapy today.  Plan to discharge (graduate) pt from OP PT today; pt in agreement.  OBJECTIVE IMPAIRMENTS: Abnormal gait, cardiopulmonary status limiting activity, decreased activity tolerance, decreased balance, decreased coordination, decreased endurance, decreased knowledge of condition, decreased knowledge of use of DME, decreased mobility, difficulty walking, decreased ROM, decreased strength, increased muscle spasms, impaired flexibility, impaired sensation, impaired tone, improper body mechanics, postural dysfunction, and pain.   ACTIVITY LIMITATIONS: carrying, lifting, bending, standing, squatting, stairs, transfers, bathing, and locomotion level  PARTICIPATION LIMITATIONS: cleaning, laundry, shopping, community activity, and occupation  PERSONAL FACTORS: Past/current experiences, Time since onset of injury/illness/exacerbation, and 1-2 comorbidities: MS and CHF are also affecting patient's functional outcome.   REHAB POTENTIAL: Good  CLINICAL DECISION MAKING: Evolving/moderate complexity  EVALUATION COMPLEXITY: Moderate  PLAN:  PT FREQUENCY: 2x/week  PT DURATION: 8 weeks  PLANNED INTERVENTIONS: [id number]- PT Re-evaluation, 97750- Physical Performance Testing, 97110-Therapeutic exercises, 97530- Therapeutic activity, [id number]- Neuromuscular re-education, 97535- Self Care, [id number]- Manual therapy, [id number]- Gait training, [phone]- Orthotic Initial, [phone]- Orthotic/Prosthetic subsequent, [phone]- Aquatic Therapy,  [phone]- Electrical stimulation (manual), [id number]- Ultrasound, [id number] (1-2 muscles), [id number] (3+ muscles)- Dry Needling, Patient/Family education, Balance training, Stair training, Taping, Joint mobilization, Spinal mobilization, Compression bandaging, DME instructions, Cryotherapy, Moist heat, and Biofeedback  PLAN FOR NEXT SESSION: Discharge from OP PT today.   Damien  Jazier Mcglamery, PT 09/04/2024, 10:08 AM        "

## 2024-09-15 NOTE — Progress Notes (Signed)
 " ID:  [name], DOB 1981-03-27, MRN [id number]   Provider location: [facility] Type of Visit: Established patient   PCP:  Amanda Therisa MATSU, PA  HF Cardiologist:  Dr. Rolan.   Chief complaint: CHF   HPI: Amanda [name] is a 44 y.o. who has a history of multiple sclerosis, anemia due to uterine fibroids, and systolic HF (dx 12/2017).   Admitted to [facility] 5/31-01/13/18  with volume overload and found to have acute systolic heart failure. Echo showed EF 10-15%. HF team consulted. R/LHC completed, which showed no CAD and preserved CO. Cardiac MRI showed no LGE, EF 14%. She was diuresed with IV lasix  and transitioned to lasix  40 mg daily. HF meds were optimized. DC weight: 299 lbs.   Echo in 11/18 showed EF 35% with diffuse hypokinesis.    She has been unable to tolerate even low dose Bidil.   Echo in 4/20 showed EF 40%, mild LVH, normal RV, normal IVC.   Echo in 5/21 showed EF 45%, normal RV.   Echo in 5/22 showed EF 40%, diffuse hypokinesis, normal RV, mild MR.   Echo in 10/23 showed EF 40-45%, global hypokinesis, normal RV.   Echo 11/24 EF 35-40%, global hypokinesis, RV normal, IVC normal.   Today she returns for HF follow up. She had to stop Jardiance , felt sick when she was taking it.  She has episodes of sharp chest pain that will last 1-2 minutes, no trigger (not exertional).  No dyspnea with her usual activities, no lightheadedness or palpitations.  Has new neurologist, Dr. Vear, for her MS.  Weight down 4 lbs. She needs surgery for fibroids, has significant bleeding.   ECG (personally reviewed): NSR, LVH  Labs (5/23): K 3.8, creatinine 0.68 Labs (12/23): K 3.6, creatinine 0.69, hgb 7.4 Labs (8/24): K 3.9, creatinine 0.71, hgbA1c 5.4 Labs (12/24): K 3.9, creatinine 0.66 Labs (4/25): K 4.5, creatinine 0.66   PMH:  1. Multiple sclerosis 2. Uterine fibroids 3. Fe deficiency anemia: Likely related to fibroids.  4. Chronic systolic CHF: Diagnosed in  5/19.  Nonischemic cardiomyopathy.  - Echo (6/19) with EF 10-15%.  - RHC/LHC (6/19): mean RA 5, PA 30/13 mean 22, mean PCWP 9, CI 2.63.  No angiographic CAD.  - Cardiac MRI (6/19): EF 14% with severely dilated LV, mildly dilated RV with mildly decreased systolic function, no myocardial LGE.  - Echo (8/19): EF 30%, diffuse hypokinesis, mildly dilated RV with normal systolic function, PASP 40 mmHg.  - Echo (11/19): EF 35%, diffuse hypokinesis, mildly dilated RV with normal systolic function.  - Echo (4/20): EF 40% with mild LV dilation and mild LVH, normal RV, normal IVC.  - Echo (5/21): EF 45%, mild LVH, normal RV, normal IVC - Echo (5/22): EF 40%, diffuse hypokinesis, normal RV, mild MR. - Echo (10/23): EF 40-45%, global hypokinesis, normal RV.  - Echo (11/24): EF 35-40%, global hypokinesis, RV normal, IVC normal.  5. Sleep study (9/19): No OSA.  6. BPPV 7. COVID-19 12/22  Past Surgical History:  Procedure Laterality Date   CARDIAC CATHETERIZATION  01/12/2018   Current Outpatient Medications  Medication Sig Dispense Refill   acetaminophen  (TYLENOL ) 500 MG tablet Take 1,000 mg by mouth 2 (two) times daily as needed for mild pain, moderate pain, fever or headache.     baclofen  (LIORESAL ) 10 MG tablet 1/2 to 1 po tid 90 each 11   carvedilol  (COREG ) 12.5 MG tablet Take 1.5 tablets (18.75 mg total) by mouth 2 (  two) times daily with a meal. 180 tablet 3   Cholecalciferol (VITAMIN D -3 PO) Take 1 capsule by mouth in the morning.     Cladribine, 10 Tabs, (MAVENCLAD, 10 TABS,) 10 MG TBPK Take 20 mg by mouth daily.     ferrous sulfate  325 (65 FE) MG tablet Take 1 tablet (325 mg total) by mouth 2 (two) times daily with a meal. 60 tablet 3   gabapentin  (NEURONTIN ) 300 MG capsule Take 1 capsule (300 mg total) by mouth 3 (three) times daily. 90 capsule 11   levETIRAcetam  (KEPPRA ) 750 MG tablet Take 1 tablet (750 mg total) by mouth 2 (two) times daily. (Patient not taking: Reported on 06/18/2024) 60  tablet 11   sacubitril -valsartan  (ENTRESTO ) 97-103 MG Take 1 tablet by mouth 2 (two) times daily. 180 tablet 3   spironolactone  (ALDACTONE ) 25 MG tablet Take 1 tablet (25 mg total) by mouth at bedtime. 90 tablet 3   No current facility-administered medications for this visit.   Allergies:   Patient has no known allergies.   Social History:  The patient  reports that she has never smoked. She has never used smokeless tobacco. She reports that she does not drink alcohol and does not use drugs.   Family History:  The patient's family history includes Diabetes in her father and mother; Fibroids in her sister; Heart disease in her mother; Hypertension in her mother; Multiple sclerosis in her cousin and cousin; Obesity in her mother; Stroke in her mother.   ROS:  Please see the history of present illness.   All other systems are personally reviewed and negative.   Recent Labs: 06/18/2024: ALT 11; B Natriuretic Peptide 71.3; BUN 9; Creatinine, Ser 0.69; Potassium 4.0; Sodium 136 07/05/2024: Hemoglobin 7.0; Platelets 226  Personally reviewed   Wt Readings from Last 3 Encounters:  06/18/24 (!) 148.7 kg (327 lb 12.8 oz)  11/18/23 (!) 149.7 kg (330 lb)  10/14/23 (!) 147.4 kg (325 lb)    There were no vitals taken for this visit.  Physical Exam General: NAD Neck: No JVD, no thyromegaly or thyroid  nodule.  Lungs: Clear to auscultation bilaterally with normal respiratory effort. CV: Nondisplaced PMI.  Heart regular S1/S2, no S3/S4, no murmur.  No peripheral edema.  No carotid bruit.  Normal pedal pulses.  Abdomen: Soft, nontender, no hepatosplenomegaly, no distention.  Skin: Intact without lesions or rashes.  Neurologic: Alert and oriented x 3.  Psych: Normal affect. Extremities: No clubbing or cyanosis.  HEENT: Normal.   ASSESSMENT AND PLAN: 1. Chronic systolic CHF: Echo 5/19 with EF 10-15%, moderate MR. Nonischemic cardiomyopathy with no coronary disease on 6/19 cath. 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 Mother developed CHF in her 82s but no other family members have CHF that she knows of (has several healthy siblings).  She does not drink ETOH or use drugs.  No children, has not been pregnant so not peri-partum CMP.  TSH normal.  RHC 01/12/18 with normal filling pressures and preserved CO. Cardiac MRI 01/12/18 did not show LGE, EF 14%. Repeat echo 11/19 showed EF up to 35%.  Echo in 4/20 showed EF 40%.  Echo in 5/21 showed EF up to 45%.  Echo in 5/22 showed EF 40%.  Echo in 10/23 showed EF 40-45%, global hypokinesis, normal RV.  Echo 11/24 showed EF 35-40%, global hypokinesis, RV normal, IVC normal.  NYHA class I-II, not volume overloaded on exam.  - Continue Entresto  97/103 bid. BMET/BNP today.  - Increase Coreg  to 18.75 mg  bid.  - Continue spironolactone  25 mg daily.  - She has not tolerate Jardiance  or Farxiga .   - She did not tolerate even low dose Bidil.   - EF was out of ICD range on last echo, will arrange for repeeat echo.  - Discussed fetotoxicity of GDMT. She is married but was told she would be unable to conceive. 2. Obesity: There is no height or weight on file to calculate BMI. - I will refer to pharmacy clinic to see if she can get GLP-1 agonist.  3.  Fe deficiency anemia: Due to bleeding from uterine fibroids.  - CBC today.  - Hysterectomy has been recommended.  She will be moderate risk for anesthesia but not prohibitive.  4. Chest pain: Pain atypical. ECG OK today. Suspect MSK vs stress-related. 5. MS: Now seeing local neurologist (used to go to [place]).   Follow up in 3 months with APP  I spent 32 minutes reviewing records, interviewing/examining patient, and managing orders.    Bonney Harlene HERO [facility] FNP-BC 09/15/2024  [facility] [address] [facility] [place] [address] [phone] (office) [phone] (fax) "

## 2024-09-16 ENCOUNTER — Telehealth (HOSPITAL_COMMUNITY): Payer: Self-pay

## 2024-09-16 NOTE — Telephone Encounter (Signed)
 Called to confirm/remind patient of their appointment at the [facility] on 09/17/24.   Appointment:   [] Confirmed  [x] Left mess   [] No answer/No voice mail  [] VM Full/unable to leave message  [] Phone not in service  And to bring in all medications and/or complete list.

## 2024-09-17 ENCOUNTER — Ambulatory Visit (HOSPITAL_COMMUNITY): Admission: RE | Admit: 2024-09-17 | Source: Ambulatory Visit

## 2024-09-17 ENCOUNTER — Ambulatory Visit (HOSPITAL_COMMUNITY): Payer: Self-pay | Admitting: Family Medicine

## 2024-09-17 ENCOUNTER — Encounter (HOSPITAL_COMMUNITY): Payer: Self-pay

## 2024-09-17 VITALS — BP 126/78 | HR 98 | Wt 326.0 lb

## 2024-09-17 DIAGNOSIS — I5022 Chronic systolic (congestive) heart failure: Secondary | ICD-10-CM

## 2024-09-17 DIAGNOSIS — D508 Other iron deficiency anemias: Secondary | ICD-10-CM

## 2024-09-17 DIAGNOSIS — G35D Multiple sclerosis, unspecified: Secondary | ICD-10-CM

## 2024-09-17 LAB — BASIC METABOLIC PANEL WITH GFR
Anion gap: 7 (ref 5–15)
BUN: 11 mg/dL (ref 6–20)
CO2: 26 mmol/L (ref 22–32)
Calcium: 9.2 mg/dL (ref 8.9–10.3)
Chloride: 106 mmol/L (ref 98–111)
Creatinine, Ser: 0.75 mg/dL (ref 0.44–1.00)
GFR, Estimated: >60 mL/min
Glucose, Bld: 76 mg/dL (ref 70–99)
Potassium: 4.5 mmol/L (ref 3.5–5.1)
Sodium: 140 mmol/L (ref 135–145)

## 2024-09-17 MED ORDER — CARVEDILOL 25 MG PO TABS: 25.0000 mg | ORAL_TABLET | Freq: Two times a day (BID) | ORAL | 6 refills | Status: AC

## 2024-09-17 NOTE — Patient Instructions (Addendum)
 Good to see you today!  INCREASE coreg  to 25 mg ( 1 tablet)Twice daily  Labs done today, your results will be available in MyChart, we will contact you for abnormal readings.  Your physician recommends that you schedule a follow-up appointment  6 months(August) call office in June to schedule an appointment  If you have any questions or concerns before your next appointment please send us  a message through [place] or call our office at [phone].    TO LEAVE A MESSAGE FOR THE NURSE SELECT OPTION 2, PLEASE LEAVE A MESSAGE INCLUDING: YOUR NAME DATE OF BIRTH CALL BACK NUMBER REASON FOR CALL**this is important as we prioritize the call backs  YOU WILL RECEIVE A CALL BACK THE SAME DAY AS LONG AS YOU CALL BEFORE 4:00 PM  At the [facility], you and your health needs are our priority. As part of our continuing mission to provide you with exceptional heart care, we have created designated Provider Care Teams. These Care Teams include your primary Cardiologist (physician) and Advanced Practice Providers (APPs- Physician Assistants and Nurse Practitioners) who all work together to provide you with the care you need, when you need it.   You may see any of the following providers on your designated Care Team at your next follow up: Dr Toribio Fuel Dr Ezra Shuck Dr. Morene Brownie Greig Mosses, NP Caffie Shed, [place] [facility], [place] Beckey Coe, NP Jordan Lee, NP Ellouise Class, NP Tinnie Redman, PharmD Jaun Bash, PharmD   Please be sure to bring in all your medications bottles to every appointment.    Thank you for choosing [facility]

## 2024-11-09 ENCOUNTER — Ambulatory Visit: Admitting: Neurology

## 2025-01-18 ENCOUNTER — Ambulatory Visit: Admitting: Neurology
